# Patient Record
Sex: Female | Born: 1990
Health system: Southern US, Community
[De-identification: ages and names within clinical notes are randomized; demographics above are authoritative.]

## PROBLEM LIST (undated history)

## (undated) DIAGNOSIS — R519 Headache, unspecified: Secondary | ICD-10-CM

## (undated) DIAGNOSIS — R42 Dizziness and giddiness: Secondary | ICD-10-CM

## (undated) DIAGNOSIS — Z8619 Personal history of other infectious and parasitic diseases: Secondary | ICD-10-CM

## (undated) DIAGNOSIS — E282 Polycystic ovarian syndrome: Secondary | ICD-10-CM

## (undated) DIAGNOSIS — E039 Hypothyroidism, unspecified: Secondary | ICD-10-CM

## (undated) DIAGNOSIS — K589 Irritable bowel syndrome without diarrhea: Secondary | ICD-10-CM

## (undated) DIAGNOSIS — N390 Urinary tract infection, site not specified: Secondary | ICD-10-CM

## (undated) DIAGNOSIS — K52839 Microscopic colitis, unspecified: Secondary | ICD-10-CM

## (undated) DIAGNOSIS — K760 Fatty (change of) liver, not elsewhere classified: Secondary | ICD-10-CM

## (undated) HISTORY — DX: Polycystic ovarian syndrome: E28.2

## (undated) HISTORY — DX: Fatty (change of) liver, not elsewhere classified: K76.0

## (undated) HISTORY — DX: Personal history of other infectious and parasitic diseases: Z86.19

## (undated) HISTORY — DX: Microscopic colitis, unspecified: K52.839

## (undated) HISTORY — DX: Irritable bowel syndrome, unspecified: K58.9

## (undated) HISTORY — PX: TONSILLECTOMY: SUR1361

## (undated) HISTORY — PX: WISDOM TOOTH EXTRACTION: SHX21

---
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
## 2010-04-11 ENCOUNTER — Emergency Department (HOSPITAL_COMMUNITY): Admission: EM | Admit: 2010-04-11 | Discharge: 2010-04-11 | Payer: Self-pay | Admitting: Emergency Medicine

## 2011-03-21 LAB — DIFFERENTIAL
Basophils Absolute: 0 10*3/uL (ref 0.0–0.1)
Basophils Relative: 0 % (ref 0–1)
Eosinophils Absolute: 0.1 10*3/uL (ref 0.0–0.7)
Monocytes Relative: 5 % (ref 3–12)
Neutrophils Relative %: 89 % — ABNORMAL HIGH (ref 43–77)

## 2011-03-21 LAB — URINALYSIS, ROUTINE W REFLEX MICROSCOPIC
Bilirubin Urine: NEGATIVE
Glucose, UA: NEGATIVE mg/dL
Ketones, ur: NEGATIVE mg/dL
Leukocytes, UA: NEGATIVE
Nitrite: NEGATIVE
Specific Gravity, Urine: 1.027 (ref 1.005–1.030)
pH: 5 (ref 5.0–8.0)

## 2011-03-21 LAB — GC/CHLAMYDIA PROBE AMP, GENITAL: Chlamydia, DNA Probe: NEGATIVE

## 2011-03-21 LAB — POCT PREGNANCY, URINE: Preg Test, Ur: NEGATIVE

## 2011-03-21 LAB — COMPREHENSIVE METABOLIC PANEL
ALT: 20 U/L (ref 0–35)
Alkaline Phosphatase: 57 U/L (ref 39–117)
BUN: 10 mg/dL (ref 6–23)
CO2: 27 mEq/L (ref 19–32)
Chloride: 106 mEq/L (ref 96–112)
GFR calc non Af Amer: >60 mL/min (ref >60)
Glucose, Bld: 89 mg/dL (ref 70–99)
Potassium: 4 mEq/L (ref 3.5–5.1)
Sodium: 139 mEq/L (ref 135–145)
Total Bilirubin: 0.6 mg/dL (ref 0.3–1.2)
Total Protein: 7.2 g/dL (ref 6.0–8.3)

## 2011-03-21 LAB — CBC
HCT: 40.7 % (ref 36.0–46.0)
Hemoglobin: 14 g/dL (ref 12.0–15.0)
RBC: 4.47 MIL/uL (ref 3.87–5.11)
WBC: 10.1 10*3/uL (ref 4.0–10.5)

## 2011-03-21 LAB — URINE MICROSCOPIC-ADD ON

## 2011-11-20 LAB — OB RESULTS CONSOLE ABO/RH: RH Type: POSITIVE

## 2011-11-20 LAB — OB RESULTS CONSOLE HIV ANTIBODY (ROUTINE TESTING): HIV: NONREACTIVE

## 2011-11-20 LAB — OB RESULTS CONSOLE RPR: RPR: NONREACTIVE

## 2011-12-05 LAB — OB RESULTS CONSOLE GC/CHLAMYDIA
Chlamydia: NEGATIVE
Gonorrhea: NEGATIVE

## 2012-01-01 NOTE — L&D Delivery Note (Signed)
Delivery Note At 9:59 PM a viable female was delivered via Vaginal, Spontaneous Delivery (Presentation: Right Occiput Posterior).  APGAR: 8, 9; weight P.   Placenta status: Intact, Spontaneous.  Cord: 3 vessels with the following complications: None.    Anesthesia: General  Episiotomy: None Lacerations: Labial Suture Repair: 3.0 vicryl rapide Est. Blood Loss (mL):   Mom to postpartum.  Baby to nursery-stable.  [name],Joshus Rogan 05/09/2012, 10:51 PM   A+/Br/ POPs

## 2012-04-22 LAB — OB RESULTS CONSOLE GBS: GBS: POSITIVE

## 2012-05-08 ENCOUNTER — Encounter (HOSPITAL_COMMUNITY): Payer: Self-pay | Admitting: *Deleted

## 2012-05-08 ENCOUNTER — Inpatient Hospital Stay (HOSPITAL_COMMUNITY)
Admission: AD | Admit: 2012-05-08 | Discharge: 2012-05-08 | Disposition: A | Discharge status: Home / Self Care | Payer: 59 | Source: Ambulatory Visit | Attending: Obstetrics and Gynecology | Admitting: Obstetrics and Gynecology

## 2012-05-08 ENCOUNTER — Inpatient Hospital Stay (HOSPITAL_COMMUNITY)
Admission: AD | Admit: 2012-05-08 | Discharge: 2012-05-09 | Disposition: A | Discharge status: Home / Self Care | Payer: 59 | Source: Ambulatory Visit | Attending: Obstetrics and Gynecology | Admitting: Obstetrics and Gynecology

## 2012-05-08 DIAGNOSIS — O99892 Other specified diseases and conditions complicating childbirth: Secondary | ICD-10-CM | POA: Diagnosis present

## 2012-05-08 DIAGNOSIS — Z22330 Carrier of Group B streptococcus: Secondary | ICD-10-CM

## 2012-05-08 DIAGNOSIS — O479 False labor, unspecified: Secondary | ICD-10-CM | POA: Insufficient documentation

## 2012-05-08 HISTORY — DX: Dizziness and giddiness: R42

## 2012-05-08 HISTORY — DX: Urinary tract infection, site not specified: N39.0

## 2012-05-08 LAB — URINALYSIS, ROUTINE W REFLEX MICROSCOPIC
Glucose, UA: NEGATIVE mg/dL
Ketones, ur: 40 mg/dL — AB
Protein, ur: NEGATIVE mg/dL
Urobilinogen, UA: 4 mg/dL — ABNORMAL HIGH (ref 0.0–1.0)

## 2012-05-08 LAB — URINE MICROSCOPIC-ADD ON

## 2012-05-08 MED ORDER — NITROFURANTOIN MONOHYD MACRO 100 MG PO CAPS
100.0000 mg | ORAL_CAPSULE | Freq: Once | ORAL | Status: AC
  Administered 2012-05-08: 100 mg via ORAL
  Filled 2012-05-08: qty 1

## 2012-05-08 MED ORDER — SULFAMETHOXAZOLE-TMP DS 800-160 MG PO TABS
1.0000 | ORAL_TABLET | Freq: Once | ORAL | Status: AC
  Administered 2012-05-08: 1 via ORAL
  Filled 2012-05-08: qty 1

## 2012-05-08 MED ORDER — OXYCODONE-ACETAMINOPHEN 5-325 MG PO TABS
1.0000 | ORAL_TABLET | Freq: Once | ORAL | Status: AC
  Administered 2012-05-08: 1 via ORAL
  Filled 2012-05-08: qty 1

## 2012-05-08 NOTE — MAU Note (Signed)
Pt. Vomited after Bactrim was given.

## 2012-05-08 NOTE — MAU Note (Signed)
Lost mucous plug last wk.  Was one cm on Mon.  Contracting through the night. Past hour 2-3 min.

## 2012-05-08 NOTE — Discharge Instructions (Signed)
Fetal Movement Counts Patient Name: ____[name]______________________________________________ Patient Due Date: ____5/25/13________________ Melody Haver counts is highly recommended in high risk pregnancies, but it is a good idea for every pregnant woman to do. Start counting fetal movements at 28 weeks of the pregnancy. Fetal movements increase after eating a full meal or eating or drinking something sweet (the blood sugar is higher). It is also important to drink plenty of fluids (well hydrated) before doing the count. Lie on your left side because it helps with the circulation or you can sit in a comfortable chair with your arms over your belly (abdomen) with no distractions around you. DOING THE COUNT  Try to do the count the same time of day each time you do it.   Mark the day and time, then see how long it takes for you to feel 10 movements (kicks, flutters, swishes, rolls). You should have at least 10 movements within 2 hours. You will most likely feel 10 movements in much less than 2 hours. If you do not, wait an hour and count again. After a couple of days you will see a pattern.   What you are looking for is a change in the pattern or not enough counts in 2 hours. Is it taking longer in time to reach 10 movements?  SEEK MEDICAL CARE IF:  You feel less than 10 counts in 2 hours. Tried twice.   No movement in one hour.   The pattern is changing or taking longer each day to reach 10 counts in 2 hours.   You feel the baby is not moving as it usually does.  Date: ____________ Movements: ____________ Start time: ____________ [name] time: ____________  Date: ____________ Movements: ____________ Start time: ____________ [name] time: ____________ Date: ____________ Movements: ____________ Start time: ____________ [name] time: ____________ Date: ____________ Movements: ____________ Start time: ____________ [name] time: ____________ Date: ____________ Movements: ____________ Start time:  ____________ [name] time: ____________ Date: ____________ Movements: ____________ Start time: ____________ [name] time: ____________ Date: ____________ Movements: ____________ Start time: ____________ [name] time: ____________ Date: ____________ Movements: ____________ Start time: ____________ [name] time: ____________  Date: ____________ Movements: ____________ Start time: ____________ [name] time: ____________ Date: ____________ Movements: ____________ Start time: ____________ [name] time: ____________ Date: ____________ Movements: ____________ Start time: ____________ [name] time: ____________ Date: ____________ Movements: ____________ Start time: ____________ [name] time: ____________ Date: ____________ Movements: ____________ Start time: ____________ [name] time: ____________ Date: ____________ Movements: ____________ Start time: ____________ [name] time: ____________ Date: ____________ Movements: ____________ Start time: ____________ [name] time: ____________  Date: ____________ Movements: ____________ Start time: ____________ [name] time: ____________ Date: ____________ Movements: ____________ Start time: ____________ [name] time: ____________ Date: ____________ Movements: ____________ Start time: ____________ [name] time: ____________ Date: ____________ Movements: ____________ Start time: ____________ [name] time: ____________ Date: ____________ Movements: ____________ Start time: ____________ [name] time: ____________ Date: ____________ Movements: ____________ Start time: ____________ [name] time: ____________ Date: ____________ Movements: ____________ Start time: ____________ [name] time: ____________  Date: ____________ Movements: ____________ Start time: ____________ [name] time: ____________ Date: ____________ Movements: ____________ Start time: ____________ [name] time: ____________ Date: ____________ Movements: ____________ Start time: ____________ [name] time: ____________ Date:  ____________ Movements: ____________ Start time: ____________ [name] time: ____________ Date: ____________ Movements: ____________ Start time: ____________ [name] time: ____________ Date: ____________ Movements: ____________ Start time: ____________ [name] time: ____________ Date: ____________ Movements: ____________ Start time: ____________ [name] time: ____________  Date: ____________ Movements: ____________ Start time: ____________ [name] time: ____________ Date: ____________ Movements: ____________ Start time: ____________ [name] time: ____________ Date: ____________ Movements: ____________ Start  time: ____________ [name] time: ____________ Date: ____________ Movements: ____________ Start time: ____________ [name] time: ____________ Date: ____________ Movements: ____________ Start time: ____________ [name] time: ____________ Date: ____________ Movements: ____________ Start time: ____________ [name] time: ____________ Date: ____________ Movements: ____________ Start time: ____________ [name] time: ____________  Date: ____________ Movements: ____________ Start time: ____________ [name] time: ____________ Date: ____________ Movements: ____________ Start time: ____________ [name] time: ____________ Date: ____________ Movements: ____________ Start time: ____________ [name] time: ____________ Date: ____________ Movements: ____________ Start time: ____________ [name] time: ____________ Date: ____________ Movements: ____________ Start time: ____________ [name] time: ____________ Date: ____________ Movements: ____________ Start time: ____________ [name] time: ____________ Date: ____________ Movements: ____________ Start time: ____________ [name] time: ____________  Date: ____________ Movements: ____________ Start time: ____________ [name] time: ____________ Date: ____________ Movements: ____________ Start time: ____________ [name] time: ____________ Date: ____________ Movements: ____________ Start  time: ____________ [name] time: ____________ Date: ____________ Movements: ____________ Start time: ____________ [name] time: ____________ Date: ____________ Movements: ____________ Start time: ____________ [name] time: ____________ Date: ____________ Movements: ____________ Start time: ____________ [name] time: ____________ Date: ____________ Movements: ____________ Start time: ____________ [name] time: ____________  Date: ____________ Movements: ____________ Start time: ____________ [name] time: ____________ Date: ____________ Movements: ____________ Start time: ____________ [name] time: ____________ Date: ____________ Movements: ____________ Start time: ____________ [name] time: ____________ Date: ____________ Movements: ____________ Start time: ____________ [name] time: ____________ Date: ____________ Movements: ____________ Start time: ____________ [name] time: ____________ Date: ____________ Movements: ____________ Start time: ____________ [name] time: ____________ Document Released: 01/16/2007 Document Revised: 12/06/2011 Document Reviewed: 07/19/2009 [employer] Patient Information 2012 [employer].Normal Labor and Delivery Your caregiver must first be sure you are in labor. Signs of labor include:  You may pass what is called "the mucus plug" before labor begins. This is a small amount of blood stained mucus.   Regular uterine contractions.   The time between contractions get closer together.   The discomfort and pain gradually gets more intense.   Pains are mostly located in the back.   Pains get worse when walking.   The cervix (the opening of the uterus becomes thinner (begins to efface) and opens up (dilates).  Once you are in labor and admitted into the hospital or care center, your caregiver will do the following:  A complete physical examination.   Check your vital signs (blood pressure, pulse, temperature and the fetal heart rate).   Do a vaginal examination (using  a sterile glove and lubricant) to determine:   The position (presentation) of the baby (head [vertex] or buttock first).   The level (station) of the baby's head in the birth canal.   The effacement and dilatation of the cervix.   You may have your pubic hair shaved and be given an enema depending on your caregiver and the circumstance.   An electronic monitor is usually placed on your abdomen. The monitor follows the length and intensity of the contractions, as well as the baby's heart rate.   Usually, your caregiver will insert an IV in your arm with a bottle of sugar water. This is done as a precaution so that medications can be given to you quickly during labor or delivery.  NORMAL LABOR AND DELIVERY IS DIVIDED UP INTO 3 STAGES: First Stage This is when regular contractions begin and the cervix begins to efface and dilate. This stage can last from 3 to 15 hours. The end of the first stage is when the cervix is 100% effaced and 10 centimeters dilated. Pain medications may be given by   Injection (morphine,  demerol, etc.)   Regional anesthesia (spinal, caudal or epidural, anesthetics given in different locations of the spine). Paracervical pain medication may be given, which is an injection of and anesthetic on each side of the cervix.  A pregnant woman may request to have "Natural Childbirth" which is not to have any medications or anesthesia during her labor and delivery. Second Stage This is when the baby comes down through the birth canal (vagina) and is born. This can take 1 to 4 hours. As the baby's head comes down through the birth canal, you may feel like you are going to have a bowel movement. You will get the urge to bear down and push until the baby is delivered. As the baby's head is being delivered, the caregiver will decide if an episiotomy (a cut in the perineum and vagina area) is needed to prevent tearing of the tissue in this area. The episiotomy is sewn up after the  delivery of the baby and placenta. Sometimes a mask with nitrous oxide is given for the mother to breath during the delivery of the baby to help if there is too much pain. The end of Stage 2 is when the baby is fully delivered. Then when the umbilical cord stops pulsating it is clamped and cut. Third Stage The third stage begins after the baby is completely delivered and ends after the placenta (afterbirth) is delivered. This usually takes 5 to 30 minutes. After the placenta is delivered, a medication is given either by intravenous or injection to help contract the uterus and prevent bleeding. The third stage is not painful and pain medication is usually not necessary. If an episiotomy was done, it is repaired at this time. After the delivery, the mother is watched and monitored closely for 1 to 2 hours to make sure there is no postpartum bleeding (hemorrhage). If there is a lot of bleeding, medication is given to contract the uterus and stop the bleeding. Document Released: 09/25/2008 Document Revised: 12/06/2011 Document Reviewed: 09/25/2008 [facility] Patient Information 2012 [place], [place].

## 2012-05-09 ENCOUNTER — Encounter (HOSPITAL_COMMUNITY): Payer: Self-pay | Admitting: Anesthesiology

## 2012-05-09 ENCOUNTER — Encounter (HOSPITAL_COMMUNITY): Payer: Self-pay | Admitting: Obstetrics and Gynecology

## 2012-05-09 ENCOUNTER — Encounter (HOSPITAL_COMMUNITY): Payer: Self-pay | Admitting: *Deleted

## 2012-05-09 ENCOUNTER — Inpatient Hospital Stay (HOSPITAL_COMMUNITY)
Admission: AD | Admit: 2012-05-09 | Discharge: 2012-05-11 | DRG: 775 | Disposition: A | Discharge status: Home / Self Care | Payer: 59 | Source: Ambulatory Visit | Attending: Obstetrics and Gynecology | Admitting: Obstetrics and Gynecology

## 2012-05-09 ENCOUNTER — Inpatient Hospital Stay (HOSPITAL_COMMUNITY): Payer: 59 | Admitting: Anesthesiology

## 2012-05-09 LAB — CBC
HCT: 37 % (ref 36.0–46.0)
Hemoglobin: 12.5 g/dL (ref 12.0–15.0)
MCHC: 33.8 g/dL (ref 30.0–36.0)
RBC: 4.13 MIL/uL (ref 3.87–5.11)

## 2012-05-09 MED ORDER — ONDANSETRON HCL 4 MG/2ML IJ SOLN: 4.0000 mg | Freq: Four times a day (QID) | INTRAMUSCULAR | Status: DC | PRN

## 2012-05-09 MED ORDER — BUTORPHANOL TARTRATE 2 MG/ML IJ SOLN
1.0000 mg | INTRAMUSCULAR | Status: DC | PRN
  Administered 2012-05-09: 1 mg via INTRAVENOUS
  Filled 2012-05-09: qty 1

## 2012-05-09 MED ORDER — DIPHENHYDRAMINE HCL 50 MG/ML IJ SOLN: 12.5000 mg | INTRAMUSCULAR | Status: DC | PRN

## 2012-05-09 MED ORDER — BUTORPHANOL TARTRATE 2 MG/ML IJ SOLN
2.0000 mg | Freq: Once | INTRAMUSCULAR | Status: AC
  Administered 2012-05-09: 2 mg via INTRAMUSCULAR
  Filled 2012-05-09: qty 1

## 2012-05-09 MED ORDER — BUTORPHANOL TARTRATE 2 MG/ML IJ SOLN: 2.0000 mg | Freq: Once | INTRAMUSCULAR | Status: DC

## 2012-05-09 MED ORDER — OXYTOCIN BOLUS FROM INFUSION
500.0000 mL | Freq: Once | INTRAVENOUS | Status: DC
  Filled 2012-05-09: qty 500

## 2012-05-09 MED ORDER — FENTANYL 2.5 MCG/ML BUPIVACAINE 1/10 % EPIDURAL INFUSION (WH - ANES)
INTRAMUSCULAR | Status: DC | PRN
  Administered 2012-05-09: 14 mL/h via EPIDURAL

## 2012-05-09 MED ORDER — FENTANYL 2.5 MCG/ML BUPIVACAINE 1/10 % EPIDURAL INFUSION (WH - ANES)
14.0000 mL/h | INTRAMUSCULAR | Status: DC
  Administered 2012-05-09 (×2): 14 mL/h via EPIDURAL
  Filled 2012-05-09 (×2): qty 60

## 2012-05-09 MED ORDER — PHENYLEPHRINE 40 MCG/ML (10ML) SYRINGE FOR IV PUSH (FOR BLOOD PRESSURE SUPPORT)
80.0000 ug | PREFILLED_SYRINGE | INTRAVENOUS | Status: AC | PRN
  Administered 2012-05-09 (×3): 80 ug via INTRAVENOUS
  Filled 2012-05-09 (×2): qty 5

## 2012-05-09 MED ORDER — LACTATED RINGERS IV SOLN
500.0000 mL | INTRAVENOUS | Status: DC | PRN
  Administered 2012-05-09: 500 mL via INTRAVENOUS

## 2012-05-09 MED ORDER — LIDOCAINE HCL (PF) 1 % IJ SOLN
INTRAMUSCULAR | Status: DC | PRN
  Administered 2012-05-09 (×2): 4 mL

## 2012-05-09 MED ORDER — PHENYLEPHRINE 40 MCG/ML (10ML) SYRINGE FOR IV PUSH (FOR BLOOD PRESSURE SUPPORT)
80.0000 ug | PREFILLED_SYRINGE | INTRAVENOUS | Status: DC | PRN
  Administered 2012-05-09: 120 ug via INTRAVENOUS

## 2012-05-09 MED ORDER — ACETAMINOPHEN 325 MG PO TABS
650.0000 mg | ORAL_TABLET | ORAL | Status: DC | PRN
  Administered 2012-05-09: 650 mg via ORAL
  Filled 2012-05-09: qty 2

## 2012-05-09 MED ORDER — TERBUTALINE SULFATE 1 MG/ML IJ SOLN: 0.2500 mg | Freq: Once | INTRAMUSCULAR | Status: AC | PRN

## 2012-05-09 MED ORDER — CEFAZOLIN SODIUM 1-5 GM-% IV SOLN: 1.0000 g | Freq: Three times a day (TID) | INTRAVENOUS | Status: DC

## 2012-05-09 MED ORDER — OXYTOCIN 20 UNITS IN LACTATED RINGERS INFUSION - SIMPLE
125.0000 mL/h | Freq: Once | INTRAVENOUS | Status: AC
  Administered 2012-05-09: 999 mL/h via INTRAVENOUS

## 2012-05-09 MED ORDER — OXYCODONE-ACETAMINOPHEN 5-325 MG PO TABS: 1.0000 | ORAL_TABLET | ORAL | Status: DC | PRN

## 2012-05-09 MED ORDER — LACTATED RINGERS IV SOLN
INTRAVENOUS | Status: DC
  Administered 2012-05-09: 20:00:00 via INTRAVENOUS

## 2012-05-09 MED ORDER — CEFAZOLIN SODIUM-DEXTROSE 2-3 GM-% IV SOLR
2.0000 g | Freq: Three times a day (TID) | INTRAVENOUS | Status: DC
  Administered 2012-05-09 (×2): 2 g via INTRAVENOUS
  Filled 2012-05-09 (×4): qty 50

## 2012-05-09 MED ORDER — EPHEDRINE 5 MG/ML INJ: 10.0000 mg | INTRAVENOUS | Status: DC | PRN

## 2012-05-09 MED ORDER — FLEET ENEMA 7-19 GM/118ML RE ENEM: 1.0000 | ENEMA | RECTAL | Status: DC | PRN

## 2012-05-09 MED ORDER — LACTATED RINGERS IV SOLN
500.0000 mL | Freq: Once | INTRAVENOUS | Status: AC
  Administered 2012-05-09: 16:00:00 via INTRAVENOUS

## 2012-05-09 MED ORDER — PHENYLEPHRINE 40 MCG/ML (10ML) SYRINGE FOR IV PUSH (FOR BLOOD PRESSURE SUPPORT)
PREFILLED_SYRINGE | INTRAVENOUS | Status: AC
  Administered 2012-05-09: 80 ug via INTRAVENOUS
  Filled 2012-05-09: qty 5

## 2012-05-09 MED ORDER — EPHEDRINE 5 MG/ML INJ
10.0000 mg | INTRAVENOUS | Status: DC | PRN
  Filled 2012-05-09: qty 4

## 2012-05-09 MED ORDER — LIDOCAINE HCL (PF) 1 % IJ SOLN
30.0000 mL | INTRAMUSCULAR | Status: DC | PRN
  Filled 2012-05-09: qty 30

## 2012-05-09 MED ORDER — OXYTOCIN 20 UNITS IN LACTATED RINGERS INFUSION - SIMPLE
1.0000 m[IU]/min | INTRAVENOUS | Status: DC
  Administered 2012-05-09: 2 m[IU]/min via INTRAVENOUS
  Filled 2012-05-09: qty 1000

## 2012-05-09 MED ORDER — CEFAZOLIN SODIUM-DEXTROSE 2-3 GM-% IV SOLR: 2.0000 g | Freq: Once | INTRAVENOUS | Status: DC

## 2012-05-09 MED ORDER — IBUPROFEN 600 MG PO TABS: 600.0000 mg | ORAL_TABLET | Freq: Four times a day (QID) | ORAL | Status: DC | PRN

## 2012-05-09 MED ORDER — CITRIC ACID-SODIUM CITRATE 334-500 MG/5ML PO SOLN: 30.0000 mL | ORAL | Status: DC | PRN

## 2012-05-09 MED ORDER — BUTORPHANOL TARTRATE 2 MG/ML IJ SOLN: 2.0000 mg | INTRAMUSCULAR | Status: DC | PRN

## 2012-05-09 NOTE — Anesthesia Preprocedure Evaluation (Signed)
Anesthesia Evaluation  Patient identified by MRN, date of birth, ID band Patient awake    Reviewed: Allergy & Precautions, H&P , Patient's Chart, lab work & pertinent test results  Airway Mallampati: II TM Distance: >3 FB Neck ROM: full    Dental No notable dental hx. (+) Teeth Intact   Pulmonary neg pulmonary ROS,  breath sounds clear to auscultation  Pulmonary exam normal       Cardiovascular negative cardio ROS  Rhythm:regular Rate:Normal     Neuro/Psych negative neurological ROS  negative psych ROS   GI/Hepatic negative GI ROS, Neg liver ROS,   Endo/Other  negative endocrine ROS  Renal/GU negative Renal ROS  negative genitourinary   Musculoskeletal   Abdominal Normal abdominal exam  (+)   Peds  Hematology negative hematology ROS (+)   Anesthesia Other Findings   Reproductive/Obstetrics (+) Pregnancy                           Anesthesia Physical Anesthesia Plan  ASA: II  Anesthesia Plan: Epidural   Post-op Pain Management:    Induction:   Airway Management Planned:   Additional Equipment:   Intra-op Plan:   Post-operative Plan:   Informed Consent: I have reviewed the patients History and Physical, chart, labs and discussed the procedure including the risks, benefits and alternatives for the proposed anesthesia with the patient or authorized representative who has indicated his/her understanding and acceptance.     Plan Discussed with: Anesthesiologist  Anesthesia Plan Comments:         Anesthesia Quick Evaluation  

## 2012-05-09 NOTE — Anesthesia Procedure Notes (Signed)
Epidural Patient location during procedure: OB Start time: 05/09/2012 3:41 PM  Staffing Anesthesiologist: Glennie Rodda A. Performed by: anesthesiologist   Preanesthetic Checklist Completed: patient identified, site marked, surgical consent, pre-op evaluation, timeout performed, IV checked, risks and benefits discussed and monitors and equipment checked  Epidural Patient position: sitting Prep: site prepped and draped and DuraPrep Patient monitoring: continuous pulse ox and blood pressure Approach: midline Injection technique: LOR air  Needle:  Needle type: Tuohy  Needle gauge: 17 G Needle length: 9 cm Needle insertion depth: 5 cm cm Catheter type: closed end flexible Catheter size: 19 Gauge Catheter at skin depth: 10 cm Test dose: negative and Other  Assessment Events: blood not aspirated, injection not painful, no injection resistance, negative IV test and no paresthesia  Additional Notes Patient identified. Risks and benefits discussed including failed block, incomplete  Pain control, post dural puncture headache, nerve damage, paralysis, blood pressure Changes, nausea, vomiting, reactions to medications-both toxic and allergic and post Partum back pain. All questions were answered. Patient expressed understanding and wished to proceed. Sterile technique was used throughout procedure. Epidural site was Dressed with sterile barrier dressing. No paresthesias, signs of intravascular injection Or signs of intrathecal spread were encountered.  Patient was more comfortable after the epidural was dosed. Please see RN's note for documentation of vital signs and FHR which are stable.

## 2012-05-09 NOTE — Progress Notes (Signed)
MD notified of FHR decel, FHR now up to 120-130, more bleeding noted with SVE, SVE unchanged... MD wants to observe pt another hour.

## 2012-05-09 NOTE — Progress Notes (Signed)
[name] is a 21 y.o. G1P0000 at [redacted]w[redacted]d admitted for rupture of membranes  Subjective: Comfortable with epidural  Objective: BP 106/48  Pulse 127  Temp(Src) 99 F (37.2 C) (Oral)  Resp 18  Ht 5\' 6"  (1.676 m)  Wt 79.379 kg (175 lb)  BMI 28.25 kg/m2      FHT:  FHR: 135-140 bpm, variability: minimal ,  accelerations:  Abscent,  decelerations:  Absent UC:   irregular, every 2-3 minutes SVE:   Dilation: 5 Effacement (%): 100 Station: 0 Exam by:: lee  Labs: Lab Results  Component Value Date   WBC 28.2* 05/09/2012   HGB 12.5 05/09/2012   HCT 37.0 05/09/2012   MCV 89.6 05/09/2012   PLT 272 05/09/2012    Assessment / Plan: Augmentation of labor, progressing well  Labor: Progressing normally Preeclampsia:  no signs or symptoms of toxicity Fetal Wellbeing:  Category II Pain Control:  Epidural I/D:  n/a Anticipated MOD:  NSVD  [name] 05/09/2012, 5:16 PM

## 2012-05-09 NOTE — Progress Notes (Signed)
MD called back, wants to observe pt., order for pain med received.

## 2012-05-09 NOTE — MAU Note (Signed)
Tearful.  Ctx's still every 5, states already been back once, was dx with UTI.  Much more uncomfortable now.

## 2012-05-09 NOTE — MAU Note (Signed)
Pt seen yesterday in MAU for labor eval & last night for UTI, states uc's are worse, unable to sleep, vomitting.  Pt states she has had some bleeding, no LOF.

## 2012-05-09 NOTE — Progress Notes (Signed)
Message left for Dr. Ellyn Hack with nurse re pt status... SVE 3/90/-1.  Dr. Ellyn Hack to call back.

## 2012-05-09 NOTE — Progress Notes (Signed)
MD notified of SROM, clear fluid... Admit orders received.

## 2012-05-09 NOTE — Progress Notes (Signed)
FHT from 5-9 reviewed.  Reactive NST, no significant decels, irreg ctx.

## 2012-05-09 NOTE — H&P (Signed)
[name] is a 21 y.o. female G1P0 at 57+ with ROM, clear fluid.  Has had ctx for few days, uncomfortable.  +FM, no VB; uncomplicated PNC, except somewhat late to [facility] about 13 weeks and GBBS + - PCN allergic, not suspetible, will use Ancef Maternal Medical History:  Reason for admission: Reason for admission: rupture of membranes and contractions.  Contractions: Frequency: regular.   Perceived severity is moderate.    Fetal activity: Perceived fetal activity is normal.      OB History    Grav Para Term Preterm Abortions TAB SAB Ect Mult Living   1 0 0 0 0 0 0 0 0 0     G1 present, no abn pap, h/o trich Past Medical History  Diagnosis Date  . Urinary tract infection   . Vertigo    Past Surgical History  Procedure Date  . Tonsillectomy     adenoidectomy  . Wisdom tooth extraction    Family History: family history includes Diabetes in her maternal grandmother.  There is no history of Anesthesia problems. Social History:  reports that she has never smoked. She has never used smokeless tobacco. She reports that she does not drink alcohol or use illicit drugs. Meds PNV All: PCN, erythro  Review of Systems  Constitutional: Negative.   HENT: Negative.   Eyes: Negative.   Respiratory: Negative.   Cardiovascular: Negative.   Gastrointestinal: Negative.   Genitourinary: Negative.   Musculoskeletal: Negative.   Skin: Negative.   Neurological: Negative.   Psychiatric/Behavioral: Negative.     Dilation: 3 Effacement (%): 90 Station: -1 Exam by:: Dorrene German RN Blood pressure 122/65, pulse 66, temperature 98.5 F (36.9 C), temperature source Oral, resp. rate 16, height 5\' 6"  (1.676 m), weight 79.379 kg (175 lb). Maternal Exam:  Abdomen: Fundal height is appropriate for gestation.   Estimated fetal weight is 7#.   Fetal presentation: vertex     Physical Exam  Constitutional: She is oriented to person, place, and time. She appears well-developed and well-nourished.  HENT:   Head: Normocephalic and atraumatic.  Neck: Normal range of motion. Neck supple.  Cardiovascular: Normal rate and regular rhythm.   Respiratory: Effort normal and breath sounds normal. No respiratory distress.  GI: Soft. Bowel sounds are normal. There is no tenderness.  Musculoskeletal: Normal range of motion.  Neurological: She is alert and oriented to person, place, and time.  Skin: Skin is warm and dry.  Psychiatric: She has a normal mood and affect. Her behavior is normal.    Prenatal labs: ABO, Rh: A/Positive/-- (11/20 0000) Antibody: Negative (11/20 0000) Rubella: Immune (11/20 0000) RPR: Nonreactive (11/20 0000)  HBsAg: Negative (11/20 0000)  HIV: Non-reactive (11/20 0000)  GBS: Positive (04/23 0000)  hgb 12.2/Pap WNL/ GC neg/ Chl neg/ Plt 254 K/ First Tri screen and AFP WNL/ CF neg  [name] nl anat, ant plac, female, previa resolved Assessment/Plan: 21yo G1P0 at 37+ with SROM  Augmentation with pitocin GBBS + - Ancef for prophylaxis Expect SVD Epidural for pain   [name],Chasitie Passey 05/09/2012, 2:03 PM

## 2012-05-09 NOTE — Progress Notes (Signed)
MD states she is reviewing strip, variability minimal @ times even with O2, O2 is off @ present, FHR @ 160.  Has been reassuring @ times but not reactive.  MD requests pt continue to be observed.

## 2012-05-10 LAB — CBC
MCH: 29.7 pg (ref 26.0–34.0)
MCHC: 33.5 g/dL (ref 30.0–36.0)
Platelets: 207 10*3/uL (ref 150–400)
RDW: 13 % (ref 11.5–15.5)

## 2012-05-10 MED ORDER — WITCH HAZEL-GLYCERIN EX PADS: 1.0000 "application " | MEDICATED_PAD | CUTANEOUS | Status: DC | PRN

## 2012-05-10 MED ORDER — TETANUS-DIPHTH-ACELL PERTUSSIS 5-2.5-18.5 LF-MCG/0.5 IM SUSP
0.5000 mL | Freq: Once | INTRAMUSCULAR | Status: AC
  Administered 2012-05-10: 0.5 mL via INTRAMUSCULAR
  Filled 2012-05-10: qty 0.5

## 2012-05-10 MED ORDER — ONDANSETRON HCL 4 MG PO TABS
4.0000 mg | ORAL_TABLET | ORAL | Status: DC | PRN
  Administered 2012-05-10: 4 mg via ORAL
  Filled 2012-05-10: qty 1

## 2012-05-10 MED ORDER — DIBUCAINE 1 % RE OINT: 1.0000 "application " | TOPICAL_OINTMENT | RECTAL | Status: DC | PRN

## 2012-05-10 MED ORDER — DIPHENHYDRAMINE HCL 25 MG PO CAPS: 25.0000 mg | ORAL_CAPSULE | Freq: Four times a day (QID) | ORAL | Status: DC | PRN

## 2012-05-10 MED ORDER — ZOLPIDEM TARTRATE 5 MG PO TABS: 5.0000 mg | ORAL_TABLET | Freq: Every evening | ORAL | Status: DC | PRN

## 2012-05-10 MED ORDER — PRENATAL MULTIVITAMIN CH: 1.0000 | ORAL_TABLET | Freq: Every day | ORAL | Status: DC

## 2012-05-10 MED ORDER — OXYCODONE-ACETAMINOPHEN 5-325 MG PO TABS
1.0000 | ORAL_TABLET | ORAL | Status: DC | PRN
  Administered 2012-05-10: 1 via ORAL
  Filled 2012-05-10: qty 1

## 2012-05-10 MED ORDER — LACTATED RINGERS IV SOLN: INTRAVENOUS | Status: DC

## 2012-05-10 MED ORDER — SIMETHICONE 80 MG PO CHEW: 80.0000 mg | CHEWABLE_TABLET | ORAL | Status: DC | PRN

## 2012-05-10 MED ORDER — BENZOCAINE-MENTHOL 20-0.5 % EX AERO: 1.0000 "application " | INHALATION_SPRAY | CUTANEOUS | Status: DC | PRN

## 2012-05-10 MED ORDER — SENNOSIDES-DOCUSATE SODIUM 8.6-50 MG PO TABS
2.0000 | ORAL_TABLET | Freq: Every day | ORAL | Status: DC
  Administered 2012-05-10: 2 via ORAL

## 2012-05-10 MED ORDER — IBUPROFEN 600 MG PO TABS
600.0000 mg | ORAL_TABLET | Freq: Four times a day (QID) | ORAL | Status: DC
  Administered 2012-05-10 – 2012-05-11 (×5): 600 mg via ORAL
  Filled 2012-05-10 (×5): qty 1

## 2012-05-10 MED ORDER — ONDANSETRON HCL 4 MG/2ML IJ SOLN: 4.0000 mg | INTRAMUSCULAR | Status: DC | PRN

## 2012-05-10 MED ORDER — LANOLIN HYDROUS EX OINT: TOPICAL_OINTMENT | CUTANEOUS | Status: DC | PRN

## 2012-05-10 MED ORDER — PRENATAL MULTIVITAMIN CH
1.0000 | ORAL_TABLET | Freq: Every day | ORAL | Status: DC
  Administered 2012-05-10 – 2012-05-11 (×2): 1 via ORAL
  Filled 2012-05-10 (×3): qty 1

## 2012-05-10 NOTE — Anesthesia Postprocedure Evaluation (Signed)
Anesthesia Post Note  Patient: [name]  Procedure(s) Performed: * No procedures listed *  Anesthesia type: Epidural  Patient location: Mother/Baby  Post pain: Pain level controlled  Post assessment: Post-op Vital signs reviewed  Last Vitals:  Filed Vitals:   05/10/12 0830  BP: 95/61  Pulse: 99  Temp: 36.7 C  Resp: 18    Post vital signs: Reviewed  Level of consciousness: awake  Complications: No apparent anesthesia complications

## 2012-05-10 NOTE — Progress Notes (Signed)
Post Partum Day 1 Subjective: no complaints, up ad lib, tolerating PO and nl lochia, pain controlled  Objective: Blood pressure 95/61, pulse 99, temperature 98 F (36.7 C), temperature source Oral, resp. rate 18, height 5\' 6"  (1.676 m), weight 79.379 kg (175 lb), SpO2 96.00%, unknown if currently breastfeeding.  Physical Exam:  General: alert and no distress Lochia: appropriate Uterine Fundus: firm   Basename 05/10/12 0541 05/09/12 1247  HGB 9.1* 12.5  HCT 27.2* 37.0    Assessment/Plan: Plan for discharge tomorrow, Breastfeeding and Lactation consult.  routine care   LOS: 1 day   [name],Wildon Cuevas 05/10/2012, 12:36 PM

## 2012-05-11 MED ORDER — PRENATAL MULTIVITAMIN CH: 1.0000 | ORAL_TABLET | Freq: Every day | ORAL | Status: DC

## 2012-05-11 MED ORDER — IBUPROFEN 800 MG PO TABS: 800.0000 mg | ORAL_TABLET | Freq: Three times a day (TID) | ORAL | Status: AC | PRN

## 2012-05-11 MED ORDER — OXYCODONE-ACETAMINOPHEN 5-325 MG PO TABS: 1.0000 | ORAL_TABLET | Freq: Four times a day (QID) | ORAL | Status: AC | PRN

## 2012-05-11 NOTE — Progress Notes (Signed)
Post Partum Day 2 Subjective: no complaints, up ad lib, tolerating PO and pain controlled, nl lochia  Objective: Blood pressure 96/61, pulse 79, temperature 97.5 F (36.4 C), temperature source Oral, resp. rate 18, height 5\' 6"  (1.676 m), weight 79.379 kg (175 lb), SpO2 96.00%, unknown if currently breastfeeding.  Physical Exam:  General: alert and no distress Lochia: appropriate Uterine Fundus: firm   Basename 05/10/12 0541 05/09/12 1247  HGB 9.1* 12.5  HCT 27.2* 37.0    Assessment/Plan: Discharge home, Breastfeeding and Lactation consult  D/c home with motrin/percocet/pnv; f/u 6 weeks   LOS: 2 days   [name],Jenel Gierke 05/11/2012, 11:12 AM

## 2012-05-11 NOTE — Discharge Summary (Signed)
Obstetric Discharge Summary Reason for Admission: onset of labor and rupture of membranes Prenatal Procedures: none Intrapartum Procedures: spontaneous vaginal delivery Postpartum Procedures: none Complications-Operative and Postpartum: labial laceration Hemoglobin  Date Value Range Status  05/10/2012 9.1* 12.0-15.0 (g/dL) Final     DELTA CHECK NOTED     REPEATED TO VERIFY     HCT  Date Value Range Status  05/10/2012 27.2* 36.0-46.0 (%) Final    Physical Exam:  General: alert and no distress Lochia: appropriate Uterine Fundus: firm   Discharge Diagnoses: Term Pregnancy-delivered  Discharge Information: Date: 05/11/2012 Activity: pelvic rest Diet: routine Medications: PNV, Ibuprofen and Percocet Condition: stable Instructions: refer to practice specific booklet Discharge to: home Follow-up Information    Schedule an appointment as soon as possible for a visit with HannahAnushree Dorsi, MD.   Contact information:   [address] [facility] [address] [place] [address] [place] [address] [phone]          Newborn Data: Live born female  Birth Weight: 6 lb 3.7 oz (2825 g) APGAR: 8, 9  Home with mother.  HannahHannah Leach 05/11/2012, 11:17 AM

## 2012-05-12 NOTE — Progress Notes (Signed)
Post discharge chart review completed.  

## 2013-05-28 ENCOUNTER — Emergency Department (HOSPITAL_COMMUNITY)
Admission: EM | Admit: 2013-05-28 | Discharge: 2013-05-28 | Disposition: A | Discharge status: Home / Self Care | Payer: 59 | Source: Home / Self Care

## 2013-05-28 ENCOUNTER — Encounter (HOSPITAL_COMMUNITY): Payer: Self-pay | Admitting: Emergency Medicine

## 2013-05-28 DIAGNOSIS — J029 Acute pharyngitis, unspecified: Secondary | ICD-10-CM

## 2013-05-28 MED ORDER — ONDANSETRON 4 MG PO TBDP: 4.0000 mg | ORAL_TABLET | Freq: Three times a day (TID) | ORAL | Status: DC | PRN

## 2013-05-28 MED ORDER — CEPHALEXIN 500 MG PO CAPS: 500.0000 mg | ORAL_CAPSULE | Freq: Two times a day (BID) | ORAL | Status: DC

## 2013-05-28 MED ORDER — IBUPROFEN 600 MG PO TABS: 600.0000 mg | ORAL_TABLET | Freq: Three times a day (TID) | ORAL | Status: DC | PRN

## 2013-05-28 NOTE — ED Notes (Signed)
C/o sore throat , thinks she has strep.  Reports fever, severe throat pain and nausea

## 2013-05-28 NOTE — ED Provider Notes (Signed)
History     CSN: [id number]  Arrival date & time 05/28/13  1015   None     Chief Complaint  Patient presents with  . Sore Throat    (Consider location/radiation/quality/duration/timing/severity/associated sxs/prior treatment) HPI Comments: 22 year old female here complaining of sore throat associated with painful swallowing, headache, abdominal discomfort and 101 temperature for the last 2 days. Denies cough or congestion. No chest pain or difficulty breathing. No vomiting or diarrhea. No rashes. Patient and has a 45-year-old daughter and has been abstinent since the birth of her daughter. Not using other types of birth control.   Past Medical History  Diagnosis Date  . Urinary tract infection   . Vertigo   . SVD (spontaneous vaginal delivery) 05/09/2012    Past Surgical History  Procedure Laterality Date  . Tonsillectomy      adenoidectomy  . Wisdom tooth extraction      Family History  Problem Relation Age of Onset  . Anesthesia problems Neg Hx   . Diabetes Maternal Grandmother     History  Substance Use Topics  . Smoking status: Never Smoker   . Smokeless tobacco: Never Used  . Alcohol Use: No    OB History   Grav Para Term Preterm Abortions TAB SAB Ect Mult Living   1 1 1  0 0 0 0 0 0 1      Review of Systems  Constitutional: Positive for fever and appetite change. Negative for chills and diaphoresis.  HENT: Positive for sore throat and trouble swallowing. Negative for congestion and rhinorrhea.   Respiratory: Negative for cough, shortness of breath and wheezing.   Cardiovascular: Negative for chest pain.  Gastrointestinal: Positive for nausea. Negative for vomiting and diarrhea.  Skin: Negative for rash.  Neurological: Positive for headaches.    Allergies  Erythromycin and Penicillins  Home Medications   Current Outpatient Rx  Name  Route  Sig  Dispense  Refill  . acetaminophen (TYLENOL) 325 MG tablet   Oral   Take 650 mg by mouth every 6 (six)  hours as needed. headache         . cephALEXin (KEFLEX) 500 MG capsule   Oral   Take 1 capsule (500 mg total) by mouth 2 (two) times daily.   20 capsule   0   . ibuprofen (ADVIL,MOTRIN) 600 MG tablet   Oral   Take 1 tablet (600 mg total) by mouth every 8 (eight) hours as needed for pain.   21 tablet   0   . ondansetron (ZOFRAN-ODT) 4 MG disintegrating tablet   Oral   Take 1 tablet (4 mg total) by mouth every 8 (eight) hours as needed for nausea.   10 tablet   0   . Prenatal Vit-Fe Fumarate-FA (PRENATAL MULTIVITAMIN) TABS   Oral   Take 1 tablet by mouth daily.   30 tablet   12     BP 116/71  Pulse 103  Temp(Src) 99.7 F (37.6 C) (Oral)  Resp 16  SpO2 98%  Physical Exam  Nursing note and vitals reviewed. Constitutional: She is oriented to person, place, and time. She appears well-developed and well-nourished. No distress.  HENT:  Nose normal Significant pharyngeal erythema no exudates. No uvula deviation. No trismus. TM's normal  Eyes: Conjunctivae are normal. Right eye exhibits no discharge. Left eye exhibits no discharge. No scleral icterus.  Neck: Neck supple.  Cardiovascular: Normal rate, regular rhythm and normal heart sounds.   No murmur heard. Pulmonary/Chest: Effort normal and breath  sounds normal. No respiratory distress. She has no wheezes. She has no rales.  Abdominal: Soft. There is no tenderness.  No HSM  Lymphadenopathy:    She has cervical adenopathy.  Neurological: She is alert and oriented to person, place, and time.  Skin: No rash noted. She is not diaphoretic.    ED Course  Procedures (including critical care time)  Labs Reviewed - No data to display No results found.   1. Pharyngitis       MDM  Classic findings for strep pharyngitis. No rapid strep was done. Patient with reported allergies to penicillins. Prescribed keflex, ibuprofen and ondansetron. Supportive care and red flags that should prompt her return to medical  attention discussed with patient and provided in writing.    Sharin Grave, MD 05/30/13 1610

## 2013-07-08 ENCOUNTER — Emergency Department (HOSPITAL_COMMUNITY)
Admission: EM | Admit: 2013-07-08 | Discharge: 2013-07-08 | Disposition: A | Discharge status: Home / Self Care | Payer: 59 | Source: Home / Self Care | Attending: Family Medicine | Admitting: Family Medicine

## 2013-07-08 ENCOUNTER — Encounter (HOSPITAL_COMMUNITY): Payer: Self-pay | Admitting: *Deleted

## 2013-07-08 DIAGNOSIS — B084 Enteroviral vesicular stomatitis with exanthem: Secondary | ICD-10-CM

## 2013-07-08 MED ORDER — CHLORPHENIRAMINE-PSE-IBUPROFEN 2-30-200 MG PO TABS: ORAL_TABLET | ORAL | Status: DC

## 2013-07-08 MED ORDER — DPH-LIDO-ALHYDR-MGHYDR-SIMETH MT SUSP: 5.0000 mL | OROMUCOSAL | Status: DC

## 2013-07-08 NOTE — ED Notes (Signed)
C/o sore throat onset 2 days ago and rash on palms of hands and soles of feet this morning with itching.  Daughter has hand foot and mouth disease-diagnosed on Sunday.  Also has headache and pressure in her ears.  C/o chills but no fever noted.

## 2013-07-08 NOTE — ED Provider Notes (Signed)
Medical screening examination/treatment/procedure(s) were performed by resident physician or non-physician practitioner and as supervising physician I was immediately available for consultation/collaboration.   Mekesha Solomon DOUGLAS MD.   [place] Doig D Simon Llamas, MD 07/08/13 2056 

## 2013-07-08 NOTE — ED Provider Notes (Signed)
History    CSN: [id number] Arrival date & time 07/08/13  1555  First MD Initiated Contact with Patient 07/08/13 1715     Chief Complaint  Patient presents with  . Rash   (Consider location/radiation/quality/duration/timing/severity/associated sxs/prior Treatment) HPI Comments: Patient presents for evaluation of sore throat, subjective fever, headache, and itchy red rash on her palms of her hands and soles of her feet. The sore throat is made worse by swallowing and is somewhat relieved by taking Tylenol. Her daughter was recently diagnosed with hand foot mouth disease and she thinks she may have caught this from her daughter. She states she knows there is nothing really to do for this other than treat the symptoms but she wanted to check to see if she needs to stay out of work. She denies rash elsewhere, nausea, vomiting, fatigue, or any other symptoms.  Patient is a 22 y.o. female presenting with rash.  Rash Associated symptoms: fever and sore throat   Associated symptoms: no chest pain, no chills, no cough, no dysuria, no nausea, no shortness of breath and no vomiting    Past Medical History  Diagnosis Date  . Urinary tract infection   . Vertigo   . SVD (spontaneous vaginal delivery) 05/09/2012   Past Surgical History  Procedure Laterality Date  . Tonsillectomy      adenoidectomy  . Wisdom tooth extraction     Family History  Problem Relation Age of Onset  . Anesthesia problems Neg Hx   . Diabetes Maternal Grandmother   . Breast cancer Maternal Grandmother   . Thyroid disease Father    History  Substance Use Topics  . Smoking status: Never Smoker   . Smokeless tobacco: Never Used  . Alcohol Use: No   OB History   Grav Para Term Preterm Abortions TAB SAB Ect Mult Living   1 1 1  0 0 0 0 0 0 1     Review of Systems  Constitutional: Positive for fever. Negative for chills.  HENT: Positive for sore throat. Negative for mouth sores, trouble swallowing and voice change.    Eyes: Negative for visual disturbance.  Respiratory: Negative for cough and shortness of breath.   Cardiovascular: Negative for chest pain, palpitations and leg swelling.  Gastrointestinal: Negative for nausea, vomiting and abdominal pain.  Endocrine: Negative for polydipsia and polyuria.  Genitourinary: Negative for dysuria, urgency and frequency.  Musculoskeletal: Negative for myalgias and arthralgias.  Skin: Positive for rash.  Neurological: Positive for headaches. Negative for dizziness, weakness and light-headedness.    Allergies  Erythromycin and Penicillins  Home Medications   Current Outpatient Rx  Name  Route  Sig  Dispense  Refill  . acetaminophen (TYLENOL) 325 MG tablet   Oral   Take 650 mg by mouth every 6 (six) hours as needed. headache         . cephALEXin (KEFLEX) 500 MG capsule   Oral   Take 1 capsule (500 mg total) by mouth 2 (two) times daily.   20 capsule   0   . Chlorpheniramine-PSE-Ibuprofen 2-30-200 MG TABS      2 tabs every 6 hrs PRN   84 each   0   . DPH-Lido-AlHydr-MgHydr-Simeth SUSP   Mouth/Throat   Use as directed 5 mLs in the mouth or throat every 4 (four) hours.   200 mL   0   . ibuprofen (ADVIL,MOTRIN) 600 MG tablet   Oral   Take 1 tablet (600 mg total) by mouth every 8 (  eight) hours as needed for pain.   21 tablet   0   . ondansetron (ZOFRAN-ODT) 4 MG disintegrating tablet   Oral   Take 1 tablet (4 mg total) by mouth every 8 (eight) hours as needed for nausea.   10 tablet   0   . Prenatal Vit-Fe Fumarate-FA (PRENATAL MULTIVITAMIN) TABS   Oral   Take 1 tablet by mouth daily.   30 tablet   12    BP 135/86  Pulse 87  Temp(Src) 98.2 F (36.8 C) (Oral)  Resp 16  SpO2 100%  LMP 07/01/2013  Breastfeeding? No Physical Exam  Nursing note and vitals reviewed. Constitutional: She is oriented to person, place, and time. Vital signs are normal. She appears well-developed and well-nourished. No distress.  HENT:  Head:  Atraumatic.  Mouth/Throat: Posterior oropharyngeal erythema present. No oropharyngeal exudate, posterior oropharyngeal edema or tonsillar abscesses.  Eyes: EOM are normal. Pupils are equal, round, and reactive to light.  Cardiovascular: Normal rate, regular rhythm and normal heart sounds.  Exam reveals no gallop and no friction rub.   No murmur heard. Pulmonary/Chest: Effort normal and breath sounds normal. No respiratory distress. She has no wheezes. She has no rales.  Abdominal: Soft. There is no tenderness.  Neurological: She is alert and oriented to person, place, and time. She has normal strength.  Skin: Skin is warm and dry. Rash noted. Rash is papular (discrete erythematous papules on the palms of hands and soles of feet only). She is not diaphoretic.  Psychiatric: She has a normal mood and affect. Her behavior is normal. Judgment normal.    ED Course  Procedures (including critical care time) Labs Reviewed - No data to display No results found. 1. Hand, foot and mouth disease     MDM  Treat symptomatically. Will give Magic mouthwash for sore throat. Followup as needed   Meds ordered this encounter  Medications  . DPH-Lido-AlHydr-MgHydr-Simeth SUSP    Sig: Use as directed 5 mLs in the mouth or throat every 4 (four) hours.    Dispense:  200 mL    Refill:  0  . Chlorpheniramine-PSE-Ibuprofen 2-30-200 MG TABS    Sig: 2 tabs every 6 hrs PRN    Dispense:  84 each    Refill:  0     Graylon Good, PA-C 07/08/13 1743

## 2014-01-25 ENCOUNTER — Ambulatory Visit (INDEPENDENT_AMBULATORY_CARE_PROVIDER_SITE_OTHER): Payer: 59 | Admitting: Internal Medicine

## 2014-01-25 VITALS — BP 124/72 | HR 86 | Temp 98.1°F | Resp 18 | Ht 66.0 in | Wt 188.0 lb

## 2014-01-25 DIAGNOSIS — R102 Pelvic and perineal pain: Secondary | ICD-10-CM

## 2014-01-25 DIAGNOSIS — R82998 Other abnormal findings in urine: Secondary | ICD-10-CM

## 2014-01-25 DIAGNOSIS — R109 Unspecified abdominal pain: Secondary | ICD-10-CM

## 2014-01-25 DIAGNOSIS — R8281 Pyuria: Secondary | ICD-10-CM

## 2014-01-25 DIAGNOSIS — M549 Dorsalgia, unspecified: Secondary | ICD-10-CM

## 2014-01-25 LAB — POCT CBC
Granulocyte percent: 69 %G (ref 37–80)
HCT, POC: 42 % (ref 37.7–47.9)
HEMOGLOBIN: 13.1 g/dL (ref 12.2–16.2)
LYMPH, POC: 2.8 (ref 0.6–3.4)
MCH: 28.8 pg (ref 27–31.2)
MCHC: 31.2 g/dL — AB (ref 31.8–35.4)
MCV: 92.2 fL (ref 80–97)
MID (CBC): 0.5 (ref 0–0.9)
MPV: 7.6 fL (ref 0–99.8)
PLATELET COUNT, POC: 301 10*3/uL (ref 142–424)
POC Granulocyte: 7.5 — AB (ref 2–6.9)
POC LYMPH PERCENT: 26.3 %L (ref 10–50)
POC MID %: 4.7 % (ref 0–12)
RBC: 4.55 M/uL (ref 4.04–5.48)
RDW, POC: 12.8 %
WBC: 10.8 10*3/uL — AB (ref 4.6–10.2)

## 2014-01-25 LAB — POCT UA - MICROSCOPIC ONLY
Casts, Ur, LPF, POC: NEGATIVE
Crystals, Ur, HPF, POC: NEGATIVE
Mucus, UA: NEGATIVE
RBC, URINE, MICROSCOPIC: NEGATIVE
Yeast, UA: NEGATIVE

## 2014-01-25 LAB — POCT URINALYSIS DIPSTICK
BILIRUBIN UA: NEGATIVE
GLUCOSE UA: NEGATIVE
KETONES UA: NEGATIVE
Nitrite, UA: NEGATIVE
SPEC GRAV UA: 1.02
Urobilinogen, UA: 0.2
pH, UA: 5.5

## 2014-01-25 LAB — POCT URINE PREGNANCY: Preg Test, Ur: NEGATIVE

## 2014-01-25 MED ORDER — CIPROFLOXACIN HCL 500 MG PO TABS: 500.0000 mg | ORAL_TABLET | Freq: Two times a day (BID) | ORAL | Status: DC

## 2014-01-25 MED ORDER — MELOXICAM 15 MG PO TABS: 15.0000 mg | ORAL_TABLET | Freq: Every day | ORAL | Status: DC

## 2014-01-25 NOTE — Progress Notes (Signed)
Subjective:    Patient ID: [name], female    DOB: 1991/04/20, 23 y.o.   MRN: [id number] This chart was scribed for Ellamae Siaobert Doolittle, MD by Nicholos Johnsenise Iheanachor, Medical Scribe. This patient's care was started at 8:05 PM.  Back Pain Pertinent negatives include no chest pain, dysuria or fever.  Headache  Associated symptoms include back pain. Pertinent negatives include no fever.   HPI Comments: [name] is a 23 y.o. female who presents to the [facility] complaining of gradually worsening back pain radiating to front; lower abdomen. Notes there were some chills in the beginning that have since resolved. Describes frontal pain as a cramp. Pt states in the last week she would get HAs out the blue and pressure behind the eyes. LMNP was January 6th. Pt has taken 2 OTC pregnancy test; both negative. Pt has been using birth control but states never consistently. States last time she used for a full month, it broke out her face so she discontinued. Last day she took a pill was beginning of January. Pt admits to having mild pain with intercourse within the last week; says pain is no different from before she had her child. She reports pain when trying to have BM but is still able to. Pt states her bladder has dropped since having her daughter. Says sometimes she feels like she doesn't empty her bladder completely when using the bathroom. Pt has had vertigo since she was a child. Driving in a car regardless if she is the passenger or driver can cause a flare up. Denies fever, diarrhea, dysuria, chills, cough.  Married --25 yo bladdre dropped after delivery  Review of Systems  Constitutional: Negative for fever, fatigue and unexpected weight change.  Respiratory: Negative for shortness of breath.   Cardiovascular: Negative for chest pain, palpitations and leg swelling.  Genitourinary: Positive for urgency and frequency. Negative for dysuria.       No dysparunia at last IC    Musculoskeletal: Positive for back pain.   Objective:  Physical Exam  Nursing note and vitals reviewed. Constitutional: She is oriented to person, place, and time. She appears well-developed and well-nourished. No distress.  HENT:  Head: Normocephalic and atraumatic.  Eyes: EOM are normal.  Neck: Neck supple. No tracheal deviation present.  Cardiovascular: Normal rate.   Pulmonary/Chest: Effort normal. No respiratory distress.  Abdominal: Soft. Bowel sounds are normal. She exhibits no distension and no mass. There is tenderness. There is no rebound and no guarding.  Mildly tender w/out reb over lower quads Neg flank tend to perc but tend to deep palp over flanks  Musculoskeletal: Normal range of motion.  SLR neg to 90 bilat Hips fullrom Knee ex R wnl Can't reproduce arm or leg pain  Neurological: She is alert and oriented to person, place, and time.  Skin: Skin is warm and dry.  Psychiatric: She has a normal mood and affect. Her behavior is normal.   Results for orders placed in visit on 01/25/14  POCT UA - MICROSCOPIC ONLY      Result Value Range   WBC, Ur, HPF, POC 5-7     RBC, urine, microscopic neg     Bacteria, U Microscopic 1+     Mucus, UA neg     Epithelial cells, urine per micros 1-3     Crystals, Ur, HPF, POC neg     Casts, Ur, LPF, POC neg     Yeast, UA neg  POCT URINALYSIS DIPSTICK      Result Value Range   Color, UA yellow     Clarity, UA cloudy     Glucose, UA neg     Bilirubin, UA neg     Ketones, UA neg     Spec Grav, UA 1.020     Blood, UA mod     pH, UA 5.5     Protein, UA trace     Urobilinogen, UA 0.2     Nitrite, UA neg     Leukocytes, UA large (3+)    hcg neg Wbc 10000 Assessment & Plan:  I have completed the patient encounter in its entirety as documented by the scribe, with editing by me where necessary. Robert P. Merla Riches, M.D. Stomach pain - Plan: POCT UA - Microscopic Only, POCT urinalysis dipstick  Back pain - Plan: POCT UA -  Microscopic Only, POCT urinalysis dipstick  Flank pain - Plan: POCT CBC, POCT urine pregnancy, Urine culture  Suprapubic pain - Plan: POCT CBC, POCT urine pregnancy, Urine culture  Pyuria - Plan: POCT CBC, POCT urine pregnancy, Urine culture  Meds ordered this encounter  Medications  . meloxicam (MOBIC) 15 MG tablet    Sig: Take 1 tablet (15 mg total) by mouth daily.    Dispense:  30 tablet    Refill:  0  . ciprofloxacin (CIPRO) 500 MG tablet    Sig: Take 1 tablet (500 mg total) by mouth 2 (two) times daily.    Dispense:  20 tablet    Refill:  0   Call with UC/Plan

## 2014-01-26 ENCOUNTER — Telehealth: Payer: Self-pay | Admitting: *Deleted

## 2014-01-26 NOTE — Telephone Encounter (Signed)
Call her Wednesday morning and explain the problem If she is better she should complete her treatment with Cipro If she is not responding I can work her in to the clinic at 104 on Wednesday

## 2014-01-26 NOTE — Telephone Encounter (Signed)
We sent off for a urine cx but the lab tech did not label the speciman and their new protocol is that they cannot run it and we cannot send a name descrepency. Also the pt has been treated with cipro so we cannot recollect.  Would you like for us to do anything different?

## 2014-01-28 NOTE — Telephone Encounter (Signed)
Pt is feeling better as far as urinary sxs. Still having back and arm pain. Will RTC for recheck if not better in a few days

## 2014-01-29 ENCOUNTER — Telehealth: Payer: Self-pay

## 2014-01-29 ENCOUNTER — Ambulatory Visit (INDEPENDENT_AMBULATORY_CARE_PROVIDER_SITE_OTHER): Payer: 59 | Admitting: Family Medicine

## 2014-01-29 VITALS — BP 124/76 | HR 82 | Temp 98.7°F | Resp 17 | Ht 66.0 in | Wt 187.0 lb

## 2014-01-29 DIAGNOSIS — S39012A Strain of muscle, fascia and tendon of lower back, initial encounter: Secondary | ICD-10-CM

## 2014-01-29 DIAGNOSIS — R109 Unspecified abdominal pain: Secondary | ICD-10-CM

## 2014-01-29 DIAGNOSIS — N949 Unspecified condition associated with female genital organs and menstrual cycle: Secondary | ICD-10-CM

## 2014-01-29 DIAGNOSIS — R102 Pelvic and perineal pain: Secondary | ICD-10-CM

## 2014-01-29 LAB — POCT UA - MICROSCOPIC ONLY
Bacteria, U Microscopic: NEGATIVE
Casts, Ur, LPF, POC: NEGATIVE
Crystals, Ur, HPF, POC: NEGATIVE
Epithelial cells, urine per micros: NEGATIVE
Mucus, UA: NEGATIVE
RBC, urine, microscopic: NEGATIVE
WBC, Ur, HPF, POC: NEGATIVE
Yeast, UA: NEGATIVE

## 2014-01-29 LAB — POCT URINALYSIS DIPSTICK
BILIRUBIN UA: NEGATIVE
GLUCOSE UA: NEGATIVE
KETONES UA: NEGATIVE
Nitrite, UA: NEGATIVE
PH UA: 7
Protein, UA: NEGATIVE
Spec Grav, UA: 1.015
Urobilinogen, UA: 0.2

## 2014-01-29 LAB — POCT CBC
Granulocyte percent: 72.5 % (ref 37–80)
HCT, POC: 42.3 % (ref 37.7–47.9)
Hemoglobin: 13 g/dL (ref 12.2–16.2)
Lymph, poc: 2.2 (ref 0.6–3.4)
MCH, POC: 28.4 pg (ref 27–31.2)
MCHC: 30.7 g/dL — AB (ref 31.8–35.4)
MCV: 92.5 fL (ref 80–97)
MID (cbc): 0.5 (ref 0–0.9)
MPV: 8 fL (ref 0–99.8)
POC Granulocyte: 7 — AB (ref 2–6.9)
POC LYMPH PERCENT: 22.4 % (ref 10–50)
POC MID %: 5.2 % (ref 0–12)
Platelet Count, POC: 298 10*3/uL (ref 142–424)
RBC: 4.57 M/uL (ref 4.04–5.48)
RDW, POC: 12.7 %
WBC: 9.7 10*3/uL (ref 4.6–10.2)

## 2014-01-29 LAB — POCT WET PREP WITH KOH
Clue Cells Wet Prep HPF POC: NEGATIVE
KOH Prep POC: NEGATIVE
Trichomonas, UA: NEGATIVE
Yeast Wet Prep HPF POC: NEGATIVE

## 2014-01-29 MED ORDER — METHOCARBAMOL 500 MG PO TABS: ORAL_TABLET | ORAL | Status: DC

## 2014-01-29 NOTE — Patient Instructions (Signed)
Continue taking the meloxicam one daily for pain and inflammation  Stopped the Cipro after fifth day  Take Robaxin 1 in the morning, one in the afternoon, and 2 at bedtime for muscle relaxants  If pain gets intensely worse to the emergency room return here  If pain persists may need, ultrasound or gynecology recheck

## 2014-01-29 NOTE — Telephone Encounter (Signed)
Pt states that this morning after intercourse she experienced cramping in her lower abd that almost sent her to the hospital. Advised pt to RTC. She is coming in today.

## 2014-01-29 NOTE — Progress Notes (Signed)
Subjective: Patient was here less than a week ago with low abdominal and right flank pain. She was worked up by Dr. Merla Richesoolittle and had mild pyuria which was treated with Cipro. She was doing little bit better, then this morning after having intercourse she developed worse pelvic and right flank pain. She has a history of some chronic nausea from a vertigo, so she says the nausea that she had was not unusual. Her bowels acting normally this morning. She's not been having any major dysuria. She's not had any nocturia. She hurts as noted above.  Objective: Alert and oriented. Abdomen has normal bowel sounds. Soft without organomegaly or masses. S. mild tenderness across the suprapubic area, right lower quadrant, and right flank. The right flank pain is from the CVA area down to the pelvic crest. Pelvic normal external genitalia. Vaginal mucosa unremarkable. Has a little thick mucoid discharge, but as noted above  had intercourse this morning. wet prep was taken. Gen-Probe also collected. Patient wasn't sure when she had her last STD testing. Her husband comes in her in exam today.  Assessment: Low abdominal pain Flank pain  Plan: CBC UA  Results for orders placed in visit on 01/29/14  POCT CBC      Result Value Range   WBC 9.7  4.6 - 10.2 K/uL   Lymph, poc 2.2  0.6 - 3.4   POC LYMPH PERCENT 22.4  10 - 50 %L   MID (cbc) 0.5  0 - 0.9   POC MID % 5.2  0 - 12 %M   POC Granulocyte 7.0 (*) 2 - 6.9   Granulocyte percent 72.5  37 - 80 %G   RBC 4.57  4.04 - 5.48 M/uL   Hemoglobin 13.0  12.2 - 16.2 g/dL   HCT, POC 16.142.3  09.637.7 - 47.9 %   MCV 92.5  80 - 97 fL   MCH, POC 28.4  27 - 31.2 pg   MCHC 30.7 (*) 31.8 - 35.4 g/dL   RDW, POC 04.512.7     Platelet Count, POC 298  142 - 424 K/uL   MPV 8.0  0 - 99.8 fL  POCT UA - MICROSCOPIC ONLY      Result Value Range   WBC, Ur, HPF, POC neg     RBC, urine, microscopic neg     Bacteria, U Microscopic neg     Mucus, UA neg     Epithelial cells, urine per  micros neg     Crystals, Ur, HPF, POC neg     Casts, Ur, LPF, POC neg     Yeast, UA neg    POCT URINALYSIS DIPSTICK      Result Value Range   Color, UA yellow     Clarity, UA clear     Glucose, UA neg     Bilirubin, UA neg     Ketones, UA neg     Spec Grav, UA 1.015     Blood, UA trace-lysed     pH, UA 7.0     Protein, UA neg     Urobilinogen, UA 0.2     Nitrite, UA neg     Leukocytes, UA small (1+)    POCT WET PREP WITH KOH      Result Value Range   Trichomonas, UA Negative     Clue Cells Wet Prep HPF POC neg     Epithelial Wet Prep HPF POC 0-1     Yeast Wet Prep HPF POC neg  Bacteria Wet Prep HPF POC 1+     RBC Wet Prep HPF POC 0-1     WBC Wet Prep HPF POC 1-3     KOH Prep POC Negative    Impression: Nonspecific pelvic pain Muscular back pain

## 2014-01-29 NOTE — Telephone Encounter (Signed)
Patient is still experiencing lower abdominal pain.  [phone]

## 2014-01-30 LAB — GC/CHLAMYDIA PROBE AMP
CT Probe RNA: NEGATIVE
GC PROBE AMP APTIMA: NEGATIVE

## 2014-04-24 ENCOUNTER — Emergency Department: Payer: Self-pay | Admitting: Emergency Medicine

## 2014-04-24 LAB — COMPREHENSIVE METABOLIC PANEL
ALBUMIN: 3.9 g/dL (ref 3.4–5.0)
ALK PHOS: 71 U/L
ALT: 32 U/L (ref 12–78)
ANION GAP: 7 (ref 7–16)
AST: 33 U/L (ref 15–37)
BUN: 18 mg/dL (ref 7–18)
Bilirubin,Total: 0.4 mg/dL (ref 0.2–1.0)
Calcium, Total: 8.9 mg/dL (ref 8.5–10.1)
Chloride: 103 mmol/L (ref 98–107)
Co2: 26 mmol/L (ref 21–32)
Creatinine: 0.78 mg/dL (ref 0.60–1.30)
EGFR (African American): >60
Glucose: 88 mg/dL (ref 65–99)
OSMOLALITY: 273 (ref 275–301)
POTASSIUM: 3.9 mmol/L (ref 3.5–5.1)
SODIUM: 136 mmol/L (ref 136–145)
TOTAL PROTEIN: 8.1 g/dL (ref 6.4–8.2)

## 2014-04-24 LAB — TROPONIN I: Troponin-I: <0.02 ng/mL

## 2014-04-24 LAB — CBC WITH DIFFERENTIAL/PLATELET
Basophil #: 0 10*3/uL (ref 0.0–0.1)
Basophil %: 0.2 %
Eosinophil #: 0.2 10*3/uL (ref 0.0–0.7)
Eosinophil %: 2.2 %
HCT: 40.3 % (ref 35.0–47.0)
HGB: 13.6 g/dL (ref 12.0–16.0)
Lymphocyte #: 2 10*3/uL (ref 1.0–3.6)
Lymphocyte %: 17.7 %
MCH: 29.6 pg (ref 26.0–34.0)
MCHC: 33.8 g/dL (ref 32.0–36.0)
MCV: 88 fL (ref 80–100)
MONO ABS: 0.6 x10 3/mm (ref 0.2–0.9)
MONOS PCT: 5 %
NEUTROS ABS: 8.6 10*3/uL — AB (ref 1.4–6.5)
Neutrophil %: 74.9 %
Platelet: 295 10*3/uL (ref 150–440)
RBC: 4.6 10*6/uL (ref 3.80–5.20)
RDW: 13.1 % (ref 11.5–14.5)
WBC: 11.5 10*3/uL — AB (ref 3.6–11.0)

## 2014-04-24 LAB — URINALYSIS, COMPLETE
BACTERIA: NONE SEEN
Bilirubin,UR: NEGATIVE
GLUCOSE, UR: NEGATIVE mg/dL (ref 0–75)
KETONE: NEGATIVE
NITRITE: NEGATIVE
Ph: 6 (ref 4.5–8.0)
Protein: NEGATIVE
SPECIFIC GRAVITY: 1.018 (ref 1.003–1.030)
Squamous Epithelial: 4

## 2014-04-24 LAB — LIPASE, BLOOD: Lipase: 123 U/L (ref 73–393)

## 2014-05-26 ENCOUNTER — Ambulatory Visit: Payer: Self-pay | Admitting: Gastroenterology

## 2015-05-30 ENCOUNTER — Emergency Department
Admission: EM | Admit: 2015-05-30 | Discharge: 2015-05-30 | Disposition: A | Discharge status: Home / Self Care | Payer: 59 | Attending: Emergency Medicine | Admitting: Emergency Medicine

## 2015-05-30 ENCOUNTER — Encounter: Payer: Self-pay | Admitting: Emergency Medicine

## 2015-05-30 DIAGNOSIS — Z79899 Other long term (current) drug therapy: Secondary | ICD-10-CM | POA: Insufficient documentation

## 2015-05-30 DIAGNOSIS — Z880 Allergy status to penicillin: Secondary | ICD-10-CM | POA: Diagnosis not present

## 2015-05-30 DIAGNOSIS — M545 Low back pain: Secondary | ICD-10-CM | POA: Diagnosis present

## 2015-05-30 DIAGNOSIS — Z791 Long term (current) use of non-steroidal anti-inflammatories (NSAID): Secondary | ICD-10-CM | POA: Insufficient documentation

## 2015-05-30 LAB — URINALYSIS COMPLETE WITH MICROSCOPIC (ARMC ONLY)
BACTERIA UA: NONE SEEN
Bilirubin Urine: NEGATIVE
Glucose, UA: NEGATIVE mg/dL
KETONES UR: NEGATIVE mg/dL
LEUKOCYTES UA: NEGATIVE
NITRITE: NEGATIVE
PROTEIN: NEGATIVE mg/dL
RBC / HPF: NONE SEEN RBC/hpf (ref 0–5)
Specific Gravity, Urine: 1.01 (ref 1.005–1.030)
pH: 5 (ref 5.0–8.0)

## 2015-05-30 LAB — CBC WITH DIFFERENTIAL/PLATELET
BASOS PCT: 0 % (ref 0–1)
Band Neutrophils: 2 % (ref 0–10)
Basophils Absolute: 0 10*3/uL (ref 0.0–0.1)
Blasts: 0 %
EOS ABS: 0 10*3/uL (ref 0.0–0.7)
Eosinophils Relative: 0 % (ref 0–5)
HEMATOCRIT: 37.1 % (ref 35.0–47.0)
Hemoglobin: 12.9 g/dL (ref 12.0–16.0)
Lymphocytes Relative: 62 % — ABNORMAL HIGH (ref 12–46)
Lymphs Abs: 6.3 10*3/uL — ABNORMAL HIGH (ref 0.7–4.0)
MCH: 30.4 pg (ref 26.0–34.0)
MCHC: 34.7 g/dL (ref 32.0–36.0)
MCV: 87.7 fL (ref 80.0–100.0)
Metamyelocytes Relative: 0 %
Monocytes Absolute: 0.9 10*3/uL (ref 0.1–1.0)
Monocytes Relative: 9 % (ref 3–12)
Myelocytes: 0 %
NEUTROS PCT: 27 % — AB (ref 43–77)
Neutro Abs: 2.9 10*3/uL (ref 1.7–7.7)
OTHER: 0 %
PROMYELOCYTES ABS: 0 %
Platelets: 148 10*3/uL — ABNORMAL LOW (ref 150–440)
RBC: 4.23 MIL/uL (ref 3.80–5.20)
RDW: 12.6 % (ref 11.5–14.5)
WBC: 10.1 10*3/uL (ref 3.6–11.0)
nRBC: 0 /100 WBC

## 2015-05-30 LAB — BASIC METABOLIC PANEL
ANION GAP: 3 — AB (ref 5–15)
BUN: 11 mg/dL (ref 6–20)
CALCIUM: 8 mg/dL — AB (ref 8.9–10.3)
CHLORIDE: 104 mmol/L (ref 101–111)
CO2: 30 mmol/L (ref 22–32)
CREATININE: 0.75 mg/dL (ref 0.44–1.00)
Glucose, Bld: 78 mg/dL (ref 65–99)
POTASSIUM: 3.6 mmol/L (ref 3.5–5.1)
SODIUM: 137 mmol/L (ref 135–145)

## 2015-05-30 MED ORDER — KETOROLAC TROMETHAMINE 10 MG PO TABS: 10.0000 mg | ORAL_TABLET | Freq: Four times a day (QID) | ORAL | Status: DC | PRN

## 2015-05-30 MED ORDER — KETOROLAC TROMETHAMINE 30 MG/ML IJ SOLN: 60.0000 mg | Freq: Once | INTRAMUSCULAR | Status: AC

## 2015-05-30 MED ORDER — BACLOFEN 10 MG PO TABS: 10.0000 mg | ORAL_TABLET | Freq: Every day | ORAL | Status: DC

## 2015-05-30 MED ORDER — KETOROLAC TROMETHAMINE 60 MG/2ML IM SOLN
INTRAMUSCULAR | Status: AC
  Administered 2015-05-30: 60 mg via INTRAMUSCULAR
  Filled 2015-05-30: qty 2

## 2015-05-30 NOTE — ED Provider Notes (Signed)
[facility] Emergency Department Provider Note  ____________________________________________  Time seen: Approximately 2:07 PM  I have reviewed the triage vital signs and the nursing notes.   HISTORY  Chief Complaint Back Pain   HPI [name] is a 24 y.o. female resents for evaluation of continuous low back pain 5 weeks. States that she was seen in urgent care and-Perl diagnosed with scoliosis. Prescription for Robaxin and prednisone with no relief. Still complains of back pain. Addition she complains of fever and chills and breaking out in sweats once a day. However over the past 3 days she has had a fever and chills for 3 days.Pain is 6/10  Past Medical History  Diagnosis Date  . Urinary tract infection   . Vertigo   . SVD (spontaneous vaginal delivery) 05/09/2012    Patient Active Problem List   Diagnosis Date Noted  . SVD (spontaneous vaginal delivery) 05/09/2012    Past Surgical History  Procedure Laterality Date  . Tonsillectomy      adenoidectomy  . Wisdom tooth extraction      Current Outpatient Rx  Name  Route  Sig  Dispense  Refill  . fexofenadine-pseudoephedrine (ALLEGRA-D) 60-120 MG per tablet   Oral   Take 1 tablet by mouth 2 (two) times daily.         . methocarbamol (ROBAXIN) 500 MG tablet      Take one in the morning, one in the afternoon, and 2 at bedtime for muscle or   30 tablet   0   . baclofen (LIORESAL) 10 MG tablet   Oral   Take 1 tablet (10 mg total) by mouth daily.   30 tablet   0   . cephALEXin (KEFLEX) 500 MG capsule   Oral   Take 1 capsule (500 mg total) by mouth 2 (two) times daily.   20 capsule   0   . ketorolac (TORADOL) 10 MG tablet   Oral   Take 1 tablet (10 mg total) by mouth every 6 (six) hours as needed.   20 tablet   0   . meloxicam (MOBIC) 15 MG tablet   Oral   Take 1 tablet (15 mg total) by mouth daily.   30 tablet   0     Allergies Erythromycin and Penicillins  Family  History  Problem Relation Age of Onset  . Anesthesia problems Neg Hx   . Diabetes Maternal Grandmother   . Breast cancer Maternal Grandmother   . Thyroid disease Father   . Kidney disease Paternal Grandmother     Social History History  Substance Use Topics  . Smoking status: Never Smoker   . Smokeless tobacco: Never Used  . Alcohol Use: No    Review of Systems Constitutional: Positive fever/chills Eyes: No visual changes. ENT: No sore throat. Metallic taste probably secondary to Robaxin. Cardiovascular: Denies chest pain. Respiratory: Denies shortness of breath. Gastrointestinal: No abdominal pain.  No nausea, no vomiting.  No diarrhea.  No constipation. Genitourinary: Negative for dysuria. Musculoskeletal: Positive for back pain. Just tender around the lumbar area but does not travel into the buttocks Skin: Negative for rash. Neurological: Negative for headaches, focal weakness or numbness.  10-point ROS otherwise negative.  ____________________________________________   PHYSICAL EXAM:  VITAL SIGNS: ED Triage Vitals  Enc Vitals Group     BP 05/30/15 1322 119/73 mmHg     Pulse Rate 05/30/15 1322 106     Resp 05/30/15 1322 18     Temp  05/30/15 1322 98.1 F (36.7 C)     Temp Source 05/30/15 1322 Oral     SpO2 05/30/15 1322 96 %     Weight 05/30/15 1322 180 lb (81.647 kg)     Height 05/30/15 1322 5\' 9"  (1.753 m)     Head Cir --      Peak Flow --      Pain Score 05/30/15 1322 10     Pain Loc --      Pain Edu? --      Excl. in GC? --     Constitutional: Alert and oriented. Well appearing and in no acute distress. Eyes: Conjunctivae are normal. PERRL. EOMI. Head: Atraumatic. Nose: No congestion/rhinnorhea. Mouth/Throat: Mucous membranes are moist.  Oropharynx non-erythematous. Neck: No stridor.   Cardiovascular: Normal rate, regular rhythm. Grossly normal heart sounds.  Good peripheral circulation. Respiratory: Normal respiratory effort.  No retractions.  Lungs CTAB. Gastrointestinal: Soft and nontender. No distention. No abdominal bruits. No CVA tenderness. Musculoskeletal: No lower extremity tenderness nor edema.  No joint effusions. Neurologic:  Normal speech and language. No gross focal neurologic deficits are appreciated. Speech is normal. No gait instability. Skin:  Skin is warm, dry and intact. No rash noted. Psychiatric: Mood and affect are normal. Speech and behavior are normal.  ____________________________________________   LABS (all labs ordered are listed, but only abnormal results are displayed)  Labs Reviewed  URINALYSIS COMPLETEWITH MICROSCOPIC ([facility] ONLY) - Abnormal; Notable for the following:    Color, Urine YELLOW (*)    APPearance CLEAR (*)    Hgb urine dipstick 1+ (*)    Squamous Epithelial / LPF 0-5 (*)    All other components within normal limits  CBC WITH DIFFERENTIAL/PLATELET - Abnormal; Notable for the following:    Platelets 148 (*)    All other components within normal limits  BASIC METABOLIC PANEL - Abnormal; Notable for the following:    Calcium 8.0 (*)    Anion gap 3 (*)    All other components within normal limits   ____________________________________________  EKG  None ____________________________________________  RADIOLOGY  None ____________________________________________   PROCEDURES  Procedure(s) performed: None  Critical Care performed: No  ____________________________________________   INITIAL IMPRESSION / ASSESSMENT AND PLAN / ED COURSE  Pertinent labs & imaging results that were available during my care of the patient were reviewed by me and considered in my medical decision making (see chart for details). Patient reports her pain went from a 10 to a 0.5. We'll treat for low back pain more likely inflammatory muscular in nature. Prescription given for Toradol 10 mg #20 to use as needed for pain. In baclofen 10 mg as needed for muscle pain. Patient voices no other emergency  medical complaints and will follow-up with worsening symptomology. ____________________________________________   FINAL CLINICAL IMPRESSION(S) / ED DIAGNOSES  Final diagnoses:  Low back pain without sciatica, unspecified back pain laterality      Evangeline DakinCharles M Beers, PA-C 05/30/15 1528  Phineas SemenGraydon Goodman, MD 05/30/15 1530

## 2015-05-30 NOTE — ED Notes (Signed)
Patient medicated with toradol IM and blood drawn using butterfly. Tolerated well and bandage applied. Family at bedside.

## 2015-05-30 NOTE — Discharge Instructions (Signed)
Back Pain, Adult °Back pain is very common. The pain often gets better over time. The cause of back pain is usually not dangerous. Most people can learn to manage their back pain on their own.  °HOME CARE  °· Stay active. Start with short walks on flat ground if you can. Try to walk farther each day. °· Do not sit, drive, or stand in one place for more than 30 minutes. Do not stay in bed. °· Do not avoid exercise or work. Activity can help your back heal faster. °· Be careful when you bend or lift an object. Bend at your knees, keep the object close to you, and do not twist. °· Sleep on a firm mattress. Lie on your side, and bend your knees. If you lie on your back, put a pillow under your knees. °· Only take medicines as told by your doctor. °· Put ice on the injured area. °¨ Put ice in a plastic bag. °¨ Place a towel between your skin and the bag. °¨ Leave the ice on for 15-20 minutes, 03-04 times a day for the first 2 to 3 days. After that, you can switch between ice and heat packs. °· Ask your doctor about back exercises or massage. °· Avoid feeling anxious or stressed. Find good ways to deal with stress, such as exercise. °GET HELP RIGHT AWAY IF:  °· Your pain does not go away with rest or medicine. °· Your pain does not go away in 1 week. °· You have new problems. °· You do not feel well. °· The pain spreads into your legs. °· You cannot control when you poop (bowel movement) or pee (urinate). °· Your arms or legs feel weak or lose feeling (numbness). °· You feel sick to your stomach (nauseous) or throw up (vomit). °· You have belly (abdominal) pain. °· You feel like you may pass out (faint). °MAKE SURE YOU:  °· Understand these instructions. °· Will watch your condition. °· Will get help right away if you are not doing well or get worse. °Document Released: 06/04/2008 Document Revised: 03/10/2012 Document Reviewed: 04/20/2014 °[employer]® Patient Information ©2015 [employer]. This information is not intended  to replace advice given to you by your health care provider. Make sure you discuss any questions you have with your health care provider. ° °

## 2015-05-30 NOTE — ED Notes (Signed)
Pt states she has had lower back pain x 5 weeks, seen at urgent care and given flexeril for pain but states she ran out of flexeril yesterday, denies any urinary symptoms

## 2016-03-23 ENCOUNTER — Ambulatory Visit (INDEPENDENT_AMBULATORY_CARE_PROVIDER_SITE_OTHER): Payer: 59 | Admitting: Family Medicine

## 2016-03-23 ENCOUNTER — Encounter: Payer: Self-pay | Admitting: Family Medicine

## 2016-03-23 VITALS — BP 120/80 | HR 79 | Temp 98.2°F | Resp 18 | Ht 65.0 in | Wt 184.0 lb

## 2016-03-23 DIAGNOSIS — R200 Anesthesia of skin: Secondary | ICD-10-CM | POA: Diagnosis not present

## 2016-03-23 NOTE — Patient Instructions (Signed)
Nice to meet you. We will refer you to neurology for further evaluation of your intermittent numbness and vertigo. If you develop persistent numbness, weakness, vertigo, vision changes, or any new or changing symptoms please seek medical attention.

## 2016-03-23 NOTE — Progress Notes (Signed)
Patient ID: [name], female   DOB: 24-Sep-1991, 25 y.o.   MRN: [id number]  Marikay Alar, MD Phone: [phone]  [name] is a 25 y.o. female who presents today for new patient visit.  Patient notes for many years she has had issues with the left side of her body. She notes she's been advised that it is a pinched nerve. Notes she gets a shooting pain from her left shoulder down her left arm and down her left side of her body and into her left leg and foot. It is becoming more consistent. She has a history of losing feeling in the left side of her body when she has her body in certain positions. Last time this happened was yesterday and it resolved with change in position. She additionally notes some room spinning on standing up with spotty vision when this occurs. Goes away after sitting down for 5 minutes. She does have a history of frontal headaches that occur 4 days a week that have been chronic. Does not go away with medications. They go away with relaxation. She has been evaluated by the eye doctor recently. She has no chest pain, shortness breath, or palpitations with any of this. No weakness with this. She notes these symptoms a been occurring since she was 89 or 25 years old, though have recently become more frequent.  Active Ambulatory Problems    Diagnosis Date Noted  . SVD (spontaneous vaginal delivery) 05/09/2012  . Numbness 03/25/2016   Resolved Ambulatory Problems    Diagnosis Date Noted  . No Resolved Ambulatory Problems   Past Medical History  Diagnosis Date  . Urinary tract infection   . Vertigo   . Hx of migraine headaches   . History of chicken pox     Family History  Problem Relation Age of Onset  . Anesthesia problems Neg Hx   . Diabetes Maternal Grandmother   . Breast cancer Maternal Grandmother   . Thyroid disease Father   . Kidney disease Paternal Grandmother     Social History   Social History  . Marital Status: Single    Spouse Name: N/A   . Number of Children: N/A  . Years of Education: N/A   Occupational History  . Not on file.   Social History Main Topics  . Smoking status: Never Smoker   . Smokeless tobacco: Never Used  . Alcohol Use: No  . Drug Use: No  . Sexual Activity: Yes    Birth Control/ Protection: None   Other Topics Concern  . Not on file   Social History Narrative    ROS see history of present illness  Objective  Physical Exam Filed Vitals:   03/23/16 1037  BP: 120/80  Pulse: 79  Temp: 98.2 F (36.8 C)  Resp: 18   Laying blood pressure 112/70 pulse 75 Sitting blood pressure 124/82 pulse 68 Standing blood pressure 126/72 pulse 67  BP Readings from Last 3 Encounters:  03/23/16 120/80  05/30/15 140/72  01/29/14 124/76   Wt Readings from Last 3 Encounters:  03/23/16 184 lb (83.462 kg)  05/30/15 173 lb (78.472 kg)  01/29/14 187 lb (84.823 kg)    Physical Exam  Constitutional: She is well-developed, well-nourished, and in no distress.  HENT:  Head: Normocephalic and atraumatic.  Right Ear: External ear normal.  Left Ear: External ear normal.  Mouth/Throat: Oropharynx is clear and moist. No oropharyngeal exudate.  Eyes: Conjunctivae are normal. Pupils are equal, round, and reactive to light.  Neck: Neck supple.  Cardiovascular: Normal rate, regular rhythm and normal heart sounds.  Exam reveals no gallop and no friction rub.   No murmur heard. Pulmonary/Chest: Effort normal and breath sounds normal. No respiratory distress. She has no wheezes. She has no rales.  Abdominal: Soft. Bowel sounds are normal. She exhibits no distension. There is no tenderness. There is no rebound and no guarding.  Musculoskeletal: She exhibits no edema.  Lymphadenopathy:    She has no cervical adenopathy.  Neurological: She is alert.  CN 2-12 intact, 5/5 strength in bilateral biceps, triceps, grip, quads, hamstrings, plantar and dorsiflexion, sensation to light touch intact in bilateral UE and LE,  normal gait, 2+ patellar reflexes  Skin: Skin is warm and dry. She is not diaphoretic.  Psychiatric: Mood and affect normal.     Assessment/Plan:   Numbness Patient with a long chronic history of left sided body issues including sharp shooting pains and numbness. She is neurologically intact today. I discussed possible causes including peripheral nerve impingement and central processes. It seems odd that this would be a peripheral issue given that it occurs in the entire left part of her body. Given chronicity doubt CVA. Could be MS given chronicity and relapsing nature. Given chronicity and that she is neurologically intact today we will refer her to neurology to determine further workup. She will continue to monitor. She's given return precautions.    Orders Placed This Encounter  Procedures  . Ambulatory referral to Neurology    Referral Priority:  Routine    Referral Type:  Consultation    Referral Reason:  Specialty Services Required    Requested Specialty:  Neurology    Number of Visits Requested:  1    Marikay AlarEric Anahita Cua, MD [facility] [place]

## 2016-03-23 NOTE — Progress Notes (Signed)
Pre-visit discussion using our clinic review tool. No additional management support is needed unless otherwise documented below in the visit note.  

## 2016-03-25 DIAGNOSIS — R200 Anesthesia of skin: Secondary | ICD-10-CM | POA: Insufficient documentation

## 2016-03-25 NOTE — Assessment & Plan Note (Signed)
Patient with a long chronic history of left sided body issues including sharp shooting pains and numbness. She is neurologically intact today. I discussed possible causes including peripheral nerve impingement and central processes. It seems odd that this would be a peripheral issue given that it occurs in the entire left part of her body. Given chronicity doubt CVA. Could be MS given chronicity and relapsing nature. Given chronicity and that she is neurologically intact today we will refer her to neurology to determine further workup. She will continue to monitor. She's given return precautions.

## 2016-03-26 ENCOUNTER — Encounter: Payer: Self-pay | Admitting: Family Medicine

## 2016-04-02 ENCOUNTER — Encounter: Payer: Self-pay | Admitting: Family Medicine

## 2016-04-02 ENCOUNTER — Ambulatory Visit (INDEPENDENT_AMBULATORY_CARE_PROVIDER_SITE_OTHER): Payer: BLUE CROSS/BLUE SHIELD | Admitting: Family Medicine

## 2016-04-02 VITALS — BP 114/76 | HR 76 | Temp 98.1°F | Ht 65.0 in | Wt 188.6 lb

## 2016-04-02 DIAGNOSIS — R109 Unspecified abdominal pain: Secondary | ICD-10-CM | POA: Diagnosis not present

## 2016-04-02 DIAGNOSIS — J029 Acute pharyngitis, unspecified: Secondary | ICD-10-CM

## 2016-04-02 DIAGNOSIS — B349 Viral infection, unspecified: Secondary | ICD-10-CM | POA: Diagnosis not present

## 2016-04-02 LAB — POCT URINE PREGNANCY: Preg Test, Ur: NEGATIVE

## 2016-04-02 LAB — POCT RAPID STREP A (OFFICE): Rapid Strep A Screen: NEGATIVE

## 2016-04-02 LAB — POCT INFLUENZA A/B
Influenza A, POC: NEGATIVE
Influenza B, POC: NEGATIVE

## 2016-04-02 NOTE — Progress Notes (Signed)
Patient ID: [name], female   DOB: 1991/03/16, 25 y.o.   MRN: [id number]  Marikay AlarEric Korey Arroyo, MD Phone: [phone]  [name] is a 25 y.o. female who presents today for same-day visit.  Patient reports starting on Thursday she developed nausea and diarrhea. Vomited 1-2 times. Notes the symptoms stopped on Friday. Still some queasiness. Has actually been constipated since then. Had some stomach cramps yesterday though these have not recurred. No vaginal bleeding. No vaginal discharge. No urinary symptoms. She also notes sore throat and hoarseness starting Friday. No fevers. Some body aches. No postnasal drip. Some chest soreness though no chest pressure or pain. No shortness of breath. No wheezing. Some mild cough.  PMH: nonsmoker.   ROS see history of present illness  Objective  Physical Exam Filed Vitals:   04/02/16 1621  BP: 114/76  Pulse: 76  Temp: 98.1 F (36.7 C)    BP Readings from Last 3 Encounters:  04/02/16 114/76  03/23/16 120/80  05/30/15 140/72   Wt Readings from Last 3 Encounters:  04/02/16 188 lb 9.6 oz (85.548 kg)  03/23/16 184 lb (83.462 kg)  05/30/15 173 lb (78.472 kg)    Physical Exam  Constitutional: She is well-developed, well-nourished, and in no distress.  HENT:  Head: Normocephalic and atraumatic.  Right Ear: External ear normal.  Left Ear: External ear normal.  Mouth/Throat: Oropharynx is clear and moist. No oropharyngeal exudate.  Eyes: Conjunctivae are normal. Pupils are equal, round, and reactive to light.  Neck: Neck supple.  Cardiovascular: Normal rate, regular rhythm and normal heart sounds.   Pulmonary/Chest: Effort normal and breath sounds normal. She exhibits tenderness (left costochondral joints mildly tender).  Abdominal: Soft. Bowel sounds are normal. She exhibits no distension. There is no tenderness. There is no rebound and no guarding.  Lymphadenopathy:    She has no cervical adenopathy.  Neurological: She is alert.  Gait normal.  Skin: Skin is warm and dry. She is not diaphoretic.     Assessment/Plan: Please see individual problem list.  Viral illness Symptoms most consistent with viral illness. Likely viral gastroenteritis given vomiting and diarrhea. Benign abdominal exam. Suspect sore throat and mild cough related to viral illness. Strep throat, flu test, and pregnancy test all negative. Discussed supportive care. Saltwater gargles and honey in warm tea for sore throat. She'll continue to monitor. Given return precautions.    Orders Placed This Encounter  Procedures  . POCT Influenza A/B  . POCT rapid strep A  . POCT urine pregnancy    Marikay AlarEric [place] Perra, MD [facility] [place]

## 2016-04-02 NOTE — Progress Notes (Signed)
Pre visit review using our clinic review tool, if applicable. No additional management support is needed unless otherwise documented below in the visit note. 

## 2016-04-02 NOTE — Assessment & Plan Note (Addendum)
Symptoms most consistent with viral illness. Likely viral gastroenteritis given vomiting and diarrhea. Benign abdominal exam. Suspect sore throat and mild cough related to viral illness. Strep throat, flu test, and pregnancy test all negative. Discussed supportive care. Saltwater gargles and honey in warm tea for sore throat. She'll continue to monitor. Given return precautions.

## 2016-04-02 NOTE — Patient Instructions (Signed)
Nice to see you. Your symptoms are likely related to a viral illness. You should continue to stay well hydrated. You can use saltwater gargles and honey in warm tea for your sore throat. If you develop fevers, shortness of breath, chest pain, abdominal pain, blood in your stool, or any new or changing symptoms please seek medical attention.

## 2016-04-18 ENCOUNTER — Other Ambulatory Visit (INDEPENDENT_AMBULATORY_CARE_PROVIDER_SITE_OTHER): Payer: BLUE CROSS/BLUE SHIELD

## 2016-04-18 ENCOUNTER — Encounter: Payer: Self-pay | Admitting: Neurology

## 2016-04-18 ENCOUNTER — Ambulatory Visit (INDEPENDENT_AMBULATORY_CARE_PROVIDER_SITE_OTHER): Payer: BLUE CROSS/BLUE SHIELD | Admitting: Neurology

## 2016-04-18 VITALS — BP 118/80 | HR 100 | Ht 65.0 in | Wt 184.2 lb

## 2016-04-18 DIAGNOSIS — R292 Abnormal reflex: Secondary | ICD-10-CM

## 2016-04-18 DIAGNOSIS — R252 Cramp and spasm: Secondary | ICD-10-CM

## 2016-04-18 DIAGNOSIS — R29818 Other symptoms and signs involving the nervous system: Secondary | ICD-10-CM

## 2016-04-18 DIAGNOSIS — M792 Neuralgia and neuritis, unspecified: Secondary | ICD-10-CM | POA: Diagnosis not present

## 2016-04-18 LAB — VITAMIN D 25 HYDROXY (VIT D DEFICIENCY, FRACTURES): VITD: 21.23 ng/mL — AB (ref 30.00–100.00)

## 2016-04-18 LAB — VITAMIN B12: Vitamin B-12: 669 pg/mL (ref 211–911)

## 2016-04-18 LAB — SEDIMENTATION RATE: SED RATE: 26 mm/h — AB (ref 0–22)

## 2016-04-18 LAB — TSH: TSH: 1.05 u[IU]/mL (ref 0.35–4.50)

## 2016-04-18 NOTE — Patient Instructions (Addendum)
1.  MRI cervical spine wwo contrast 2.  Check blood work 3.  We will call you with the results of the testing and decide whether we need to get imaging of the brain next  Return to clinic in 2-3 months

## 2016-04-18 NOTE — Progress Notes (Signed)
[facility] Clinic Note - Initial Visit   Date: 04/18/2016  [name] MRN: [id number] DOB: 05-08-1991   Dear Dr. Caryl Bis:  Thank you for your kind referral of [name] for consultation of left sided paresthesias. Although her history is well known to you, please allow [name] to reiterate it for the purpose of our medical record. The patient was accompanied to the clinic by self.    History of Present Illness: [name] is a 25 y.o. right-handed Caucasian female with no prior medical history presenting for evaluation of left sided paresthesias.    Since her teen years, she began noticing sensation of her body falling asleep, worse on the left side and always when reaching up to reach something from a cabinet.  She recalls dropping a glass on two occasions when trying to reach for it several years ago.  In 2012, she started working as a Estate agent at [employer] and began having sharp pain down her left leg and steadily progressed to involve her left arm.  She feels as if the left side is stiff and always wants to stretch it.   Symptoms can occur at rest, with activity, or sitting and last a few minutes until she is able to repositions herself, afterwhich symptoms quickly resolve.  If she stays in the same position, the paresthesias remain constant.  She also complains of frequent left foot cramps.  Since the birth of her daughter in 2013, the sensation of her legs falling asleep has become worse because she is always staying active with her.  The only time that her right side is involved is if she reaches for something above her head and this can trigger whole body numbness.    She has has history of vertigo since childhood.  She denies any vision changes, limb weakness, facial paresthesia, or bowel/bladder incontinence.   Temperature does not affect her symptoms.   Out-side paper records, electronic medical record, and images have been reviewed where  available and summarized as:  Lab Results  Component Value Date   NA 137 05/30/2015   K 3.6 05/30/2015   CL 104 05/30/2015   CO2 30 05/30/2015   Lab Results  Component Value Date   CREATININE 0.75 05/30/2015     Past Medical History  Diagnosis Date  . Urinary tract infection   . Vertigo   . SVD (spontaneous vaginal delivery) 05/09/2012  . Hx of migraine headaches   . History of chicken pox     Past Surgical History  Procedure Laterality Date  . Tonsillectomy      adenoidectomy  . Wisdom tooth extraction       Medications:  No outpatient encounter prescriptions on file as of 04/18/2016.   No facility-administered encounter medications on file as of 04/18/2016.     Allergies:  Allergies  Allergen Reactions  . Erythromycin   . Penicillins Hives    Family History: Family History  Problem Relation Age of Onset  . Anesthesia problems Neg Hx   . Diabetes Maternal Grandmother   . Breast cancer Maternal Grandmother   . Thyroid disease Father   . Kidney disease Paternal Grandmother   . Parkinson's disease Maternal Grandmother     Social History: Social History  Substance Use Topics  . Smoking status: Never Smoker   . Smokeless tobacco: Never Used  . Alcohol Use: No   Social History   Social History Narrative   Lives with husband and daughter in a  one story home.  Works for [employer].  Education: some college.    Review of Systems:  CONSTITUTIONAL: No fevers, chills, night sweats, or weight loss.   EYES: No visual changes or eye pain ENT: No hearing changes.  No history of nose bleeds.   RESPIRATORY: No cough, wheezing and shortness of breath.   CARDIOVASCULAR: Negative for chest pain, and palpitations.   GI: Negative for abdominal discomfort, blood in stools or black stools.  No recent change in bowel habits.   GU:  No history of incontinence.   MUSCLOSKELETAL: No history of joint pain or swelling.  No myalgias.   SKIN: Negative for  lesions, rash, and itching.   HEMATOLOGY/ONCOLOGY: Negative for prolonged bleeding, bruising easily, and swollen nodes.  No history of cancer.   ENDOCRINE: Negative for cold or heat intolerance, polydipsia or goiter.   PSYCH:  No depression or anxiety symptoms.   NEURO: As Above.   Vital Signs:  BP 118/80 mmHg  Pulse 100  Ht 5' 5"  (1.651 m)  Wt 184 lb 4 oz (83.575 kg)  BMI 30.66 kg/m2  SpO2 98%  LMP 03/09/2016 (Exact Date) Pain Scale: 0 on a scale of 0-10   General Medical Exam:   General:  Well appearing, comfortable.   Eyes/ENT: see cranial nerve examination.   Neck: No masses appreciated.  Full range of motion without tenderness.  No carotid bruits. Respiratory:  Clear to auscultation, good air entry bilaterally.   Cardiac:  Regular rate and rhythm, no murmur.   Extremities:  No deformities, edema, or skin discoloration.  Skin:  No rashes or lesions.  Neurological Exam: MENTAL STATUS including orientation to time, place, person, recent and remote memory, attention span and concentration, language, and fund of knowledge is normal.  Speech is not dysarthric.  CRANIAL NERVES: II:  No visual field defects.  Unremarkable fundi.   III-IV-VI: Pupils equal round and reactive to light.  Normal conjugate, extra-ocular eye movements in all directions of gaze.  No nystagmus.  No ptosis.   V:  Normal facial sensation.  Jaw jerk is absent.   VII:  Normal facial symmetry and movements.  No pathologic facial reflexes.  VIII:  Normal hearing and vestibular function.   IX-X:  Normal palatal movement.   XI:  Normal shoulder shrug and head rotation.   XII:  Normal tongue strength and range of motion, no deviation or fasciculation.  MOTOR:  No atrophy, fasciculations or abnormal movements.  No pronator drift.  Tone is normal.    Right Upper Extremity:    Left Upper Extremity:    Deltoid  5/5   Deltoid  5/5   Biceps  5/5   Biceps  5/5   Triceps  5/5   Triceps  5/5   Wrist extensors  5/5    Wrist extensors  5/5   Wrist flexors  5/5   Wrist flexors  5/5   Finger extensors  5/5   Finger extensors  5/5   Finger flexors  5/5   Finger flexors  5/5   Dorsal interossei  5/5   Dorsal interossei  5/5   Abductor pollicis  5/5   Abductor pollicis  5/5   Tone (Ashworth scale)  0  Tone (Ashworth scale)  0   Right Lower Extremity:    Left Lower Extremity:    Hip flexors  5/5   Hip flexors  5/5   Hip extensors  5/5   Hip extensors  5/5   Knee flexors  5/5   Knee flexors  5/5   Knee extensors  5/5   Knee extensors  5/5   Dorsiflexors  5/5   Dorsiflexors  5/5   Plantarflexors  5/5   Plantarflexors  5/5   Toe extensors  5/5   Toe extensors  5/5   Toe flexors  5/5   Toe flexors  5/5   Tone (Ashworth scale)  0  Tone (Ashworth scale)  0   MSRs:  Right                                                                 Left brachioradialis 2+  brachioradialis 2+  biceps 2+  biceps 1+  triceps 2+  triceps 2+  patellar 2+  patellar 2+  ankle jerk 2+  ankle jerk 2+  Hoffman no  Hoffman no  plantar response down  plantar response down   SENSORY:  Normal and symmetric perception of light touch, pinprick, vibration, and proprioception.  Romberg's sign absent.   COORDINATION/GAIT: Normal finger-to- nose-finger and heel-to-shin.  Intact rapid alternating movements bilaterally.  Able to rise from a chair without using arms.  Gait narrow based and stable. Tandem and stressed gait intact.    IMPRESSION: [name] is a delightful 25 year-old female referred for evaluation of transient left hemisensory paresthesias.  Her exam shows depressed left biceps reflex with normal reflexes elsewhere.  There is no weakness involving C5-6 myotomes and sensation is preserved throughout.  I was unable to appreciate any upper motor neuron signs, but with her left hemisensory symptoms, structural lesion of the spinal cord needs to be evaluated for.   Demyelinating disease is always in the differential for a patient  presenting with neurological symptoms in this age range, therefore, if MRI cervical spine is normal, the next step will be imaging of the brian.    PLAN/RECOMMENDATIONS:  1.  MRI cervical spine wwo contrast 2.  Check ESR, vitamin B12, copper, vitamin D, TSH  Return to clinic in 2-3 months.   The duration of this appointment visit was 40 minutes of face-to-face time with the patient.  Greater than 50% of this time was spent in counseling, explanation of diagnosis, planning of further management, and coordination of care.   Thank you for allowing me to participate in patient's care.  If I can answer any additional questions, I would be pleased to do so.    Sincerely,    Donika K. Posey Pronto, DO

## 2016-04-21 LAB — COPPER, SERUM: Copper: 195 ug/dL — ABNORMAL HIGH (ref 72–166)

## 2016-04-25 ENCOUNTER — Ambulatory Visit
Admission: RE | Admit: 2016-04-25 | Discharge: 2016-04-25 | Disposition: A | Discharge status: Home / Self Care | Payer: BLUE CROSS/BLUE SHIELD | Source: Ambulatory Visit | Attending: Neurology | Admitting: Neurology

## 2016-04-25 DIAGNOSIS — R200 Anesthesia of skin: Secondary | ICD-10-CM | POA: Diagnosis not present

## 2016-04-25 DIAGNOSIS — M792 Neuralgia and neuritis, unspecified: Secondary | ICD-10-CM

## 2016-04-25 DIAGNOSIS — R29818 Other symptoms and signs involving the nervous system: Secondary | ICD-10-CM

## 2016-04-25 DIAGNOSIS — R252 Cramp and spasm: Secondary | ICD-10-CM

## 2016-04-25 DIAGNOSIS — R292 Abnormal reflex: Secondary | ICD-10-CM

## 2016-04-25 MED ORDER — GADOBENATE DIMEGLUMINE 529 MG/ML IV SOLN
17.0000 mL | Freq: Once | INTRAVENOUS | Status: AC | PRN
  Administered 2016-04-25: 17 mL via INTRAVENOUS

## 2016-04-26 ENCOUNTER — Other Ambulatory Visit: Payer: Self-pay | Admitting: *Deleted

## 2016-04-26 DIAGNOSIS — M792 Neuralgia and neuritis, unspecified: Secondary | ICD-10-CM

## 2016-04-26 DIAGNOSIS — R29818 Other symptoms and signs involving the nervous system: Secondary | ICD-10-CM

## 2016-04-26 DIAGNOSIS — R252 Cramp and spasm: Secondary | ICD-10-CM

## 2016-04-27 ENCOUNTER — Other Ambulatory Visit: Payer: Self-pay | Admitting: *Deleted

## 2016-04-27 DIAGNOSIS — R252 Cramp and spasm: Secondary | ICD-10-CM

## 2016-04-27 DIAGNOSIS — R29818 Other symptoms and signs involving the nervous system: Secondary | ICD-10-CM

## 2016-04-27 DIAGNOSIS — M792 Neuralgia and neuritis, unspecified: Secondary | ICD-10-CM

## 2016-05-02 ENCOUNTER — Other Ambulatory Visit (INDEPENDENT_AMBULATORY_CARE_PROVIDER_SITE_OTHER): Payer: BLUE CROSS/BLUE SHIELD

## 2016-05-02 DIAGNOSIS — R29818 Other symptoms and signs involving the nervous system: Secondary | ICD-10-CM | POA: Diagnosis not present

## 2016-05-02 DIAGNOSIS — M792 Neuralgia and neuritis, unspecified: Secondary | ICD-10-CM | POA: Diagnosis not present

## 2016-05-02 DIAGNOSIS — R252 Cramp and spasm: Secondary | ICD-10-CM

## 2016-05-02 LAB — TSH: TSH: 1.63 u[IU]/mL (ref 0.35–4.50)

## 2016-05-02 LAB — VITAMIN B12: VITAMIN B 12: 670 pg/mL (ref 211–911)

## 2016-05-04 LAB — CERULOPLASMIN: CERULOPLASMIN: 32 mg/dL (ref 18–53)

## 2016-05-08 ENCOUNTER — Encounter: Payer: Self-pay | Admitting: *Deleted

## 2016-06-27 ENCOUNTER — Encounter: Payer: Self-pay | Admitting: Family Medicine

## 2016-06-27 ENCOUNTER — Ambulatory Visit (INDEPENDENT_AMBULATORY_CARE_PROVIDER_SITE_OTHER): Payer: BLUE CROSS/BLUE SHIELD | Admitting: Family Medicine

## 2016-06-27 VITALS — BP 108/66 | HR 77 | Temp 98.3°F | Wt 184.4 lb

## 2016-06-27 DIAGNOSIS — J309 Allergic rhinitis, unspecified: Secondary | ICD-10-CM

## 2016-06-27 DIAGNOSIS — R200 Anesthesia of skin: Secondary | ICD-10-CM | POA: Diagnosis not present

## 2016-06-27 NOTE — Progress Notes (Signed)
Patient ID: [name], female   DOB: May 29, 1991, 25 y.o.   MRN: [id number]  Marikay AlarEric Jadin Creque, MD Phone: [phone]  [name] is a 25 y.o. female who presents today for follow-up.  Numbness: Patient notes she has seen neurology and had an MRI of her cervical spine that was unremarkable. She had some lab work as well that was relatively unremarkable per her report. The next step was obtaining an MRI of her brain which she is unsure of. She notes the numbness has become much less frequent. Only occurs if she lays on her stomach at times. Is in her left arm and left leg. No other numbness at other times. No weakness. Headaches are gone. She does note they moved about a month ago and she found some mold in the place that they were living and since the move the headaches have gone away. She did see an alternative healthcare provider that her mom made her go see who supposedly used touch and water frequency to determine what kind of issues she had. She reports she was advised she had a bacteria in her brain from reusing water bottles. She was advised to take specific frequency Waters to help with this. Did not go back for follow-up.  Allergic rhinitis: Patient notes over the last 3 weeks she's had a tickle in her throat with some postnasal drip and mild congested nose. No fevers. No sinus congestion. No ear discomfort. She's been taking Benadryl for this and cough drops.  PMH: nonsmoker.   ROS see history of present illness  Objective  Physical Exam Filed Vitals:   06/27/16 0758  BP: 108/66  Pulse: 77  Temp: 98.3 F (36.8 C)    BP Readings from Last 3 Encounters:  06/27/16 108/66  04/18/16 118/80  04/02/16 114/76   Wt Readings from Last 3 Encounters:  06/27/16 184 lb 6.4 oz (83.643 kg)  04/18/16 184 lb 4 oz (83.575 kg)  04/02/16 188 lb 9.6 oz (85.548 kg)    Physical Exam  Constitutional: She is well-developed, well-nourished, and in no distress.  HENT:  Head:  Normocephalic and atraumatic.  Right Ear: External ear normal.  Left Ear: External ear normal.  Mouth/Throat: Oropharynx is clear and moist. No oropharyngeal exudate.  Normal TMs bilaterally  Eyes: Conjunctivae are normal. Pupils are equal, round, and reactive to light.  Cardiovascular: Normal rate, regular rhythm and normal heart sounds.   Pulmonary/Chest: Effort normal and breath sounds normal.  Musculoskeletal: She exhibits no edema.  Neurological: She is alert.  CN 2-12 intact, 5/5 strength in bilateral biceps, triceps, grip, quads, hamstrings, plantar and dorsiflexion, sensation to light touch intact in bilateral UE and LE, normal gait, absent patellar reflexes  Skin: Skin is warm and dry. She is not diaphoretic.     Assessment/Plan: Please see individual problem list.  Numbness Quite a bit improved. Negative workup thus far. Agree that MRI brain would be the next step in workup though patient is unsure if she wants to do this. She has follow-up next month with neurology and she will keep this. Neurologically intact today. Given return precautions.  Allergic rhinitis Symptoms most consistent with allergic rhinitis. Advised on over-the-counter Flonase and Claritin or Zyrtec. She'll continue to monitor.    Marikay AlarEric Kameko Hukill, MD [facility]

## 2016-06-27 NOTE — Progress Notes (Signed)
Pre visit review using our clinic review tool, if applicable. No additional management support is needed unless otherwise documented below in the visit note. 

## 2016-06-27 NOTE — Patient Instructions (Signed)
Nice to see you. You can try Flonase over-the-counter one spray each nostril daily and Claritin or Zyrtec for your allergies. If this does not prove of benefit you can try 2 sprays each nostril daily of Flonase. Please monitor the numbness. If this recurs or becomes more persistent please let us know. If you develop persistent numbness or any weakness, headaches, or any new or changing symptoms please seek medical attention.

## 2016-06-27 NOTE — Assessment & Plan Note (Signed)
Quite a bit improved. Negative workup thus far. Agree that MRI brain would be the next step in workup though patient is unsure if she wants to do this. She has follow-up next month with neurology and she will keep this. Neurologically intact today. Given return precautions.

## 2016-06-27 NOTE — Assessment & Plan Note (Signed)
Symptoms most consistent with allergic rhinitis. Advised on over-the-counter Flonase and Claritin or Zyrtec. She'll continue to monitor.

## 2016-07-10 ENCOUNTER — Ambulatory Visit (INDEPENDENT_AMBULATORY_CARE_PROVIDER_SITE_OTHER): Payer: BLUE CROSS/BLUE SHIELD

## 2016-07-10 ENCOUNTER — Ambulatory Visit (INDEPENDENT_AMBULATORY_CARE_PROVIDER_SITE_OTHER): Payer: BLUE CROSS/BLUE SHIELD | Admitting: Family Medicine

## 2016-07-10 ENCOUNTER — Encounter: Payer: Self-pay | Admitting: Family Medicine

## 2016-07-10 VITALS — BP 114/76 | HR 86 | Temp 98.1°F | Ht 65.0 in | Wt 187.8 lb

## 2016-07-10 DIAGNOSIS — M25571 Pain in right ankle and joints of right foot: Secondary | ICD-10-CM | POA: Diagnosis not present

## 2016-07-10 DIAGNOSIS — S99911A Unspecified injury of right ankle, initial encounter: Secondary | ICD-10-CM

## 2016-07-10 DIAGNOSIS — Z3200 Encounter for pregnancy test, result unknown: Secondary | ICD-10-CM | POA: Diagnosis not present

## 2016-07-10 DIAGNOSIS — S99921A Unspecified injury of right foot, initial encounter: Secondary | ICD-10-CM | POA: Diagnosis not present

## 2016-07-10 DIAGNOSIS — M79671 Pain in right foot: Secondary | ICD-10-CM | POA: Diagnosis not present

## 2016-07-10 LAB — POCT URINE PREGNANCY: Preg Test, Ur: NEGATIVE

## 2016-07-10 NOTE — Patient Instructions (Signed)
Nice to see you. We will obtain x-rays to evaluate for fracture. If no fracture please continue to wear the Ace bandage and elevate and ice her foot and ankle. You can use ibuprofen as needed up to 800 mg every 8 hours. If you develop numbness, weakness, worsening pain, or any new or changing symptoms please seek medical attention.

## 2016-07-10 NOTE — Assessment & Plan Note (Signed)
Suspect likely right ankle sprain though with tenderness over fifth metatarsal we'll obtain x-rays of her ankle and foot. Discussed continuing Ace bandage, elevation, ice, and ibuprofen as needed pending x-ray results. She'll continue to monitor. Given return precautions.

## 2016-07-10 NOTE — Progress Notes (Signed)
Patient ID: [name], female   DOB: 1991/09/05, 25 y.o.   MRN: [id number]  Hannah AlarEric Nahmir Zeidman, MD Phone: [phone]  [name] is a 25 y.o. female who presents today for same-day visit.  Patient notes on Friday she inverted her right ankle. 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 She notes being unable to walk on it immediately after the event though at the end of the meeting she was able to get up and walk with her shoes on. Could not put weight on it over the weekend. Today is able to put more weight on it. She has been using compression and elevation. Ice on Friday and Saturday. Ibuprofen. Notes it hurts in the lateral portion soft tissues of her ankle.   ROS see history of present illness  Objective  Physical Exam Filed Vitals:   07/10/16 0833  BP: 114/76  Pulse: 86  Temp: 98.1 F (36.7 C)    BP Readings from Last 3 Encounters:  07/10/16 114/76  06/27/16 108/66  04/18/16 118/80   Wt Readings from Last 3 Encounters:  07/10/16 187 lb 12.8 oz (85.186 kg)  06/27/16 184 lb 6.4 oz (83.643 kg)  04/18/16 184 lb 4 oz (83.575 kg)    Physical Exam  Constitutional: She is well-developed, well-nourished, and in no distress.  Cardiovascular: Normal rate, regular rhythm and normal heart sounds.   Pulmonary/Chest: Effort normal and breath sounds normal.  Musculoskeletal:  Left ankle with no tenderness to palpation or swelling or bruising, right ankle with bruising inferior to the lateral malleolus, no lateral malleoli are or medial malleoli or tenderness, there is tenderness of the fifth head of the metatarsal, no tenderness over the navicular, 2+ radial pulses bilaterally, bilateral feet with capillary refill less than 2 seconds  Neurological: She is alert.  5 out of 5 strength bilateral plantar flexion, dorsiflexion, inversion, and eversion, sensation to light touch intact bilateral lower extremities, slight limp  favoring right foot  Skin: Skin is warm and dry.     Assessment/Plan: Please see individual problem list.  Right ankle injury Suspect likely right ankle sprain though with tenderness over fifth metatarsal we'll obtain x-rays of her ankle and foot. Discussed continuing Ace bandage, elevation, ice, and ibuprofen as needed pending x-ray results. She'll continue to monitor. Given return precautions.    Orders Placed This Encounter  Procedures  . DG Ankle Complete Right    Standing Status: Future     Number of Occurrences: 1     Standing Expiration Date: 09/10/2017    Order Specific Question:  Reason for Exam (SYMPTOM  OR DIAGNOSIS REQUIRED)    Answer:  right ankle injury, inversion, tenderness over the fifth head of metatarsal    Order Specific Question:  Is the patient pregnant?    Answer:  No    Order Specific Question:  Preferred [facility]?    [facility]  . DG Foot Complete Right    Standing Status: Future     Number of Occurrences: 1     Standing Expiration Date: 09/10/2017    Order Specific Question:  Reason for Exam (SYMPTOM  OR DIAGNOSIS REQUIRED)    Answer:  right ankle injury, inversion, tenderness over the fifth head of metatarsal    Order Specific Question:  Is the patient pregnant?    Answer:  No    Order Specific Question:  Preferred imaging location?    Answer:  [facility]  . POCT urine pregnancy    Hannah Alar, MD [facility]

## 2016-07-10 NOTE — Progress Notes (Signed)
Pre visit review using our clinic review tool, if applicable. No additional management support is needed unless otherwise documented below in the visit note. 

## 2016-07-12 ENCOUNTER — Ambulatory Visit: Payer: BLUE CROSS/BLUE SHIELD | Admitting: Neurology

## 2016-07-12 DIAGNOSIS — Z029 Encounter for administrative examinations, unspecified: Secondary | ICD-10-CM

## 2016-09-04 DIAGNOSIS — Z01419 Encounter for gynecological examination (general) (routine) without abnormal findings: Secondary | ICD-10-CM | POA: Diagnosis not present

## 2016-09-04 DIAGNOSIS — N941 Unspecified dyspareunia: Secondary | ICD-10-CM | POA: Diagnosis not present

## 2016-09-04 DIAGNOSIS — A609 Anogenital herpesviral infection, unspecified: Secondary | ICD-10-CM | POA: Diagnosis not present

## 2016-09-04 DIAGNOSIS — Z130 Encounter for screening for diseases of the blood and blood-forming organs and certain disorders involving the immune mechanism: Secondary | ICD-10-CM | POA: Diagnosis not present

## 2016-09-04 DIAGNOSIS — Z1389 Encounter for screening for other disorder: Secondary | ICD-10-CM | POA: Diagnosis not present

## 2016-09-04 DIAGNOSIS — R5383 Other fatigue: Secondary | ICD-10-CM | POA: Diagnosis not present

## 2016-10-03 DIAGNOSIS — N8110 Cystocele, unspecified: Secondary | ICD-10-CM | POA: Diagnosis not present

## 2016-11-01 ENCOUNTER — Telehealth: Payer: Self-pay | Admitting: *Deleted

## 2016-11-01 ENCOUNTER — Ambulatory Visit (INDEPENDENT_AMBULATORY_CARE_PROVIDER_SITE_OTHER): Payer: BLUE CROSS/BLUE SHIELD | Admitting: Family Medicine

## 2016-11-01 ENCOUNTER — Telehealth: Payer: Self-pay | Admitting: Family Medicine

## 2016-11-01 ENCOUNTER — Encounter: Payer: Self-pay | Admitting: Family Medicine

## 2016-11-01 DIAGNOSIS — G43909 Migraine, unspecified, not intractable, without status migrainosus: Secondary | ICD-10-CM | POA: Insufficient documentation

## 2016-11-01 DIAGNOSIS — J0100 Acute maxillary sinusitis, unspecified: Secondary | ICD-10-CM | POA: Diagnosis not present

## 2016-11-01 DIAGNOSIS — N926 Irregular menstruation, unspecified: Secondary | ICD-10-CM | POA: Diagnosis not present

## 2016-11-01 LAB — HCG, QUANTITATIVE, PREGNANCY: Quantitative HCG: 7.77 m[IU]/mL

## 2016-11-01 LAB — POCT URINE PREGNANCY: Preg Test, Ur: NEGATIVE

## 2016-11-01 NOTE — Telephone Encounter (Signed)
Please advise 

## 2016-11-01 NOTE — Telephone Encounter (Signed)
Please see other phone note

## 2016-11-01 NOTE — Telephone Encounter (Signed)
Called and spoke with patient regarding beta hCG test. It is elevated indicating pregnancy. I discussed this with her. I advised that given her allergies and pregnancy status there are no good antibiotic regimens for her sinus issues. I advised use of Tylenol and Flonase to start with. She can add Claritin if not having benefit from Tylenol and Flonase. I discussed that in general bacterial sinus infections will eventually get better on their own. We discussed having her contact her gynecologist to set up an appointment tomorrow. I advised that if she develops any vaginal bleeding or abdominal pain she needs to seek medical attention. Also if she develops fever or worsening symptoms.

## 2016-11-01 NOTE — Assessment & Plan Note (Addendum)
Patient's upper respiratory symptoms most consistent with sinusitis. Given her history of allergies to penicillin and azithromycin I would prefer to treat with doxycycline. She had a negative pregnancy test in the office though given the potential for treating with doxycycline we are going to send a beta-hCG to confirm that she is not pregnant. If this is negative we will send in doxycycline.Doreatha Lew. She'll continue supportive care. Given return precautions.

## 2016-11-01 NOTE — Progress Notes (Signed)
Pre visit review using our clinic review tool, if applicable. No additional management support is needed unless otherwise documented below in the visit note. 

## 2016-11-01 NOTE — Progress Notes (Signed)
Hannah AlarEric Shamari Lofquist, MD Phone: [phone]  Hannah MoronSarah E Leach is a 25 y.o. female who presents today for same-day visit.  Patient reports 2 weeks ago developed some headache and nausea. Went to sleep and this improved. Has a history of migraines and has had intermittent migraine since then. She also notes she has developed some significant sinus congestion and pressure sensation. Notes some rhinorrhea and postnasal drip. No fevers. No numbness or weakness. No she was constipated though had a large BM yesterday. No abdominal pain, diarrhea, or vomiting. Has been trying ibuprofen for her migraines though this has been of no benefit. She additionally notes that her last period was the beginning of September. She reports 2-3 negative pregnancy tests at home. She typically has a menstrual cycle once a month but they only last for 1-2 days. This has been normal for her over the last year. She is sexually active and not on any birth control.  PMH: nonsmoker.   ROS see history of present illness  Objective  Physical Exam Vitals:   11/01/16 0937  BP: 118/74  Pulse: 75  Temp: 97.6 F (36.4 C)    BP Readings from Last 3 Encounters:  11/01/16 118/74  07/10/16 114/76  06/27/16 108/66   Wt Readings from Last 3 Encounters:  11/01/16 197 lb (89.4 kg)  07/10/16 187 lb 12.8 oz (85.2 kg)  06/27/16 184 lb 6.4 oz (83.6 kg)    Physical Exam  Constitutional: She is well-developed, well-nourished, and in no distress.  HENT:  Head: Normocephalic and atraumatic.  Mouth/Throat: Oropharynx is clear and moist. No oropharyngeal exudate.  Normal TMs bilaterally  Eyes: Conjunctivae are normal. Pupils are equal, round, and reactive to light.  Neck: Neck supple.  Cardiovascular: Normal rate, regular rhythm and normal heart sounds.   Pulmonary/Chest: Effort normal and breath sounds normal.  Abdominal: Soft. Bowel sounds are normal. She exhibits no distension. There is no tenderness. There is no rebound and no  guarding.  Musculoskeletal: She exhibits no edema.  Lymphadenopathy:    She has no cervical adenopathy.  Neurological: She is alert.  CN 2-12 intact, 5/5 strength in bilateral biceps, triceps, grip, quads, hamstrings, plantar and dorsiflexion, sensation to light touch intact in bilateral UE and LE, normal gait  Skin: Skin is warm and dry.     Assessment/Plan: Please see individual problem list.  Sinusitis, acute maxillary Patient's upper respiratory symptoms most consistent with sinusitis. Given her history of allergies to penicillin and azithromycin I would prefer to treat with doxycycline. She had a negative pregnancy test in the office though given the potential for treating with doxycycline we are going to send a beta-hCG to confirm that she is not pregnant. If this is negative we will send in doxycycline.Doreatha Lew. She'll continue supportive care. Given return precautions.  Migraine With intermittent migraines over the last week. Likely worsened by her sinusitis. I would like to treat with Phenergan. We're going to await beta hCG to check on pregnancy. If this is negative we will send Phenergan. She'll continue to monitor. Given return precautions.  Missed period Patient with a single missed period. Urine pregnancy test in the office was negative. Given potential for treatment with doxycycline and we are going to send in a beta hCG to confirm that this is negative. Suspect missed. Could be related to anovulation given her weight. If she has a negative pregnancy test could consider evaluation with gynecology.   Orders Placed This Encounter  Procedures  . B-HCG Quant  . POCT urine  pregnancy    No orders of the defined types were placed in this encounter.   Hannah AlarEric Hannah Grennan, MD [facility]

## 2016-11-01 NOTE — Telephone Encounter (Signed)
Pt requested a update on her urine test, she requested a Rx for a antibiotic for her sinus infection  Pt contact [phone]

## 2016-11-01 NOTE — Assessment & Plan Note (Addendum)
Patient with a single missed period. Urine pregnancy test in the office was negative. Given potential for treatment with doxycycline and we are going to send in a beta hCG to confirm that this is negative. Suspect missed. Could be related to anovulation given her weight. If she has a negative pregnancy test could consider evaluation with gynecology.

## 2016-11-01 NOTE — Patient Instructions (Signed)
Nice to see you. We are going to obtain a blood test to see if you're pregnant. Once this returns we will determine what antibiotics play she will on and what nausea medicine to give you. If you develop worsening headaches, numbness, weakness, fevers, or any new or changing symptoms please seek medical attention.

## 2016-11-01 NOTE — Assessment & Plan Note (Addendum)
With intermittent migraines over the last week. Likely worsened by her sinusitis. I would like to treat with Phenergan. We're going to await beta hCG to check on pregnancy. If this is negative we will send Phenergan. She'll continue to monitor. Given return precautions.

## 2016-11-09 DIAGNOSIS — Z3202 Encounter for pregnancy test, result negative: Secondary | ICD-10-CM | POA: Diagnosis not present

## 2016-11-09 DIAGNOSIS — N912 Amenorrhea, unspecified: Secondary | ICD-10-CM | POA: Diagnosis not present

## 2016-11-20 DIAGNOSIS — O200 Threatened abortion: Secondary | ICD-10-CM | POA: Diagnosis not present

## 2016-11-21 ENCOUNTER — Ambulatory Visit (INDEPENDENT_AMBULATORY_CARE_PROVIDER_SITE_OTHER): Payer: BLUE CROSS/BLUE SHIELD | Admitting: Endocrinology

## 2016-11-21 ENCOUNTER — Encounter: Payer: Self-pay | Admitting: Endocrinology

## 2016-11-21 VITALS — BP 98/68 | HR 98 | Ht 65.0 in | Wt 201.4 lb

## 2016-11-21 DIAGNOSIS — E063 Autoimmune thyroiditis: Secondary | ICD-10-CM

## 2016-11-21 DIAGNOSIS — E038 Other specified hypothyroidism: Secondary | ICD-10-CM | POA: Diagnosis not present

## 2016-11-21 DIAGNOSIS — E049 Nontoxic goiter, unspecified: Secondary | ICD-10-CM | POA: Diagnosis not present

## 2016-11-21 NOTE — Progress Notes (Signed)
Patient ID: [name], female   DOB: 1991/06/17, 25 y.o.   MRN: [id number]             Reason for Appointment:  Hypothyroidism, new visit  Referring physician: Dr. Birdie SonsSonnenberg   History of Present Illness:   Hypothyroidism was first diagnosed in 9/17  Patient has had symptoms of fatigue for about 4 years.  This had been fairly consistent and not significantly more recently She also has had an overall weight gain of about 40 pounds although this has been variable and at one point about 2 years ago had lost about 20 pounds with diet and exercise She has had some tendency to hair loss over the last year along with difficulty concentrating and memory Does have mild cold sensitivity but more in her feet Also having some hot flashes in the last 3 months  She has had periodic thyroid functions and these have been usually normal In 9/17 her gynecologist checked her TSH and it was about 6. She is now coming here for further evaluation She is concerned that she has had a family history of hypothyroidism         Patient's weight history is as follows:  Wt Readings from Last 3 Encounters:  11/21/16 201 lb 6.4 oz (91.4 kg)  11/01/16 197 lb (89.4 kg)  07/10/16 187 lb 12.8 oz (85.2 kg)    Thyroid function results have been as follows:  TSH done in 9/17 from outside lab was 6  Lab Results  Component Value Date   TSH 1.63 05/02/2016   TSH 1.05 04/18/2016     Past Medical History:  Diagnosis Date  . History of chicken pox   . Hx of migraine headaches   . SVD (spontaneous vaginal delivery) 05/09/2012  . Urinary tract infection   . Vertigo     Past Surgical History:  Procedure Laterality Date  . TONSILLECTOMY     adenoidectomy  . WISDOM TOOTH EXTRACTION      Family History  Problem Relation Age of Onset  . Anesthesia problems Neg Hx   . Diabetes Maternal Grandmother   . Breast cancer Maternal Grandmother   . Thyroid disease Father   . Kidney disease Paternal Grandmother    . Parkinson's disease Maternal Grandmother   . Healthy Brother   . Healthy Daughter     Social History:  reports that she has never smoked. She has never used smokeless tobacco. She reports that she does not drink alcohol or use drugs.  Allergies:  Allergies  Allergen Reactions  . Erythromycin   . Penicillins Hives      Medication List       Accurate as of 11/21/16  4:21 PM. Always use your most recent med list.          valACYclovir 1000 MG tablet Commonly known as:  VALTREX Take 1 tablet by mouth 3 (three) times daily as needed.       Review of Systems:  Review of Systems  Constitutional: Positive for weight loss and malaise.  HENT: Positive for headaches.        Has history of migraines  Respiratory: Negative for shortness of breath.   Cardiovascular: Negative for leg swelling.  Gastrointestinal: Negative for constipation.  Endocrine: Positive for menstrual changes.       Since her last pregnancy 4 years ago at her menstrual cycles have been shorter and very light. Also has decreased libido   Genitourinary: Negative for frequency.  Skin: Negative for  rash.  Neurological: Negative for numbness.  Psychiatric/Behavioral: Positive for depressed mood.       She has had depressed mood for a couple of years, may tend to cry easily                Examination:    BP 98/68   Pulse 98   Ht 5\' 5"  (1.651 m)   Wt 201 lb 6.4 oz (91.4 kg)   SpO2 100%   BMI 33.51 kg/m   GENERAL:   Has generalized obesity without cushingoid features  No pallor, clubbing, lymphadenopathy or edema.    Skin:  no rash or pigmentation.  No significant alopecia seen   EYES:  No prominence of the eyes or swelling of the eyelids  ENT: Oral mucosa and tongue normal.  THYROID:  this is about 1-1/2 times normal, soft and more on the right side  HEART:  Normal  S1 and S2; no murmur or click.  CHEST:    Lungs: Vescicular breath sounds heard equally.  No crepitations/  wheeze.  ABDOMEN:  No distention.  Liver and spleen not palpable.  No other mass or tenderness.  NEUROLOGICAL: Reflexes are bilaterally  normal at biceps and ankles   JOINTS:  Normal.   Assessment:  HYPOTHYROIDISM with recent TSH of 6 However she has had nonspecific fatigue and weight gain for about 4 years along with mild symptoms of depression  Her TSH level previously had been quite normal and unlikely that all her symptoms are related to hypothyroidism  She likely has Hashimoto's thyroiditis with presence of a  small goiter and family history is positive for hypothyroidism  DEPRESSION: Although relatively mild this may be causing some of her fatigue, decreased libido and weight gain  OLIGOMENORRHEA: Has not been evaluated, consider checking prolactin in the future, recently is pregnant and evaluation will be deferred  PLAN:    Recheck TSH today and decide on treatment. Will also get baseline free T4 and free T3 levels Discussed that all her symptoms may not be from hypothyroidism Also discussed that even if she has pregnancy would be safe to use thyroid supplements like levothyroxine  Also needs to discuss her menstrual irregularities with gynecologist  Follow-up to be determined after labs are available   Consultation note sent to the PCP   [facility] CorporationKUMAR,Rasheda Ledger 11/21/2016, 4:21 PM     ADDENDUM: TSH is again about 6 with low normal free T4 and normal free T3, will start her on levothyroxine 25 g daily  Follow-up in 6 weeks  Lab Results  Component Value Date   TSH 6.330 (H) 11/21/2016

## 2016-11-22 LAB — T3, FREE: T3, Free: 3.4 pg/mL (ref 2.0–4.4)

## 2016-11-22 LAB — TSH: TSH: 6.33 u[IU]/mL — AB (ref 0.450–4.500)

## 2016-11-22 LAB — T4, FREE: FREE T4: 0.87 ng/dL (ref 0.82–1.77)

## 2016-11-23 MED ORDER — LEVOTHYROXINE SODIUM 25 MCG PO TABS: 25.0000 ug | ORAL_TABLET | Freq: Every day | ORAL | 3 refills | Status: DC

## 2016-11-25 DIAGNOSIS — E063 Autoimmune thyroiditis: Secondary | ICD-10-CM | POA: Insufficient documentation

## 2016-12-04 DIAGNOSIS — O200 Threatened abortion: Secondary | ICD-10-CM | POA: Diagnosis not present

## 2016-12-06 DIAGNOSIS — Z3201 Encounter for pregnancy test, result positive: Secondary | ICD-10-CM | POA: Diagnosis not present

## 2016-12-10 DIAGNOSIS — N939 Abnormal uterine and vaginal bleeding, unspecified: Secondary | ICD-10-CM | POA: Diagnosis not present

## 2016-12-12 DIAGNOSIS — N926 Irregular menstruation, unspecified: Secondary | ICD-10-CM | POA: Diagnosis not present

## 2016-12-18 DIAGNOSIS — N926 Irregular menstruation, unspecified: Secondary | ICD-10-CM | POA: Diagnosis not present

## 2016-12-26 ENCOUNTER — Other Ambulatory Visit (HOSPITAL_COMMUNITY): Payer: Self-pay | Admitting: Obstetrics and Gynecology

## 2016-12-26 DIAGNOSIS — E282 Polycystic ovarian syndrome: Secondary | ICD-10-CM

## 2016-12-27 ENCOUNTER — Other Ambulatory Visit (HOSPITAL_COMMUNITY): Payer: Self-pay | Admitting: Obstetrics and Gynecology

## 2016-12-27 ENCOUNTER — Telehealth: Payer: Self-pay | Admitting: Family Medicine

## 2016-12-27 DIAGNOSIS — E342 Ectopic hormone secretion, not elsewhere classified: Secondary | ICD-10-CM

## 2016-12-27 DIAGNOSIS — N83201 Unspecified ovarian cyst, right side: Secondary | ICD-10-CM

## 2016-12-27 DIAGNOSIS — E282 Polycystic ovarian syndrome: Secondary | ICD-10-CM

## 2016-12-27 DIAGNOSIS — N83202 Unspecified ovarian cyst, left side: Principal | ICD-10-CM

## 2016-12-27 DIAGNOSIS — R109 Unspecified abdominal pain: Secondary | ICD-10-CM

## 2016-12-27 NOTE — Telephone Encounter (Signed)
Pt called and stated that she would like a second opinion in regards to her ovarian cysts. She states that her current doctor is moving very slowly and is finally willing to do other testing, but she feels that it is moving to slowly. Please advise, thank you!  Call pt @ [phone]

## 2016-12-27 NOTE — Telephone Encounter (Signed)
[facility]

## 2016-12-27 NOTE — Telephone Encounter (Signed)
Please find out who her current gynecologist is and I can refer to a new gynecologist.

## 2016-12-27 NOTE — Telephone Encounter (Signed)
Please advise 

## 2016-12-28 ENCOUNTER — Emergency Department: Payer: BLUE CROSS/BLUE SHIELD

## 2016-12-28 ENCOUNTER — Encounter: Payer: Self-pay | Admitting: Emergency Medicine

## 2016-12-28 ENCOUNTER — Emergency Department
Admission: EM | Admit: 2016-12-28 | Discharge: 2016-12-28 | Disposition: A | Discharge status: Home / Self Care | Payer: BLUE CROSS/BLUE SHIELD | Attending: Emergency Medicine | Admitting: Emergency Medicine

## 2016-12-28 DIAGNOSIS — E039 Hypothyroidism, unspecified: Secondary | ICD-10-CM | POA: Diagnosis not present

## 2016-12-28 DIAGNOSIS — N760 Acute vaginitis: Secondary | ICD-10-CM | POA: Diagnosis not present

## 2016-12-28 DIAGNOSIS — B9689 Other specified bacterial agents as the cause of diseases classified elsewhere: Secondary | ICD-10-CM | POA: Diagnosis not present

## 2016-12-28 DIAGNOSIS — E282 Polycystic ovarian syndrome: Secondary | ICD-10-CM | POA: Diagnosis not present

## 2016-12-28 DIAGNOSIS — R102 Pelvic and perineal pain: Secondary | ICD-10-CM | POA: Diagnosis not present

## 2016-12-28 LAB — CBC
HCT: 38.6 % (ref 35.0–47.0)
Hemoglobin: 13.6 g/dL (ref 12.0–16.0)
MCH: 30.3 pg (ref 26.0–34.0)
MCHC: 35.1 g/dL (ref 32.0–36.0)
MCV: 86.3 fL (ref 80.0–100.0)
PLATELETS: 253 10*3/uL (ref 150–440)
RBC: 4.48 MIL/uL (ref 3.80–5.20)
RDW: 12.4 % (ref 11.5–14.5)
WBC: 6.1 10*3/uL (ref 3.6–11.0)

## 2016-12-28 LAB — URINALYSIS, COMPLETE (UACMP) WITH MICROSCOPIC
BILIRUBIN URINE: NEGATIVE
Bacteria, UA: NONE SEEN
GLUCOSE, UA: NEGATIVE mg/dL
KETONES UR: NEGATIVE mg/dL
LEUKOCYTES UA: NEGATIVE
Nitrite: NEGATIVE
PH: 5 (ref 5.0–8.0)
Protein, ur: NEGATIVE mg/dL
Specific Gravity, Urine: 1.023 (ref 1.005–1.030)

## 2016-12-28 LAB — COMPREHENSIVE METABOLIC PANEL
ALT: 34 U/L (ref 14–54)
AST: 31 U/L (ref 15–41)
Albumin: 4.2 g/dL (ref 3.5–5.0)
Alkaline Phosphatase: 61 U/L (ref 38–126)
Anion gap: 8 (ref 5–15)
BUN: 14 mg/dL (ref 6–20)
CALCIUM: 9.3 mg/dL (ref 8.9–10.3)
CHLORIDE: 103 mmol/L (ref 101–111)
CO2: 27 mmol/L (ref 22–32)
CREATININE: 0.85 mg/dL (ref 0.44–1.00)
Glucose, Bld: 98 mg/dL (ref 65–99)
Potassium: 3.6 mmol/L (ref 3.5–5.1)
Sodium: 138 mmol/L (ref 135–145)
Total Bilirubin: 0.6 mg/dL (ref 0.3–1.2)
Total Protein: 8 g/dL (ref 6.5–8.1)

## 2016-12-28 LAB — WET PREP, GENITAL
SPERM: NONE SEEN
Trich, Wet Prep: NONE SEEN
Yeast Wet Prep HPF POC: NONE SEEN

## 2016-12-28 LAB — POCT PREGNANCY, URINE: Preg Test, Ur: NEGATIVE

## 2016-12-28 LAB — CHLAMYDIA/NGC RT PCR (ARMC ONLY)
Chlamydia Tr: NOT DETECTED
N gonorrhoeae: NOT DETECTED

## 2016-12-28 LAB — LIPASE, BLOOD: LIPASE: 15 U/L (ref 11–51)

## 2016-12-28 MED ORDER — METRONIDAZOLE 500 MG PO TABS: 500.0000 mg | ORAL_TABLET | Freq: Two times a day (BID) | ORAL | 0 refills | Status: DC

## 2016-12-28 MED ORDER — HYDROMORPHONE HCL 2 MG PO TABS
2.0000 mg | ORAL_TABLET | Freq: Once | ORAL | Status: AC
  Administered 2016-12-28: 2 mg via ORAL
  Filled 2016-12-28: qty 1

## 2016-12-28 MED ORDER — KETOROLAC TROMETHAMINE 10 MG PO TABS: 10.0000 mg | ORAL_TABLET | Freq: Four times a day (QID) | ORAL | 0 refills | Status: DC | PRN

## 2016-12-28 MED ORDER — NORETHINDRONE 0.35 MG PO TABS: 1.0000 | ORAL_TABLET | Freq: Every day | ORAL | 11 refills | Status: DC

## 2016-12-28 NOTE — ED Provider Notes (Signed)
[facility] Emergency Department Provider Note        Time seen: ----------------------------------------- 2:30 PM on 12/28/2016 -----------------------------------------    I have reviewed the triage vital signs and the nursing notes.   HISTORY  Chief Complaint Abdominal Pain    HPI [name] is a 25 y.o. female who presents to the ER stating she was recently diagnosed with cysts on her ovaries. She's had persistent pelvic pain that is worse on the left side. She was given pain medicine including Vicodin and Phenergan but the medication is not controlling the pain. She has been point with her OB/GYN doctor next week but she is unable to wait until then. The past 2 days she's had severe pain in the left lower quadrant, has been nauseous but not vomited. She denies any diarrhea.   Past Medical History:  Diagnosis Date  . History of chicken pox   . Hx of migraine headaches   . SVD (spontaneous vaginal delivery) 05/09/2012  . Thyroid disease   . Urinary tract infection   . Vertigo     Patient Active Problem List   Diagnosis Date Noted  . Hypothyroidism, acquired, autoimmune 11/25/2016  . Sinusitis, acute maxillary 11/01/2016  . Migraine 11/01/2016  . Missed period 11/01/2016  . Right ankle injury 07/10/2016  . Allergic rhinitis 06/27/2016  . Numbness 03/25/2016  . SVD (spontaneous vaginal delivery) 05/09/2012    Past Surgical History:  Procedure Laterality Date  . TONSILLECTOMY     adenoidectomy  . WISDOM TOOTH EXTRACTION      Allergies Erythromycin and Penicillins  Social History Social History  Substance Use Topics  . Smoking status: Never Smoker  . Smokeless tobacco: Never Used  . Alcohol use No    Review of Systems Constitutional: Negative for fever. Cardiovascular: Negative for chest pain. Respiratory: Negative for shortness of breath. Gastrointestinal: Positive for pelvic pain Genitourinary: Negative for  dysuria. Musculoskeletal: Negative for back pain. Skin: Negative for rash. Neurological: Negative for headaches, focal weakness or numbness.  10-point ROS otherwise negative.  ____________________________________________   PHYSICAL EXAM:  VITAL SIGNS: ED Triage Vitals  Enc Vitals Group     BP 12/28/16 1149 135/81     Pulse Rate 12/28/16 1149 83     Resp 12/28/16 1149 16     Temp 12/28/16 1149 97.9 F (36.6 C)     Temp Source 12/28/16 1149 Oral     SpO2 12/28/16 1149 99 %     Weight 12/28/16 1150 200 lb (90.7 kg)     Height 12/28/16 1150 5\' 6"  (1.676 m)     Head Circumference --      Peak Flow --      Pain Score 12/28/16 1151 6     Pain Loc --      Pain Edu? --      Excl. in GC? --     Constitutional: Alert and oriented. Well appearing and in no distress. Eyes: Conjunctivae are normal. PERRL. Normal extraocular movements. ENT   Head: Normocephalic and atraumatic.   Nose: No congestion/rhinnorhea.   Mouth/Throat: Mucous membranes are moist.   Neck: No stridor. Cardiovascular: Normal rate, regular rhythm. No murmurs, rubs, or gallops. Respiratory: Normal respiratory effort without tachypnea nor retractions. Breath sounds are clear and equal bilaterally. No wheezes/rales/rhonchi. Gastrointestinal: Pelvic tenderness, worse on the left, no rebound or guarding. Normal bowel sounds. Genitourinary: Thick white and yellow purulence coming from the cervix, mild cervical inflammation, cervical motion tenderness Musculoskeletal: Nontender with normal  range of motion in all extremities. No lower extremity tenderness nor edema. Neurologic:  Normal speech and language. No gross focal neurologic deficits are appreciated.  Skin:  Skin is warm, dry and intact. No rash noted. Psychiatric: Mood and affect are normal. Speech and behavior are normal.  ___________________________________________  ED COURSE:  Pertinent labs & imaging results that were available during my care of  the patient were reviewed by me and considered in my medical decision making (see chart for details). Clinical Course   Patient is in no distress, we will assess with labs and possibly ultrasound imaging.Clinically the patient may have cervicitis.  Procedures ____________________________________________   LABS (pertinent positives/negatives)  Labs Reviewed  WET PREP, GENITAL - Abnormal; Notable for the following:       Result Value   Clue Cells Wet Prep HPF POC PRESENT (*)    WBC, Wet Prep HPF POC MODERATE (*)    All other components within normal limits  URINALYSIS, COMPLETE (UACMP) WITH MICROSCOPIC - Abnormal; Notable for the following:    Color, Urine YELLOW (*)    APPearance CLEAR (*)    Hgb urine dipstick MODERATE (*)    Squamous Epithelial / LPF 0-5 (*)    All other components within normal limits  CHLAMYDIA/NGC RT PCR ([facility] ONLY)  LIPASE, BLOOD  COMPREHENSIVE METABOLIC PANEL  CBC  POC URINE PREG, ED  POCT PREGNANCY, URINE    RADIOLOGY Images were viewed by me  Pelvic ultrasound IMPRESSION: Both ovaries are enlarged with multiple cysts measuring 2-3 cm, separated by thin septations. Differential diagnosis includes polycystic ovarian syndrome, theca lutein cysts, ovarian hyperstimulation, or less likely ovarian mucinous neoplasms. Consider correlation with serum hormone levels and tumor markers, and followup by ultrasound in 8-12 weeks. ____________________________________________  FINAL ASSESSMENT AND PLAN  Polycystic ovaries, bacterial vaginosis  Plan: Patient with labs and imaging as dictated above. Patient is in no acute distress, and apparently has polycystic ovaries based on ultrasound. We will place on low dose birth control, offered additional pain medication and encourage her to continue outpatient follow-up with GYN.   Emily FilbertWilliams, Indy Prestwood E, MD   Note: This dictation was prepared with Dragon dictation. Any transcriptional errors that  result from this process are unintentional    Emily FilbertJonathan E Kale Dols, MD 12/28/16 [id number]

## 2016-12-28 NOTE — ED Notes (Signed)
Patient in [place].

## 2016-12-28 NOTE — Telephone Encounter (Signed)
Referral to new gynecologist placed.

## 2016-12-28 NOTE — ED Triage Notes (Signed)
Patient states that she was recently diagnosed with multiple cyst on her ovaries, patient was given pain medication but the medication is not controlling the pain. Patient has appt with her OBGYN next week but states that she is unable to wait until then. For the past 2 days patient has been having severe pain in the LLQ. Patient states that she had been nauseated but has not vomited.

## 2017-01-03 ENCOUNTER — Ambulatory Visit (HOSPITAL_COMMUNITY)
Admission: RE | Admit: 2017-01-03 | Discharge: 2017-01-03 | Disposition: A | Discharge status: Home / Self Care | Payer: BLUE CROSS/BLUE SHIELD | Source: Ambulatory Visit | Attending: Obstetrics and Gynecology | Admitting: Obstetrics and Gynecology

## 2017-01-03 DIAGNOSIS — E282 Polycystic ovarian syndrome: Secondary | ICD-10-CM

## 2017-01-03 DIAGNOSIS — R0602 Shortness of breath: Secondary | ICD-10-CM | POA: Diagnosis not present

## 2017-01-03 DIAGNOSIS — E342 Ectopic hormone secretion, not elsewhere classified: Secondary | ICD-10-CM

## 2017-01-03 DIAGNOSIS — R109 Unspecified abdominal pain: Secondary | ICD-10-CM

## 2017-01-03 DIAGNOSIS — R51 Headache: Secondary | ICD-10-CM | POA: Diagnosis not present

## 2017-01-03 DIAGNOSIS — R102 Pelvic and perineal pain: Secondary | ICD-10-CM | POA: Diagnosis not present

## 2017-01-03 MED ORDER — GADOBENATE DIMEGLUMINE 529 MG/ML IV SOLN
20.0000 mL | Freq: Once | INTRAVENOUS | Status: AC | PRN
  Administered 2017-01-03: 10 mL via INTRAVENOUS

## 2017-01-11 DIAGNOSIS — N981 Hyperstimulation of ovaries: Secondary | ICD-10-CM | POA: Diagnosis not present

## 2017-01-23 ENCOUNTER — Ambulatory Visit: Payer: BLUE CROSS/BLUE SHIELD | Admitting: Endocrinology

## 2017-02-05 ENCOUNTER — Encounter: Payer: Self-pay | Admitting: Obstetrics and Gynecology

## 2017-02-12 ENCOUNTER — Encounter: Payer: Self-pay | Admitting: Endocrinology

## 2017-02-12 ENCOUNTER — Ambulatory Visit (INDEPENDENT_AMBULATORY_CARE_PROVIDER_SITE_OTHER): Payer: BLUE CROSS/BLUE SHIELD | Admitting: Endocrinology

## 2017-02-12 VITALS — BP 118/76 | HR 85 | Ht 66.0 in | Wt 205.0 lb

## 2017-02-12 DIAGNOSIS — E038 Other specified hypothyroidism: Secondary | ICD-10-CM | POA: Diagnosis not present

## 2017-02-12 DIAGNOSIS — E063 Autoimmune thyroiditis: Secondary | ICD-10-CM

## 2017-02-12 LAB — TSH: TSH: 4.05 u[IU]/mL (ref 0.35–4.50)

## 2017-02-12 LAB — T4, FREE: FREE T4: 0.7 ng/dL (ref 0.60–1.60)

## 2017-02-12 MED ORDER — LEVOTHYROXINE SODIUM 25 MCG PO TABS: ORAL_TABLET | ORAL | 3 refills | Status: DC

## 2017-02-12 NOTE — Progress Notes (Signed)
Patient ID: [name], female   DOB: December 15, 1991, 26 y.o.   MRN: [id number]             Reason for Appointment:  Hypothyroidism, follow-up visit  Referring physician: Dr. Birdie SonsSonnenberg   History of Present Illness:   Hypothyroidism was first diagnosed in 9/17  Patient has had symptoms of fatigue for about 4 years.  This had been fairly consistent and not significantly more recently She also has had an overall weight gain of about 40 pounds although this has been variable and at one point about 2 years ago had lost about 20 pounds with diet and exercise She has had some tendency to hair loss over the last year along with difficulty concentrating and memory Does have mild cold sensitivity but more in her feet  In 9/17 her gynecologist checked her TSH and it was about 6. She has been on a trial of levothyroxine 25 g daily since 11/17 She says she feels a little better with energy level and also has a little more motivation.  However she has had other problems including headaches and gynecological problems which have made her feel worse at times  Has been able to focus a little better No significant change in Court sensitivity No further hair loss          Patient's weight history is as follows:  Wt Readings from Last 3 Encounters:  02/12/17 205 lb (93 kg)  12/28/16 200 lb (90.7 kg)  11/21/16 201 lb 6.4 oz (91.4 kg)    Thyroid function results have been as follows:  TSH done in 9/17 from outside lab was 6  Lab Results  Component Value Date   FREET4 0.70 02/12/2017   FREET4 0.87 11/21/2016   TSH 4.05 02/12/2017   TSH 6.330 (H) 11/21/2016   TSH 1.63 05/02/2016     Past Medical History:  Diagnosis Date  . History of chicken pox   . Hx of migraine headaches   . SVD (spontaneous vaginal delivery) 05/09/2012  . Thyroid disease   . Urinary tract infection   . Vertigo     Past Surgical History:  Procedure Laterality Date  . TONSILLECTOMY     adenoidectomy  . WISDOM  TOOTH EXTRACTION      Family History  Problem Relation Age of Onset  . Diabetes Maternal Grandmother   . Breast cancer Maternal Grandmother   . Parkinson's disease Maternal Grandmother   . Thyroid disease Father   . Kidney disease Paternal Grandmother   . Thyroid disease Paternal Grandmother   . Healthy Brother   . Healthy Daughter   . Anesthesia problems Neg Hx     Social History:  reports that she has never smoked. She has never used smokeless tobacco. She reports that she does not drink alcohol or use drugs.  Allergies:  Allergies  Allergen Reactions  . Erythromycin   . Penicillins Hives    Allergies as of 02/12/2017      Reactions   Erythromycin    Penicillins Hives      Medication List       Accurate as of 02/12/17  9:36 PM. Always use your most recent med list.          ergocalciferol 50000 units capsule Commonly known as:  VITAMIN D2 Take 50,000 Units by mouth once a week.   ketorolac 10 MG tablet Commonly known as:  TORADOL Take 1 tablet (10 mg total) by mouth every 6 (six) hours as needed.  levothyroxine 25 MCG tablet Commonly known as:  SYNTHROID Take 1 tablet (25 mcg total) by mouth daily before breakfast.   metroNIDAZOLE 500 MG tablet Commonly known as:  FLAGYL Take 1 tablet (500 mg total) by mouth 2 (two) times daily.   SRONYX 0.1-20 MG-MCG tablet Generic drug:  levonorgestrel-ethinyl estradiol Take 1 tablet by mouth daily.   valACYclovir 1000 MG tablet Commonly known as:  VALTREX Take 1 tablet by mouth 3 (three) times daily as needed.       Review of Systems:  Review of Systems  HENT:       Migraines   She is starting to get menstrual cycles with birth-control pills.  She thinks she has had lab work done to evaluate her menstrual cycles but no reports available             Examination:    BP 118/76   Pulse 85   Ht 5\' 6"  (1.676 m)   Wt 205 lb (93 kg)   SpO2 98%   BMI 33.09 kg/m    THYROID:  Right lobe about 1-1/2 times  normal, soft, left lobe just palpable  Reflexes are bilaterally  normal at biceps     Assessment:  HYPOTHYROIDISM with baseline TSH of 6  Although she has had fatigue for various reasons and this has been long-standing she thinks she feels a little better with starting levothyroxine supplements Also she is having more motivation and increased focusing of her thoughts Her TSH is improved at about 4 today  OLIGOMENORRHEA: Has been evaluated and treated by gynecologist, no reports available    PLAN:   She will continue 25 g of levothyroxine but try taking 1-1/2 tablet now Will review reports of her gynecological evaluation    [name] 02/12/2017, 9:36 PM

## 2017-02-14 ENCOUNTER — Encounter: Payer: Self-pay | Admitting: Family Medicine

## 2017-02-14 ENCOUNTER — Encounter: Payer: Self-pay | Admitting: Surgical

## 2017-02-14 ENCOUNTER — Ambulatory Visit (INDEPENDENT_AMBULATORY_CARE_PROVIDER_SITE_OTHER): Payer: BLUE CROSS/BLUE SHIELD | Admitting: Family Medicine

## 2017-02-14 VITALS — BP 124/78 | HR 88 | Temp 98.4°F | Ht 66.0 in | Wt 204.6 lb

## 2017-02-14 DIAGNOSIS — G43009 Migraine without aura, not intractable, without status migrainosus: Secondary | ICD-10-CM | POA: Diagnosis not present

## 2017-02-14 MED ORDER — TOPIRAMATE 25 MG PO TABS: 25.0000 mg | ORAL_TABLET | Freq: Two times a day (BID) | ORAL | 1 refills | Status: DC

## 2017-02-14 MED ORDER — HYDROCODONE-ACETAMINOPHEN 5-325 MG PO TABS: 1.0000 | ORAL_TABLET | Freq: Four times a day (QID) | ORAL | 0 refills | Status: DC | PRN

## 2017-02-14 MED ORDER — PROMETHAZINE HCL 25 MG PO TABS: 25.0000 mg | ORAL_TABLET | Freq: Three times a day (TID) | ORAL | 0 refills | Status: DC | PRN

## 2017-02-14 NOTE — Patient Instructions (Addendum)
START TOPAMAX AT NIGHT FOR ONE WEEK BEFORE INCREASING TO TWICE DAILY.

## 2017-02-14 NOTE — Progress Notes (Signed)
Pre visit review using our clinic review tool, if applicable. No additional management support is needed unless otherwise documented below in the visit note. 

## 2017-02-16 ENCOUNTER — Encounter: Payer: Self-pay | Admitting: Family Medicine

## 2017-02-16 NOTE — Progress Notes (Signed)
History of Present Illness:   [name] is a 26 y.o. female who presents for evaluation of headache. Symptoms began about a few days ago. Rapidity of onset was gradual. The patient gets headaches frequently. The headache is described as moderate, dull and throbbing and is frontal in location.  The patient rates the pain a 4 on a scale from 1 to 10. Precipitating factors include none which have been determined. The headache was not preceded by an aura. Associated symptoms which are present include increased pelvic discomfort associated with menses. The patient denies dizziness, loss of balance, muscle weakness, numbness of extremities, speech difficulties and vision problems. Home treatment has included acetaminophen and ibuprofen, darkening the room and resting with inadequate improvement. Other history includes: migraine headaches diagnosed in the past, allergic rhinitis, occasional episodes of sinusitis and possible PCOS.    PMHx, SurgHx, SocialHx, Medications, and Allergies were reviewed in the Visit Navigator and updated as appropriate.  Past Medical History:  Diagnosis Date  . History of chicken pox   . Hx of migraine headaches   . SVD (spontaneous vaginal delivery) 05/09/2012  . Thyroid disease   . Urinary tract infection   . Vertigo     Allergies  Allergen Reactions  . Erythromycin   . Penicillins Hives    Current Medications:   Prior to Admission medications   Medication Sig Start Date End Date Taking? Authorizing Provider  levothyroxine (SYNTHROID) 25 MCG tablet 1-1/2 tablets daily 02/12/17  Yes Reather Littler, MD  SRONYX 0.1-20 MG-MCG tablet Take 1 tablet by mouth daily. 01/11/17  Yes Historical Provider, MD  valACYclovir (VALTREX) 1000 MG tablet Take 1 tablet by mouth 3 (three) times daily as needed. 10/04/16  Yes Historical Provider, MD    Review of Systems:    No no dizziness. No prolonged cough. No dyspnea or chest pain on exertion. No change in bowel habits, black or  bloody stools.  No urinary tract symptoms. No new or unusual musculoskeletal symptoms. No edema.   Vitals:   Vitals:   02/14/17 1410  BP: 124/78  Pulse: 88  Temp: 98.4 F (36.9 C)  TempSrc: Oral  SpO2: 97%  Weight: 204 lb 9.6 oz (92.8 kg)  Height: 5\' 6"  (1.676 m)     Body mass index is 33.02 kg/m.   Physical Exam:    General appearance: Alert, cooperative, appears stated age and tearful. Head: Normocephalic, without obvious abnormality, atraumatic. Eyes: Conjunctivae/corneas clear. PERRL, EOM's intact. Fundi benign. Ears: Normal TM's and external ear canals both ears. Nose: Nares normal. Septum midline. Mucosa normal. No drainage or sinus tenderness. Throat: Lips, mucosa, and tongue normal; teeth and gums normal. Lungs: Clear to auscultation bilaterally. Heart: Regular rate and rhythm, S1, S2 normal, no murmur, click, rub or gallop. Abdomen: Soft, non-tender; bowel sounds normal; no masses,  no organomegaly. Extremities: Extremities normal, atraumatic, no cyanosis or edema. Pulses: 2+ and symmetric. Skin: Skin color, texture, turgor normal. No rashes or lesions. Neurologic: Alert and oriented X 3, normal strength and tone. Normal symmetric. reflexes. Normal coordination and gait.   Assessment and Plan:   [name] was seen today for acute visit.  Diagnoses and all orders for this visit:  Migraine without aura and without status migrainosus, not intractable Comments: The patient's presentation today is not c/w sinusitis, but a migraine. She has a Hx of the same and is suffering from them > 2-3 times per week. She already took large doses of NSAIDs today for her pain without relief.  Will treat acutely by offering Norco, Phenergan combination for patient to take when she gets home. I encouraged her to hydrate with caffeine containing product to enhance the effect of above treatment today. Sleep will hopefully help her to break this cycle. We then reviewed abortive migraine  treatment versus suppressive treatment.  She would like to try Topamax. Reviewed expectations, side-effects. She should follow up with her PCP in 1-2 weeks. Work note provided.  Orders: -     HYDROcodone-acetaminophen (NORCO/VICODIN) 5-325 MG tablet; Take 1 tablet by mouth every 6 (six) hours as needed for moderate pain. -     promethazine (PHENERGAN) 25 MG tablet; Take 1 tablet (25 mg total) by mouth every 8 (eight) hours as needed for nausea or vomiting. -     topiramate (TOPAMAX) 25 MG tablet; Take 1 tablet (25 mg total) by mouth 2 (two) times daily.   . Reviewed expectations re: course of current medical issues. . Discussed self-management of symptoms. . Outlined signs and symptoms indicating need for more acute intervention. . Patient verbalized understanding and all questions were answered. . See orders for this visit as documented in the electronic medical record. . Patient received an After Visit Summary.   Helane RimaErica Hayes Czaja, D.O. [facility], [facility]

## 2017-03-06 DIAGNOSIS — N83202 Unspecified ovarian cyst, left side: Secondary | ICD-10-CM | POA: Diagnosis not present

## 2017-03-06 DIAGNOSIS — O034 Incomplete spontaneous abortion without complication: Secondary | ICD-10-CM | POA: Diagnosis not present

## 2017-03-06 DIAGNOSIS — E282 Polycystic ovarian syndrome: Secondary | ICD-10-CM | POA: Diagnosis not present

## 2017-03-06 DIAGNOSIS — N981 Hyperstimulation of ovaries: Secondary | ICD-10-CM | POA: Diagnosis not present

## 2017-03-21 ENCOUNTER — Other Ambulatory Visit: Payer: Self-pay | Admitting: Endocrinology

## 2017-04-23 ENCOUNTER — Other Ambulatory Visit: Payer: Self-pay | Admitting: Family Medicine

## 2017-04-23 ENCOUNTER — Other Ambulatory Visit (INDEPENDENT_AMBULATORY_CARE_PROVIDER_SITE_OTHER): Payer: BLUE CROSS/BLUE SHIELD

## 2017-04-23 DIAGNOSIS — G43009 Migraine without aura, not intractable, without status migrainosus: Secondary | ICD-10-CM

## 2017-04-23 DIAGNOSIS — E063 Autoimmune thyroiditis: Secondary | ICD-10-CM

## 2017-04-23 LAB — TSH: TSH: 2.64 u[IU]/mL (ref 0.35–4.50)

## 2017-04-23 LAB — T4, FREE: FREE T4: 0.61 ng/dL (ref 0.60–1.60)

## 2017-04-25 ENCOUNTER — Ambulatory Visit (INDEPENDENT_AMBULATORY_CARE_PROVIDER_SITE_OTHER): Payer: BLUE CROSS/BLUE SHIELD | Admitting: Endocrinology

## 2017-04-25 VITALS — BP 132/70 | HR 75 | Temp 98.1°F | Ht 66.0 in | Wt 195.0 lb

## 2017-04-25 DIAGNOSIS — E063 Autoimmune thyroiditis: Secondary | ICD-10-CM

## 2017-04-25 NOTE — Progress Notes (Signed)
Patient ID: [name], female   DOB: [birth year], 26 y.o.   MRN: [id number]             Reason for Appointment:  Hypothyroidism, follow-up visit  Referring physician: Dr. Birdie Sons   History of Present Illness:   Hypothyroidism was first diagnosed in 9/17  Patient has had symptoms of fatigue for about 4 years.  This had been fairly consistent and not significantly more recently She also has had an overall weight gain of about 40 pounds although this has been variable and at one point about 2 years ago had lost about 20 pounds with diet and exercise She has had some tendency to hair loss over the last year along with difficulty concentrating and memory Does have mild cold sensitivity but more in her feet  In 9/17 her gynecologist checked her TSH and it was about 6. She has been on a trial of levothyroxine daily since 11/17, initially 25 g Initially with starting the supplement she felt a little better with energy level and also has  more motivation.   Has been able to focus a little better No further hair loss, also does not think she is feeling cold as before  Since her TSH was still high normal she was told to increase her levothyroxine up to 37.5 g in February but she does not feel any different with this  However she has had other problems including headaches and gynecological problems which have made her feel worse recently  Patient's weight history is as follows:  Wt Readings from Last 3 Encounters:  04/25/17 195 lb (88.5 kg)  02/14/17 204 lb 9.6 oz (92.8 kg)  02/12/17 205 lb (93 kg)    Thyroid function results have been as follows:  TSH done in 9/17 from outside lab was 6  Lab Results  Component Value Date   FREET4 0.61 04/23/2017   FREET4 0.70 02/12/2017   FREET4 0.87 11/21/2016   TSH 2.64 04/23/2017   TSH 4.05 02/12/2017   TSH 6.330 (H) 11/21/2016     Past Medical History:  Diagnosis Date  . History of chicken pox   . Hx of migraine headaches   .  SVD (spontaneous vaginal delivery) 05/09/2012  . Thyroid disease   . Urinary tract infection   . Vertigo     Past Surgical History:  Procedure Laterality Date  . TONSILLECTOMY     adenoidectomy  . WISDOM TOOTH EXTRACTION      Family History  Problem Relation Age of Onset  . Diabetes Maternal Grandmother   . Breast cancer Maternal Grandmother   . Parkinson's disease Maternal Grandmother   . Thyroid disease Father   . Kidney disease Paternal Grandmother   . Thyroid disease Paternal Grandmother   . Healthy Brother   . Healthy Daughter   . Anesthesia problems Neg Hx     Social History:  reports that she has never smoked. She has never used smokeless tobacco. She reports that she does not drink alcohol or use drugs.  Allergies:  Allergies  Allergen Reactions  . Erythromycin   . Penicillins Hives    Allergies as of 04/25/2017      Reactions   Erythromycin    Penicillins Hives      Medication List       Accurate as of 04/25/17  9:35 AM. Always use your most recent med list.          HYDROcodone-acetaminophen 5-325 MG tablet Commonly known as:  NORCO/VICODIN Take  1 tablet by mouth every 6 (six) hours as needed for moderate pain.   levothyroxine 25 MCG tablet Commonly known as:  SYNTHROID 1-1/2 tablets daily   levothyroxine 25 MCG tablet Commonly known as:  SYNTHROID, LEVOTHROID TAKE 1 TABLET (25 MCG TOTAL) BY MOUTH DAILY BEFORE BREAKFAST.   promethazine 25 MG tablet Commonly known as:  PHENERGAN Take 1 tablet (25 mg total) by mouth every 8 (eight) hours as needed for nausea or vomiting.   SRONYX 0.1-20 MG-MCG tablet Generic drug:  levonorgestrel-ethinyl estradiol Take 1 tablet by mouth daily.   topiramate 25 MG tablet Commonly known as:  TOPAMAX TAKE 1 TABLET (25 MG TOTAL) BY MOUTH 2 (TWO) TIMES DAILY.   valACYclovir 1000 MG tablet Commonly known as:  VALTREX Take 1 tablet by mouth 3 (three) times daily as needed.       Review of Systems:  Review  of Systems  HENT:       Migraines   She has polycystic ovaries as of 12/17 but she was told by her gynecologist that her hormone levels are high, no information available             Examination:    BP 132/70 (BP Location: Left Arm, Patient Position: Sitting, Cuff Size: Normal)   Pulse 75   Temp 98.1 F (36.7 C) (Oral)   Ht  (1.676 m)   Wt 195 lb (88.5 kg)   SpO2 97%   BMI 31.47 kg/m    THYROID: No significant thyroid alignment palpable  Reflexes are bilaterally  normal at biceps    Assessment:  HYPOTHYROIDISM with baseline TSH of 6  Overall since her levothyroxine supplementation has been started she has felt less fatigue, has better focus and also No cold intolerance Had a small baseline goiter which is not palpable now  With taking 37.5 mg levothyroxine her TSH is very normal  Although she has had fatigue for various reasons and this has been long-standing she thinks she feels a little better with starting levothyroxine supplements Also she is having more motivation and increased focusing of her thoughts Her TSH is improved at about 4 today  OLIGOMENORRHEA: ?  PCOS Has been evaluated and treated by gynecologist, no reports available, she says that subjectively she feels worse with stopping the birth control pill   PLAN:   She will continue 37.5 g of levothyroxine  Follow-up in 6 months Will review reports of her gynecological evaluation when available   [facility] 04/25/2017, 9:35 AM

## 2017-04-25 NOTE — Progress Notes (Signed)
Pre visit review using our clinic review tool, if applicable. No additional management support is needed unless otherwise documented below in the visit note. 

## 2017-05-02 ENCOUNTER — Encounter: Payer: Self-pay | Admitting: Family Medicine

## 2017-05-06 ENCOUNTER — Telehealth: Payer: Self-pay | Admitting: Family Medicine

## 2017-05-06 NOTE — Telephone Encounter (Signed)
Patient called to transfer care due to location. Out of courtesy for both providers I will await approval before scheduling transfer appointment for patient. Patient has seen Dr. Earlene PlaterWallace for acute visits in the past.

## 2017-05-06 NOTE — Telephone Encounter (Signed)
Can switch to us.

## 2017-05-06 NOTE — Telephone Encounter (Signed)
Okay 

## 2017-05-07 ENCOUNTER — Ambulatory Visit (INDEPENDENT_AMBULATORY_CARE_PROVIDER_SITE_OTHER): Payer: BLUE CROSS/BLUE SHIELD | Admitting: Family Medicine

## 2017-05-07 ENCOUNTER — Encounter: Payer: Self-pay | Admitting: Family Medicine

## 2017-05-07 VITALS — BP 122/76 | HR 74 | Temp 98.1°F | Ht 66.0 in | Wt 190.8 lb

## 2017-05-07 DIAGNOSIS — J309 Allergic rhinitis, unspecified: Secondary | ICD-10-CM | POA: Diagnosis not present

## 2017-05-07 DIAGNOSIS — N83202 Unspecified ovarian cyst, left side: Secondary | ICD-10-CM | POA: Diagnosis not present

## 2017-05-07 DIAGNOSIS — R891 Abnormal level of hormones in specimens from other organs, systems and tissues: Secondary | ICD-10-CM | POA: Diagnosis not present

## 2017-05-07 MED ORDER — FLUTICASONE PROPIONATE 50 MCG/ACT NA SUSP: 2.0000 | Freq: Every day | NASAL | 6 refills | Status: DC

## 2017-05-07 MED ORDER — MONTELUKAST SODIUM 10 MG PO TABS: 10.0000 mg | ORAL_TABLET | Freq: Every day | ORAL | 3 refills | Status: DC

## 2017-05-07 NOTE — Progress Notes (Signed)
[name] is a 26 y.o. female here for an acute visit.  History of Present Illness:   Insurance claims handlerAmber Agner, CMA, acting as scribe for Dr. Earlene PlaterWallace.  Sore Throat   This is a new problem. The current episode started more than 1 month ago. The problem has been gradually worsening. Neither side of throat is experiencing more pain than the other. There has been no fever. The pain is at a severity of 1/10. The pain is mild. Associated symptoms include congestion and coughing. Pertinent negatives include no abdominal pain, ear pain, neck pain, trouble swallowing or vomiting. She has had no exposure to strep or mono. She has tried nothing for the symptoms. The treatment provided no relief.   PMHx, SurgHx, SocialHx, Medications, and Allergies were reviewed in the Visit Navigator and updated as appropriate.  Current Medications:   .  levothyroxine (SYNTHROID, LEVOTHROID) 25 MCG tablet, TAKE 1 TABLET (25 MCG TOTAL) BY MOUTH DAILY BEFORE BREAKFAST., Disp: 30 tablet, Rfl: 3 .  topiramate (TOPAMAX) 25 MG tablet, TAKE 1 TABLET (25 MG TOTAL) BY MOUTH 2 (TWO) TIMES DAILY., Disp: 60 tablet, Rfl: 1 .  valACYclovir (VALTREX) 1000 MG tablet, Take 1 tablet by mouth 3 (three) times daily as needed., Disp: , Rfl: 11 .  SRONYX 0.1-20 MG-MCG tablet, Take 1 tablet by mouth daily., Disp: , Rfl: 0   Review of Systems:   Review of Systems  Constitutional: Negative for chills and fever.  HENT: Positive for congestion and sore throat. Negative for ear pain and trouble swallowing.   Eyes: Negative for blurred vision.  Respiratory: Positive for cough and sputum production.   Cardiovascular: Negative for chest pain and palpitations.  Gastrointestinal: Negative for abdominal pain, nausea and vomiting.  Genitourinary: Negative for frequency.  Musculoskeletal: Negative for back pain and neck pain.  Skin: Negative for rash.  Neurological: Negative for dizziness and loss of consciousness.  Psychiatric/Behavioral: Negative for  depression. The patient is not nervous/anxious.     Vitals:   Vitals:   05/07/17 1134  BP: 122/76  Pulse: 74  Temp: 98.1 F (36.7 C)  TempSrc: Oral  SpO2: 98%  Weight: 190 lb 12.8 oz (86.5 kg)  Height: 5\' 6"  (1.676 m)     Body mass index is 30.8 kg/m.  Physical Exam:   Physical Exam  Constitutional: She appears well-nourished.  HENT:  Head: Normocephalic and atraumatic.  Nose: Rhinorrhea present.  Mouth/Throat: Oropharynx is clear and moist.  Eyes: EOM are normal. Pupils are equal, round, and reactive to light.  Neck: Normal range of motion. Neck supple.  Cardiovascular: Normal rate, regular rhythm, normal heart sounds and intact distal pulses.   Pulmonary/Chest: Effort normal.  Abdominal: Soft.  Skin: Skin is warm.  Psychiatric: She has a normal mood and affect. Her behavior is normal.  Nursing note and vitals reviewed.  Assessment and Plan:    [name] was seen today for acute visit and sore throat.  Diagnoses and all orders for this visit:  Allergic rhinitis, unspecified seasonality, unspecified trigger -     fluticasone (FLONASE) 50 MCG/ACT nasal spray; Place 2 sprays into both nostrils daily. -     montelukast (SINGULAIR) 10 MG tablet; Take 1 tablet (10 mg total) by mouth at bedtime.   . Reviewed expectations re: course of current medical issues. . Discussed self-management of symptoms. . Outlined signs and symptoms indicating need for more acute intervention. . Patient verbalized understanding and all questions were answered. . See orders for this visit as  documented in the electronic medical record. . Patient received an After-Visit Summary.  CMA served as Neurosurgeon during this visit. History, Physical, and Plan performed by medical provider. Documentation and orders reviewed and attested to. Helane Rima, D.O.  Helane Rima, D.O. Family [facility], [facility] 05/07/2017   Future Appointments Date Time Provider Department Center  10/24/2017  8:00 AM Reather Littler, MD [id number] None

## 2017-05-31 ENCOUNTER — Other Ambulatory Visit: Payer: Self-pay

## 2017-05-31 DIAGNOSIS — G43009 Migraine without aura, not intractable, without status migrainosus: Secondary | ICD-10-CM

## 2017-05-31 MED ORDER — TOPIRAMATE 25 MG PO TABS: 25.0000 mg | ORAL_TABLET | Freq: Two times a day (BID) | ORAL | 0 refills | Status: DC

## 2017-06-03 DIAGNOSIS — E039 Hypothyroidism, unspecified: Secondary | ICD-10-CM | POA: Diagnosis not present

## 2017-06-03 DIAGNOSIS — R891 Abnormal level of hormones in specimens from other organs, systems and tissues: Secondary | ICD-10-CM | POA: Diagnosis not present

## 2017-06-03 DIAGNOSIS — E559 Vitamin D deficiency, unspecified: Secondary | ICD-10-CM | POA: Diagnosis not present

## 2017-06-07 ENCOUNTER — Other Ambulatory Visit: Payer: BLUE CROSS/BLUE SHIELD

## 2017-06-12 ENCOUNTER — Ambulatory Visit: Payer: BLUE CROSS/BLUE SHIELD | Admitting: Endocrinology

## 2017-07-04 ENCOUNTER — Other Ambulatory Visit: Payer: Self-pay | Admitting: Endocrinology

## 2017-08-08 IMAGING — US US PELVIS COMPLETE
1 series · 13 of 25 positions shown · non-contrast
Comparison: None

CLINICAL DATA: Progressive left-sided pelvic pain for 3 weeks.

EXAM:
TRANSABDOMINAL AND TRANSVAGINAL ULTRASOUND OF PELVIS
TECHNIQUE: Both transabdominal and transvaginal ultrasound examinations of the
pelvis were performed. Transabdominal technique was performed for
global imaging of the pelvis including uterus, ovaries, adnexal
regions, and pelvic cul-de-sac. It was necessary to proceed with
endovaginal exam following the transabdominal exam to visualize the
endometrium and ovaries.

[Series 1: us pelvis complete · 0.20mm/px · 114 acquisitions, 13 frames shown]
[im 1/114]
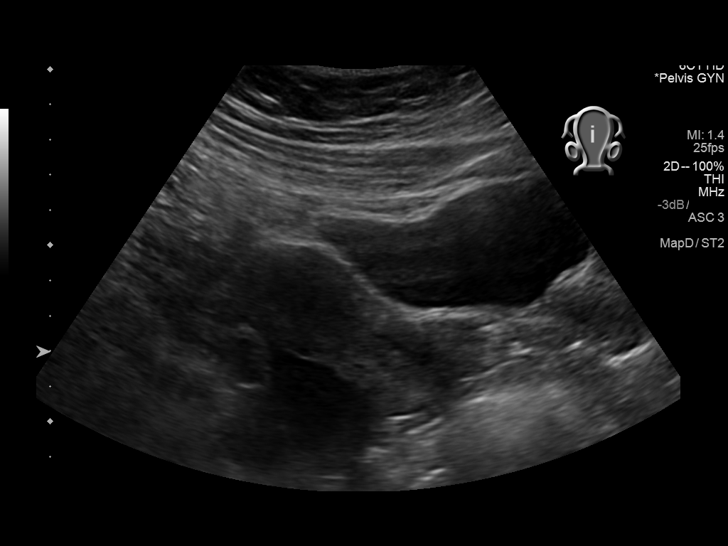
[im 10/114]
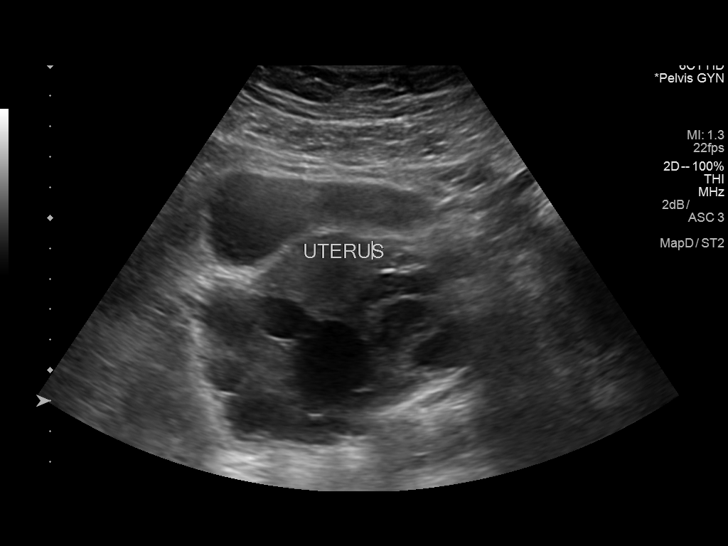
[im 19/114]
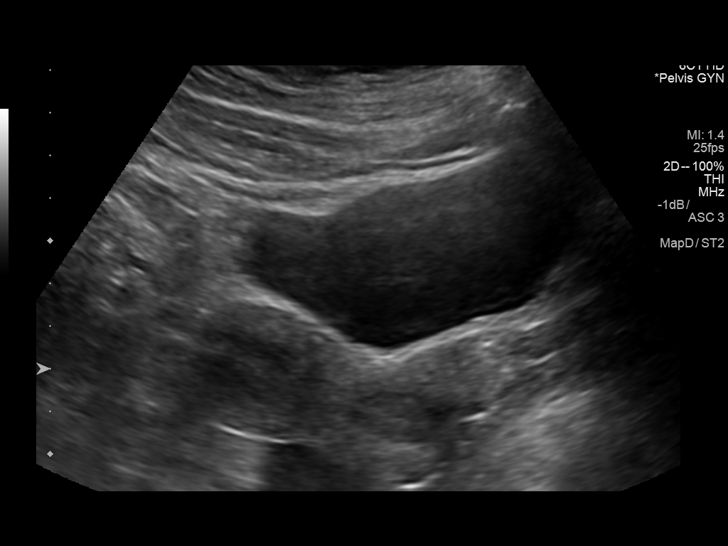
[im 29/114]
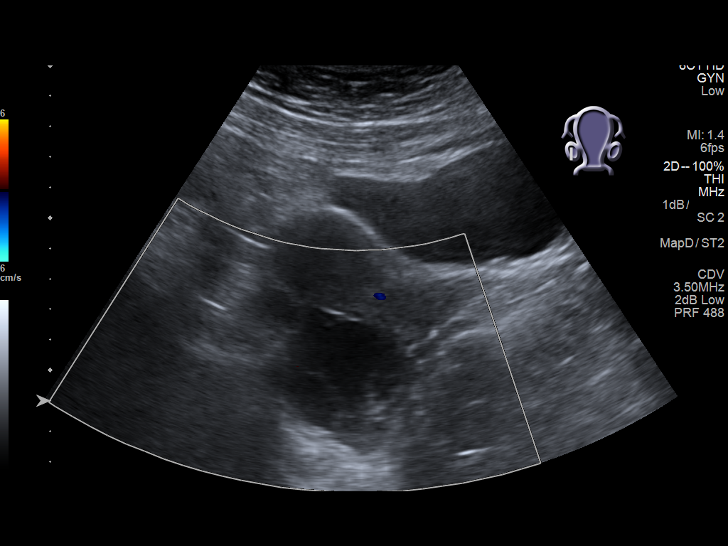
[im 38/114]
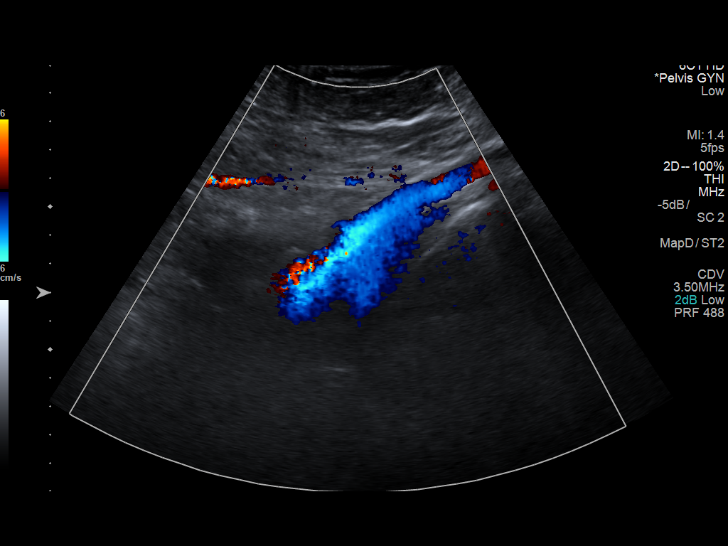
[im 48/114]
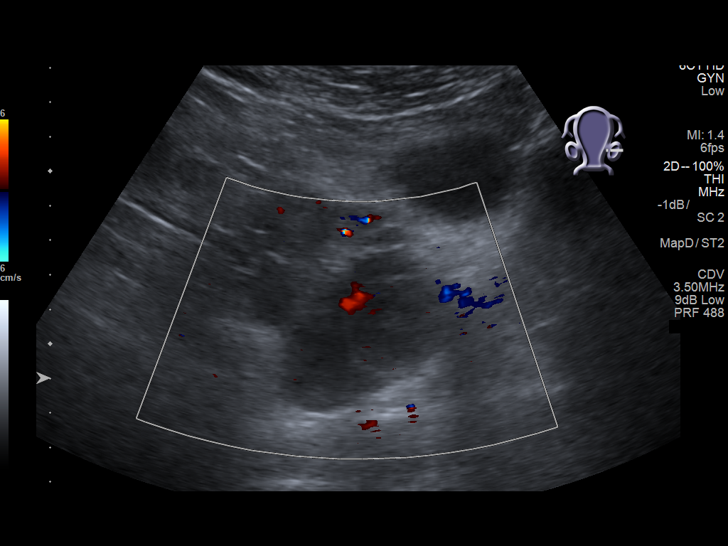
[im 57/114]
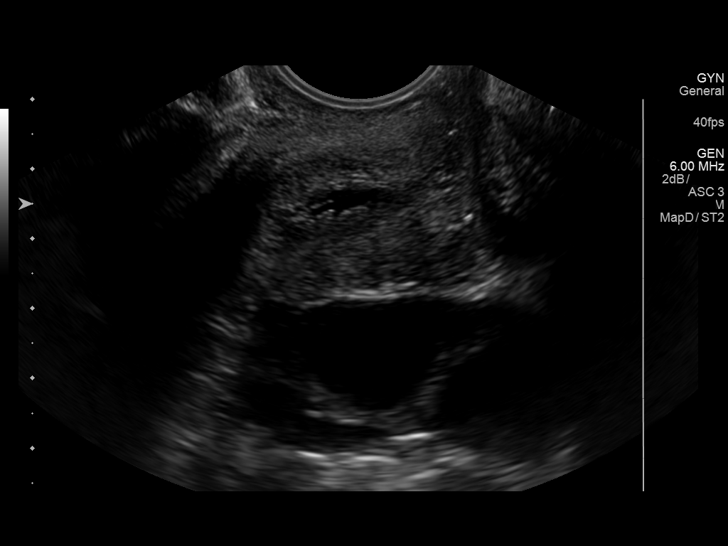
[im 66/114]
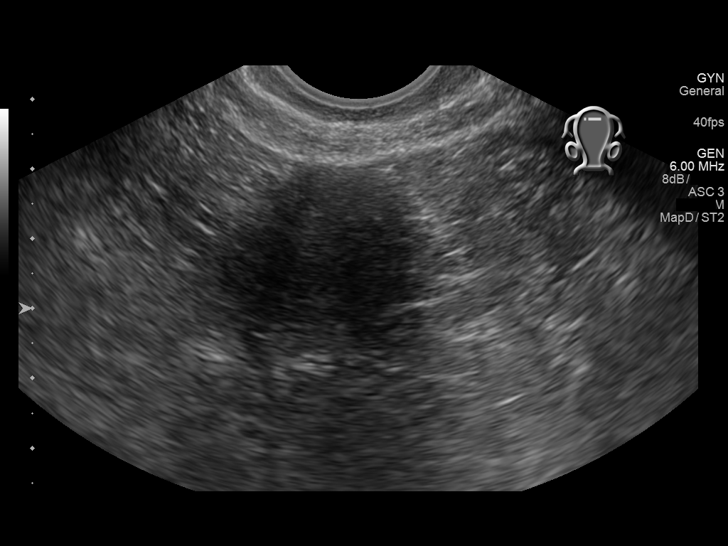
[im 76/114]
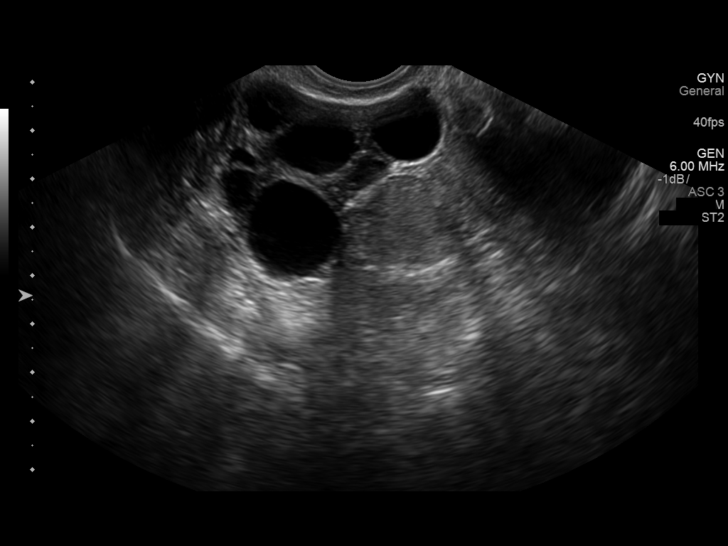
[im 85/114]
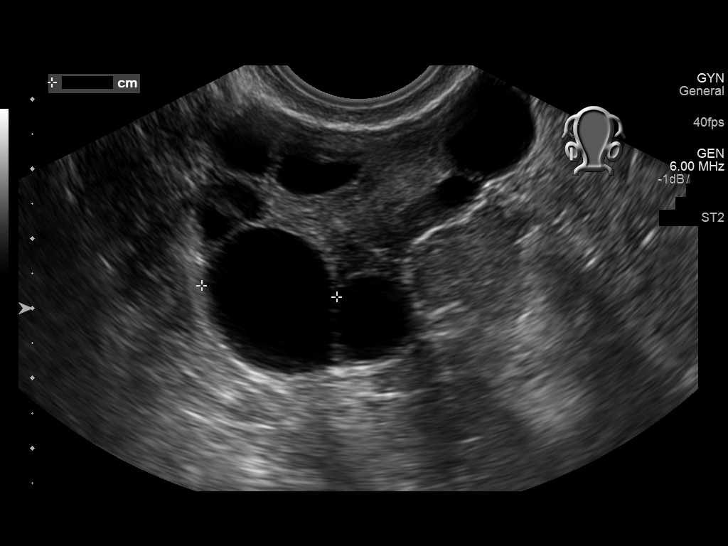
[im 95/114]
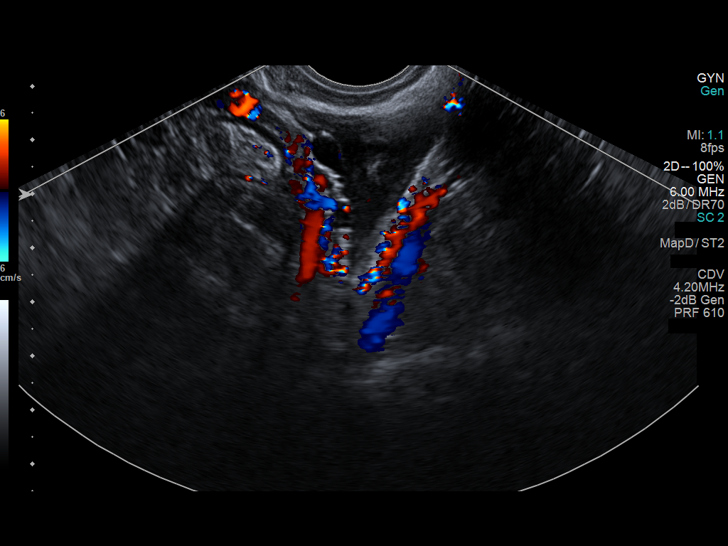
[im 104/114]
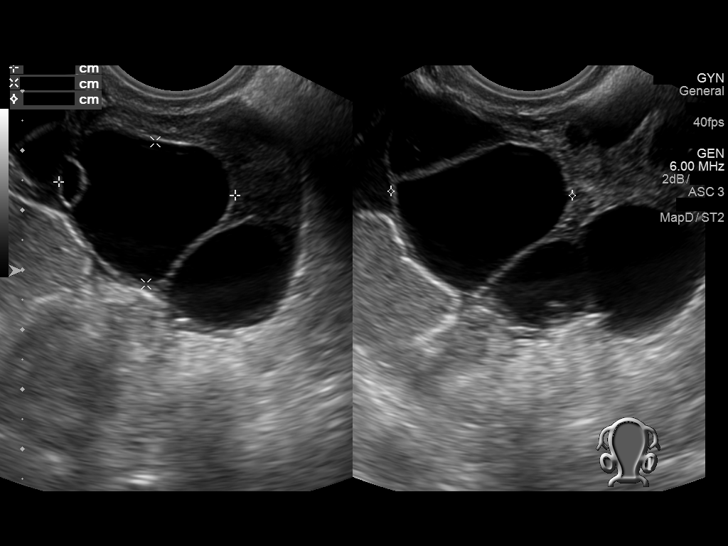
[im 114/114]
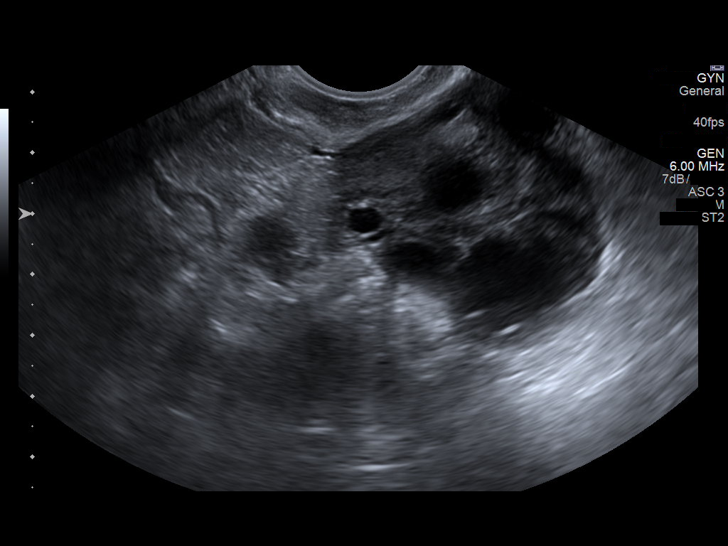

[13 of 25 positions shown; findings below may reference images not displayed]

FINDINGS: Uterus

Measurements: 6.5 x 2.7 x 3.7 cm. No fibroids or other mass
visualized.

Endometrium

Thickness: 5 mm.  No focal abnormality visualized.

Right ovary

Measurements: 5.1 x 4.1 x 4.9 cm. Multiple simple appearing cysts
are seen throughout the right ovary, largest measuring 2.2 cm. Thin
septations are seen between the cysts, and no thickened septations
or solid nodules identified. Blood flow is seen within right ovarian
tissue on color Doppler ultrasound.

Left ovary

Measurements: 6.3 x 4.4 x 4.4 cm. Multiple simple appearing cysts
are seen throughout the left ovary, largest measuring 3 cm. Thin
septations are seen between the cysts, no thickened septations or
solid mural nodules are identified. Blood flow is seen within left
ovarian tissue on color Doppler ultrasound .

Other findings

No abnormal free fluid.
IMPRESSION: Both ovaries are enlarged with multiple cysts measuring 2-3 cm,
separated by thin septations. Differential diagnosis includes
polycystic ovarian syndrome, theca lutein cysts, ovarian
hyperstimulation, or less likely ovarian mucinous neoplasms.
Consider correlation with serum hormone levels and tumor markers,
and followup by ultrasound in 8-12 weeks.

## 2017-08-21 ENCOUNTER — Encounter: Payer: Self-pay | Admitting: Family Medicine

## 2017-08-23 ENCOUNTER — Other Ambulatory Visit: Payer: Self-pay | Admitting: Surgical

## 2017-08-23 DIAGNOSIS — R05 Cough: Secondary | ICD-10-CM

## 2017-08-23 DIAGNOSIS — R059 Cough, unspecified: Secondary | ICD-10-CM

## 2017-08-23 MED ORDER — SPACER/AERO CHAMBER MOUTHPIECE MISC: 0 refills | Status: DC

## 2017-08-23 MED ORDER — ALBUTEROL SULFATE HFA 108 (90 BASE) MCG/ACT IN AERS: 1.0000 | INHALATION_SPRAY | Freq: Three times a day (TID) | RESPIRATORY_TRACT | 0 refills | Status: DC

## 2017-09-02 ENCOUNTER — Other Ambulatory Visit: Payer: Self-pay | Admitting: Family Medicine

## 2017-09-02 DIAGNOSIS — G43009 Migraine without aura, not intractable, without status migrainosus: Secondary | ICD-10-CM

## 2017-10-16 ENCOUNTER — Encounter: Payer: Self-pay | Admitting: Endocrinology

## 2017-10-16 DIAGNOSIS — N83291 Other ovarian cyst, right side: Secondary | ICD-10-CM | POA: Diagnosis not present

## 2017-10-16 DIAGNOSIS — N83292 Other ovarian cyst, left side: Secondary | ICD-10-CM | POA: Diagnosis not present

## 2017-10-16 DIAGNOSIS — E236 Other disorders of pituitary gland: Secondary | ICD-10-CM | POA: Diagnosis not present

## 2017-10-21 ENCOUNTER — Other Ambulatory Visit: Payer: BLUE CROSS/BLUE SHIELD

## 2017-10-23 NOTE — Progress Notes (Signed)
Patient ID: [name], female   DOB: Nov 25, 1991, 26 y.o.   MRN: [id number]             Reason for Appointment:  Hypothyroidism, follow-up visit  Referring physician: Dr. Birdie Sons   History of Present Illness:   Hypothyroidism was first diagnosed in 9/17  Patient has had symptoms of fatigue for about 4 years.  This had been fairly consistent and not significantly more recently She also has had an overall weight gain of about 40 pounds although this has been variable and at one point about 2 years ago had lost about 20 pounds with diet and exercise She has had some tendency to hair loss over the last year along with difficulty concentrating and memory Does have mild cold sensitivity but more in her feet  In 9/17 her gynecologist checked her TSH and it was about 6.  She has been on levothyroxine daily since 11/17, initially 25 g Initially with starting the supplement she felt a little better with energy level and also has had more motivation.  Has been able to focus a little better No further hair loss, also does not think she is feeling cold as before  When TSH was high normal  her dose was increased to 37.5 g in February   Recently she thinks she feels fairly good with her energy level Her weight is improving She is generally feeling better when she is eating healthy and she thinks her other physical symptoms get worse when she goes off her diet  Patient's weight history is as follows:  Wt Readings from Last 3 Encounters:  10/24/17 186 lb 6.4 oz (84.6 kg)  05/07/17 190 lb 12.8 oz (86.5 kg)  04/25/17 195 lb (88.5 kg)    Thyroid function results have been as follows:  Labs from gynecologist done on 10/16/17 showed TSH 2.5, this was reviewed on patient's online record  TSH at baseline done in 9/17 from outside lab was 6  Lab Results  Component Value Date   TSH 2.64 04/23/2017   TSH 4.05 02/12/2017   TSH 6.330 (H) 11/21/2016   FREET4 0.61 04/23/2017   FREET4 0.70  02/12/2017   FREET4 0.87 11/21/2016      Past Medical History:  Diagnosis Date  . History of chicken pox   . Hx of migraine headaches   . SVD (spontaneous vaginal delivery) 05/09/2012  . Thyroid disease   . Urinary tract infection   . Vertigo     Past Surgical History:  Procedure Laterality Date  . TONSILLECTOMY     adenoidectomy  . WISDOM TOOTH EXTRACTION      Family History  Problem Relation Age of Onset  . Diabetes Maternal Grandmother   . Breast cancer Maternal Grandmother   . Parkinson's disease Maternal Grandmother   . Thyroid disease Father   . Kidney disease Paternal Grandmother   . Thyroid disease Paternal Grandmother   . Healthy Brother   . Healthy Daughter   . Anesthesia problems Neg Hx     Social History:  reports that she has never smoked. She has never used smokeless tobacco. She reports that she does not drink alcohol or use drugs.  Allergies:  Allergies  Allergen Reactions  . Erythromycin   . Penicillins Hives    Allergies as of 10/24/2017      Reactions   Erythromycin    Penicillins Hives      Medication List       Accurate as of 10/24/17  8:32 AM. Always use your most recent med list.          albuterol 108 (90 Base) MCG/ACT inhaler Commonly known as:  PROVENTIL HFA;VENTOLIN HFA Inhale 1-2 puffs into the lungs 3 (three) times daily.   levothyroxine 25 MCG tablet Commonly known as:  SYNTHROID, LEVOTHROID TAKE 1.5 TABLETS DAILY   Spacer/Aero Chamber Mouthpiece Misc Use the chamber when using your inhaler.   SPRINTEC 28 0.25-35 MG-MCG tablet Generic drug:  norgestimate-ethinyl estradiol Take 1 tablet by mouth daily.   SRONYX 0.1-20 MG-MCG tablet Generic drug:  levonorgestrel-ethinyl estradiol Take 1 tablet by mouth daily.   topiramate 25 MG tablet Commonly known as:  TOPAMAX TAKE 1 TABLET BY MOUTH TWICE A DAY        Review of Systems   She has polycystic ovaries And reportedly high LH levels, currently treated by  both control pills and having cycles Is seeing a fertility specialist and no records available             Examination:    BP 118/78   Pulse 78   Ht 5\' 6"  (1.676 m)   Wt 186 lb 6.4 oz (84.6 kg)   SpO2 98%   BMI 30.09 kg/m    THYROID: Not palpable  Reflexes are bilaterally  normal at biceps   Skin appears normal   Assessment:  HYPOTHYROIDISM with baseline TSH of 6  Overall since her levothyroxine supplementation has been started she has felt less fatigue Had a small baseline goiter which is not palpable subsequently  With taking 37.5 mg levothyroxine her TSH is consistently  normal   She has intermittent fatigue for various reasons and  she also thinks that this is diet related  OLIGOMENORRHEA: ?  PCOS  She is going to have a fertility specialist send records over for Review here    PLAN:   She will continue 37.5 g of levothyroxine  Follow-up in 6 months With repeat labs    [facility] CenterKUMAR,Maurene Hollin 10/24/2017, 8:32 AM    Note: This office note was prepared with Dragon voice recognition system technology. Any transcriptional errors that result from this process are unintentional.

## 2017-10-24 ENCOUNTER — Encounter: Payer: Self-pay | Admitting: Endocrinology

## 2017-10-24 ENCOUNTER — Ambulatory Visit (INDEPENDENT_AMBULATORY_CARE_PROVIDER_SITE_OTHER): Payer: BLUE CROSS/BLUE SHIELD | Admitting: Endocrinology

## 2017-10-24 VITALS — BP 118/78 | HR 78 | Ht 66.0 in | Wt 186.4 lb

## 2017-10-24 DIAGNOSIS — E063 Autoimmune thyroiditis: Secondary | ICD-10-CM | POA: Diagnosis not present

## 2017-11-04 ENCOUNTER — Other Ambulatory Visit: Payer: Self-pay

## 2017-11-04 MED ORDER — LEVOTHYROXINE SODIUM 25 MCG PO TABS: ORAL_TABLET | ORAL | 3 refills | Status: DC

## 2017-11-06 ENCOUNTER — Other Ambulatory Visit: Payer: Self-pay | Admitting: Endocrinology

## 2017-12-02 ENCOUNTER — Telehealth: Payer: Self-pay

## 2017-12-02 NOTE — Telephone Encounter (Signed)
Fax received for refill on  Topiramate 25mg   1 po bid  Last script 09/03/17 #180 no rf Last o/v 05/17/17 no f/u   Last two bp were both 124/78

## 2017-12-02 NOTE — Telephone Encounter (Signed)
Okay refill. 

## 2017-12-03 ENCOUNTER — Other Ambulatory Visit: Payer: Self-pay

## 2017-12-03 DIAGNOSIS — G43009 Migraine without aura, not intractable, without status migrainosus: Secondary | ICD-10-CM

## 2017-12-03 MED ORDER — TOPIRAMATE 25 MG PO TABS: 25.0000 mg | ORAL_TABLET | Freq: Two times a day (BID) | ORAL | 0 refills | Status: DC

## 2017-12-03 NOTE — Telephone Encounter (Signed)
Faxed

## 2017-12-05 ENCOUNTER — Other Ambulatory Visit: Payer: Self-pay | Admitting: Family Medicine

## 2017-12-05 DIAGNOSIS — G43009 Migraine without aura, not intractable, without status migrainosus: Secondary | ICD-10-CM

## 2017-12-05 DIAGNOSIS — R102 Pelvic and perineal pain: Secondary | ICD-10-CM | POA: Diagnosis not present

## 2017-12-25 IMAGING — CR DG ABDOMEN 1V
1 series · 1 of 1 positions shown · non-contrast
Comparison: None.

CLINICAL DATA: Worsening left side pelvic pain. Left side multiple
abdomen cysts found on most recent ultrasound from this month.

EXAM:
ABDOMEN - 1 VIEW

[abdomen kub]
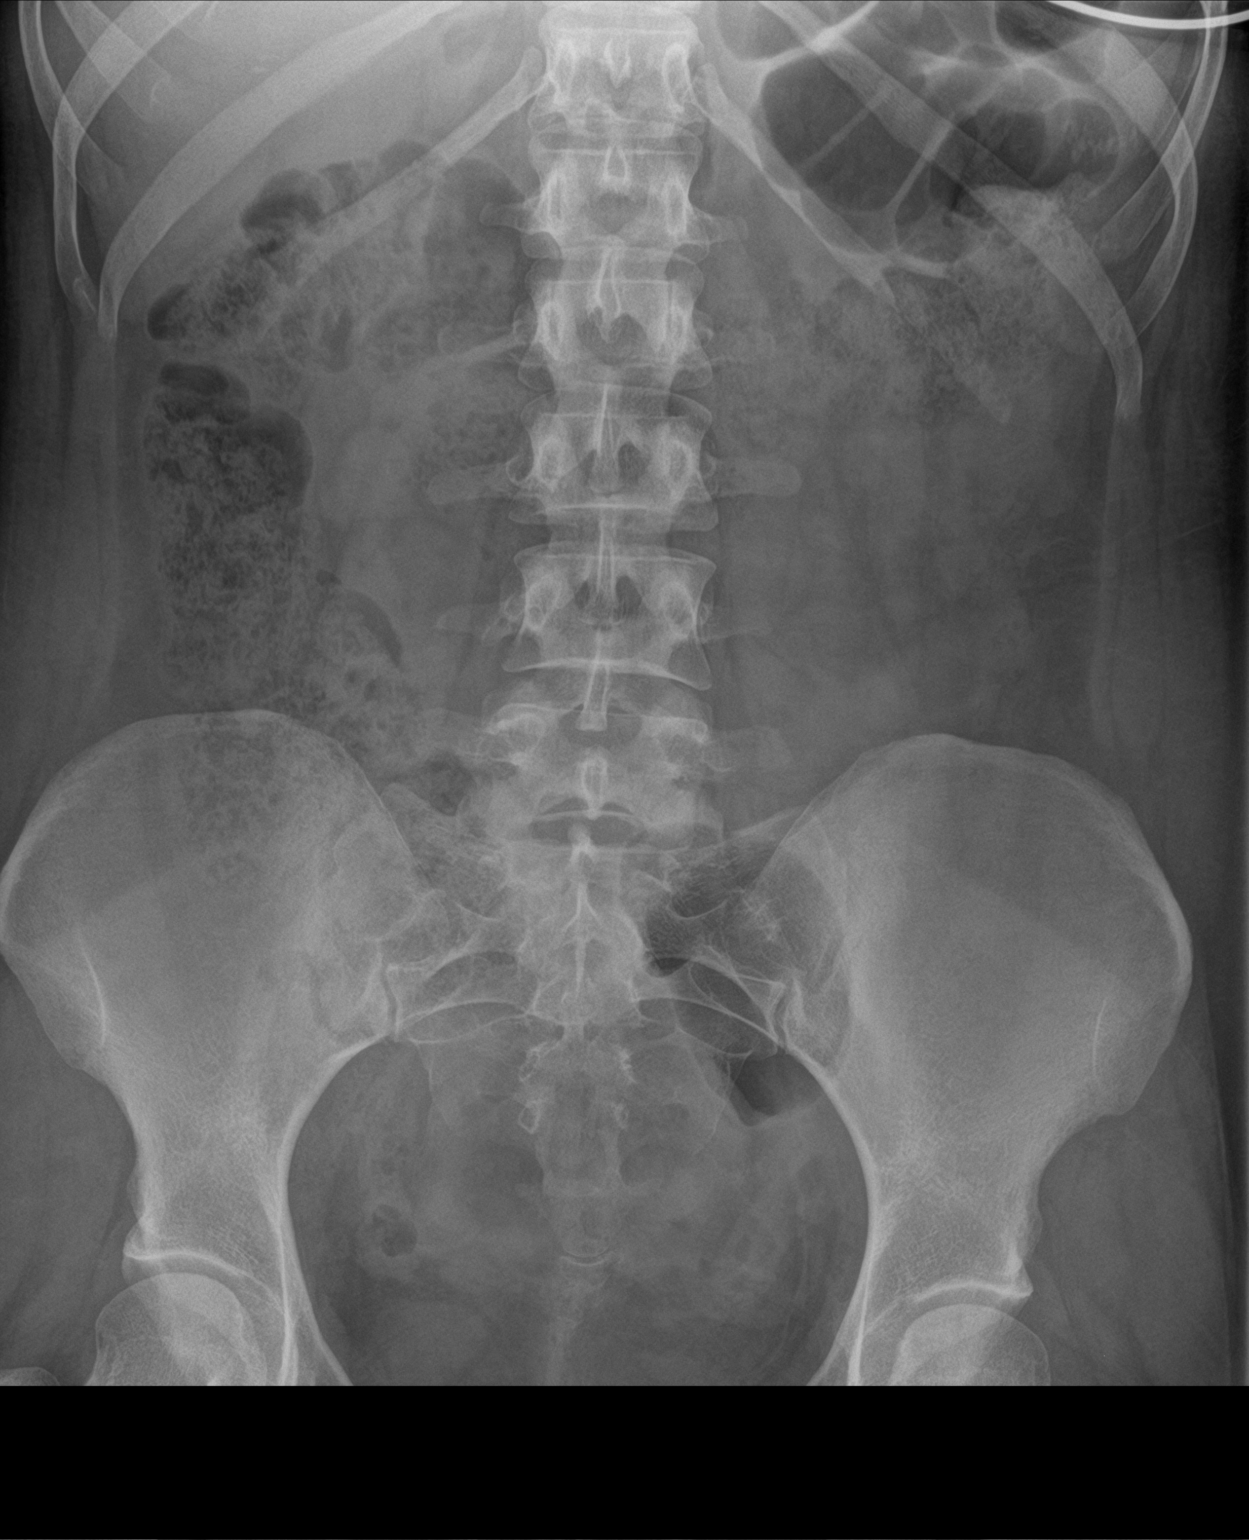

[1 of 1 positions shown; findings below may reference images not displayed]

FINDINGS: Normal bowel gas pattern. Specifically, no evidence of obstruction
or significant generalized adynamic ileus.

Mild colonic stool burden.

No evidence of renal or ureteral stones. Soft tissues are
unremarkable.

Skeletal structures are within normal limits.
IMPRESSION: Negative.

## 2018-02-11 ENCOUNTER — Ambulatory Visit: Payer: BLUE CROSS/BLUE SHIELD | Admitting: Family Medicine

## 2018-02-17 ENCOUNTER — Ambulatory Visit: Payer: BLUE CROSS/BLUE SHIELD | Admitting: Physician Assistant

## 2018-02-17 ENCOUNTER — Ambulatory Visit: Payer: BLUE CROSS/BLUE SHIELD | Admitting: Family Medicine

## 2018-02-17 ENCOUNTER — Encounter: Payer: Self-pay | Admitting: Physician Assistant

## 2018-02-17 VITALS — BP 140/80 | HR 104 | Temp 98.2°F | Ht 66.0 in | Wt 183.5 lb

## 2018-02-17 DIAGNOSIS — J069 Acute upper respiratory infection, unspecified: Secondary | ICD-10-CM

## 2018-02-17 LAB — POCT RAPID STREP A (OFFICE): Rapid Strep A Screen: NEGATIVE

## 2018-02-17 MED ORDER — DOXYCYCLINE HYCLATE 100 MG PO TABS: 100.0000 mg | ORAL_TABLET | Freq: Two times a day (BID) | ORAL | 0 refills | Status: DC

## 2018-02-17 NOTE — Progress Notes (Signed)
[name] is a 27 y.o. female here for a new problem.  I acted as a Neurosurgeon for [employer], PA-C Corky Mull, LPN  History of Present Illness:   Chief Complaint  Patient presents with  . Sore Throat    Sore Throat   This is a new problem. Episode onset: Started Saturday night. The problem has been gradually worsening. Neither side of throat is experiencing more pain than the other. There has been no fever. The pain is at a severity of 5/10. The pain is moderate. Associated symptoms include congestion, coughing, a plugged ear sensation, shortness of breath, swollen glands and trouble swallowing. Pertinent negatives include no diarrhea, ear discharge, ear pain, headaches, hoarse voice, neck pain or vomiting. She has had exposure to strep. Treatments tried: Tylenol cold and flu. The treatment provided no relief.   Appetite is okay. Both her husband and her child have had recent URI. Child had positive strep throat. Dry cough. No SOB. Congested/pressure in sinuses.   Past Medical History:  Diagnosis Date  . History of chicken pox   . Hx of migraine headaches   . SVD (spontaneous vaginal delivery) 05/09/2012  . Thyroid disease   . Urinary tract infection   . Vertigo      Social History   Socioeconomic History  . Marital status: Married    Spouse name: Not on file  . Number of children: Not on file  . Years of education: Not on file  . Highest education level: Not on file  Social Needs  . Financial resource strain: Not on file  . Food insecurity - worry: Not on file  . Food insecurity - inability: Not on file  . Transportation needs - medical: Not on file  . Transportation needs - non-medical: Not on file  Occupational History  . Not on file  Tobacco Use  . Smoking status: Never Smoker  . Smokeless tobacco: Never Used  Substance and Sexual Activity  . Alcohol use: No    Alcohol/week: 0.0 oz  . Drug use: No  . Sexual activity: Yes    Birth control/protection: None   Other Topics Concern  . Not on file  Social History Narrative   Lives with husband and daughter in a one story home.     Works for [employer].     Education: some college.    Past Surgical History:  Procedure Laterality Date  . TONSILLECTOMY     adenoidectomy  . WISDOM TOOTH EXTRACTION      Family History  Problem Relation Age of Onset  . Diabetes Maternal Grandmother   . Breast cancer Maternal Grandmother   . Parkinson's disease Maternal Grandmother   . Thyroid disease Father   . Kidney disease Paternal Grandmother   . Thyroid disease Paternal Grandmother   . Healthy Brother   . Healthy Daughter   . Anesthesia problems Neg Hx     Allergies  Allergen Reactions  . Erythromycin   . Penicillins Hives    Current Medications:   Current Outpatient Medications:  .  levothyroxine (SYNTHROID, LEVOTHROID) 25 MCG tablet, TAKE 1.5 TABLETS DAILY, Disp: 45 tablet, Rfl: 3 .  Spacer/Aero Chamber Mouthpiece MISC, Use the chamber when using your inhaler., Disp: 1 each, Rfl: 0 .  SRONYX 0.1-20 MG-MCG tablet, Take 1 tablet by mouth daily., Disp: , Rfl: 0 .  topiramate (TOPAMAX) 25 MG tablet, Take 1 tablet (25 mg total) by mouth 2 (two) times daily., Disp: 180 tablet, Rfl: 0  Review of Systems:   Review of Systems  HENT: Positive for congestion and trouble swallowing. Negative for ear discharge, ear pain and hoarse voice.   Respiratory: Positive for cough and shortness of breath.   Gastrointestinal: Negative for diarrhea and vomiting.  Musculoskeletal: Negative for neck pain.  Neurological: Negative for headaches.    Vitals:   Vitals:   02/17/18 0813  BP: 140/80  Pulse: (!) 104  Temp: 98.2 F (36.8 C)  TempSrc: Oral  SpO2: 96%  Weight: 183 lb 8 oz (83.2 kg)  Height: 5\' 6"  (1.676 m)     Body mass index is 29.62 kg/m.  Physical Exam:   Physical Exam  Constitutional: She appears well-developed. She is cooperative.  Non-toxic appearance. She does not have a  sickly appearance. She does not appear ill. No distress.  HENT:  Head: Normocephalic and atraumatic.  Right Ear: Tympanic membrane, external ear and ear canal normal. Tympanic membrane is not erythematous, not retracted and not bulging.  Left Ear: Tympanic membrane, external ear and ear canal normal. Tympanic membrane is not erythematous, not retracted and not bulging.  Nose: Mucosal edema and rhinorrhea present. Right sinus exhibits maxillary sinus tenderness. Right sinus exhibits no frontal sinus tenderness. Left sinus exhibits maxillary sinus tenderness. Left sinus exhibits no frontal sinus tenderness.  Mouth/Throat: Uvula is midline and mucous membranes are normal. Posterior oropharyngeal erythema present. No posterior oropharyngeal edema. No tonsillar exudate.  Eyes: Conjunctivae and lids are normal.  Neck: Trachea normal.  Cardiovascular: Normal rate, regular rhythm, S1 normal, S2 normal and normal heart sounds.  Pulmonary/Chest: Effort normal and breath sounds normal. She has no decreased breath sounds. She has no wheezes. She has no rhonchi. She has no rales.  Lymphadenopathy:    She has no cervical adenopathy.  Neurological: She is alert.  Skin: Skin is warm, dry and intact.  Psychiatric: She has a normal mood and affect. Her speech is normal and behavior is normal.  Nursing note and vitals reviewed.   Results for orders placed or performed in visit on 02/17/18  POCT rapid strep A  Result Value Ref Range   Rapid Strep A Screen Negative Negative    Assessment and Plan:    [name] was seen today for sore throat.  Diagnoses and all orders for this visit:  Upper respiratory tract infection, unspecified type   Patient with URI symptoms. Strep test negative. Suspect early acute sinusitis. Will treat with doxycycline per orders, take antibiotics as prescribed. Follow-up if symptoms worsen or persist despite treatment. Patient agreeable to plan.  . Reviewed expectations re: course  of current medical issues. . Discussed self-management of symptoms. . Outlined signs and symptoms indicating need for more acute intervention. . Patient verbalized understanding and all questions were answered. . See orders for this visit as documented in the electronic medical record. . Patient received an After-Visit Summary.  CMA or LPN served as scribe during this visit. History, Physical, and Plan performed by medical provider. Documentation and orders reviewed and attested to.  Jarold MottoSamantha Henri Guedes, PA-C

## 2018-02-17 NOTE — Patient Instructions (Signed)
It was great to see you!  Please start the antibiotic and take as prescribed.  If symptoms worsen or persist despite treatment, please follow-up with us.  Consider alternating tylenol and ibuprofen every 4-6 hours to help with sore throat pain and inflammation. Follow package recommendations.

## 2018-03-11 ENCOUNTER — Other Ambulatory Visit: Payer: Self-pay | Admitting: Surgical

## 2018-03-11 DIAGNOSIS — G43009 Migraine without aura, not intractable, without status migrainosus: Secondary | ICD-10-CM

## 2018-03-11 MED ORDER — TOPIRAMATE 25 MG PO TABS: 25.0000 mg | ORAL_TABLET | Freq: Two times a day (BID) | ORAL | 0 refills | Status: DC

## 2018-03-11 NOTE — Telephone Encounter (Signed)
Please advise on refill. Patient has not been seen by you since 04/2017.

## 2018-04-12 ENCOUNTER — Other Ambulatory Visit: Payer: Self-pay | Admitting: Family Medicine

## 2018-04-12 DIAGNOSIS — G43009 Migraine without aura, not intractable, without status migrainosus: Secondary | ICD-10-CM

## 2018-04-22 ENCOUNTER — Other Ambulatory Visit (INDEPENDENT_AMBULATORY_CARE_PROVIDER_SITE_OTHER): Payer: BLUE CROSS/BLUE SHIELD

## 2018-04-22 DIAGNOSIS — E063 Autoimmune thyroiditis: Secondary | ICD-10-CM

## 2018-04-22 LAB — T4, FREE: FREE T4: 0.75 ng/dL (ref 0.60–1.60)

## 2018-04-22 LAB — TSH: TSH: 1.57 u[IU]/mL (ref 0.35–4.50)

## 2018-04-23 NOTE — Progress Notes (Signed)
Patient ID: [name], female   DOB: 02/13/1991, 27 y.o.   MRN: [id number]             Reason for Appointment:  Hypothyroidism, follow-up visit  Referring physician: Dr. Birdie Sons   History of Present Illness:   Hypothyroidism was first diagnosed in 9/17  Patient has had symptoms of fatigue for about 4 years.  This had been fairly consistent and not significantly more recently She also has had an overall weight gain of about 40 pounds although this has been variable and at one point about 2 years ago had lost about 20 pounds with diet and exercise She has had some tendency to hair loss over the last year along with difficulty concentrating and memory Does have mild cold sensitivity but more in her feet  In 9/17 her gynecologist checked her TSH and it was about 6.  She has been on levothyroxine daily since 11/17, initially 25 g Initially with starting the supplement she felt a little better with energy level and also has had more motivation.  Has been able to focus a little better No further hair loss, also does not think she is feeling cold as before  When TSH was high normal  her dose was increased to 37.5 g in February   Recently she thinks she feels fairly good with her energy level Her weight is improving She is generally feeling better when she is eating healthy and she thinks her other physical symptoms get worse when she goes off her diet  Patient's weight history is as follows:  Wt Readings from Last 3 Encounters:  04/24/18 185 lb 12.8 oz (84.3 kg)  02/17/18 183 lb 8 oz (83.2 kg)  10/24/17 186 lb 6.4 oz (84.6 kg)    Thyroid function results have been as follows:  Labs from gynecologist done on 10/16/17 showed TSH 2.5, this was reviewed on patient's online record  TSH at baseline done in 9/17 from outside lab was 6  Lab Results  Component Value Date   TSH 1.57 04/22/2018   TSH 2.64 04/23/2017   TSH 4.05 02/12/2017   FREET4 0.75 04/22/2018   FREET4 0.61  04/23/2017   FREET4 0.70 02/12/2017      Past Medical History:  Diagnosis Date  . History of chicken pox   . Hx of migraine headaches   . SVD (spontaneous vaginal delivery) 05/09/2012  . Thyroid disease   . Urinary tract infection   . Vertigo     Past Surgical History:  Procedure Laterality Date  . TONSILLECTOMY     adenoidectomy  . WISDOM TOOTH EXTRACTION      Family History  Problem Relation Age of Onset  . Diabetes Maternal Grandmother   . Breast cancer Maternal Grandmother   . Parkinson's disease Maternal Grandmother   . Thyroid disease Father   . Kidney disease Paternal Grandmother   . Thyroid disease Paternal Grandmother   . Healthy Brother   . Healthy Daughter   . Anesthesia problems Neg Hx     Social History:  reports that she has never smoked. She has never used smokeless tobacco. She reports that she does not drink alcohol or use drugs.  Allergies:  Allergies  Allergen Reactions  . Erythromycin   . Penicillins Hives    Allergies as of 04/24/2018      Reactions   Erythromycin    Penicillins Hives      Medication List        Accurate as of  04/24/18  8:08 AM. Always use your most recent med list.          levothyroxine 25 MCG tablet Commonly known as:  SYNTHROID, LEVOTHROID TAKE 1.5 TABLETS DAILY   SRONYX 0.1-20 MG-MCG tablet Generic drug:  levonorgestrel-ethinyl estradiol Take 1 tablet by mouth daily.   topiramate 25 MG tablet Commonly known as:  TOPAMAX TAKE 1 TABLET BY MOUTH TWICE A DAY        Review of Systems She has polycystic ovaries And reportedly high LH levels, currently treated by both control pills and having cycles Is seeing a fertility specialist              Examination:    BP 118/86 (BP Location: Right Arm, Patient Position: Sitting, Cuff Size: Normal)   Pulse 63   Ht 5\' 6"  (1.676 m)   Wt 185 lb 12.8 oz (84.3 kg)   SpO2 98%   BMI 29.99 kg/m    THYROID: Just visible and palpable on the right medially, soft,  minimally enlarged  Reflexes are relatively dry, normal at biceps   Skin appears normal   Assessment:  HYPOTHYROIDISM with baseline TSH of 6, symptomatic   She is doing fairly well symptomatically with thyroid supplementation She is regular with her supplement every morning She has taken relatively low doses of thyroid supplementation and given without any change her TSH is relatively better in the normal range   With taking 37.5 mg levothyroxine her TSH is consistently  normal    OLIGOMENORRHEA: Currently on BCP   PLAN:   She will continue 37.5 g of levothyroxine  Follow-up in 6 months on the same dosage but will have her call if she has any recurrence of symptoms Repeat labs on the next visit also    Reather LittlerAjay Margee Trentham 04/24/2018, 8:08 AM    Note: This office note was prepared with Dragon voice recognition system technology. Any transcriptional errors that result from this process are unintentional.

## 2018-04-24 ENCOUNTER — Encounter: Payer: Self-pay | Admitting: Endocrinology

## 2018-04-24 ENCOUNTER — Ambulatory Visit: Payer: BLUE CROSS/BLUE SHIELD | Admitting: Endocrinology

## 2018-04-24 VITALS — BP 118/86 | HR 63 | Ht 66.0 in | Wt 185.8 lb

## 2018-04-24 DIAGNOSIS — E063 Autoimmune thyroiditis: Secondary | ICD-10-CM

## 2018-05-05 DIAGNOSIS — Z130 Encounter for screening for diseases of the blood and blood-forming organs and certain disorders involving the immune mechanism: Secondary | ICD-10-CM | POA: Diagnosis not present

## 2018-05-05 DIAGNOSIS — Z6830 Body mass index (BMI) 30.0-30.9, adult: Secondary | ICD-10-CM | POA: Diagnosis not present

## 2018-05-05 DIAGNOSIS — Z124 Encounter for screening for malignant neoplasm of cervix: Secondary | ICD-10-CM | POA: Diagnosis not present

## 2018-05-05 DIAGNOSIS — N981 Hyperstimulation of ovaries: Secondary | ICD-10-CM | POA: Insufficient documentation

## 2018-05-05 DIAGNOSIS — R319 Hematuria, unspecified: Secondary | ICD-10-CM | POA: Diagnosis not present

## 2018-05-05 DIAGNOSIS — E282 Polycystic ovarian syndrome: Secondary | ICD-10-CM | POA: Insufficient documentation

## 2018-05-05 DIAGNOSIS — Z01419 Encounter for gynecological examination (general) (routine) without abnormal findings: Secondary | ICD-10-CM | POA: Diagnosis not present

## 2018-05-05 DIAGNOSIS — Z1389 Encounter for screening for other disorder: Secondary | ICD-10-CM | POA: Diagnosis not present

## 2018-05-06 DIAGNOSIS — Z124 Encounter for screening for malignant neoplasm of cervix: Secondary | ICD-10-CM | POA: Diagnosis not present

## 2018-05-06 DIAGNOSIS — Z1151 Encounter for screening for human papillomavirus (HPV): Secondary | ICD-10-CM | POA: Diagnosis not present

## 2018-05-08 DIAGNOSIS — N83201 Unspecified ovarian cyst, right side: Secondary | ICD-10-CM | POA: Diagnosis not present

## 2018-05-08 DIAGNOSIS — N83202 Unspecified ovarian cyst, left side: Secondary | ICD-10-CM | POA: Diagnosis not present

## 2018-05-13 DIAGNOSIS — R109 Unspecified abdominal pain: Secondary | ICD-10-CM | POA: Diagnosis not present

## 2018-05-13 DIAGNOSIS — R1084 Generalized abdominal pain: Secondary | ICD-10-CM | POA: Diagnosis not present

## 2018-05-13 DIAGNOSIS — R110 Nausea: Secondary | ICD-10-CM | POA: Diagnosis not present

## 2018-05-13 DIAGNOSIS — G8929 Other chronic pain: Secondary | ICD-10-CM | POA: Diagnosis not present

## 2018-06-07 ENCOUNTER — Other Ambulatory Visit: Payer: Self-pay | Admitting: Endocrinology

## 2018-06-24 ENCOUNTER — Ambulatory Visit: Payer: BLUE CROSS/BLUE SHIELD | Admitting: Physician Assistant

## 2018-06-24 ENCOUNTER — Encounter: Payer: Self-pay | Admitting: Physician Assistant

## 2018-06-24 ENCOUNTER — Other Ambulatory Visit (HOSPITAL_COMMUNITY)
Admission: RE | Admit: 2018-06-24 | Discharge: 2018-06-24 | Disposition: A | Discharge status: Home / Self Care | Payer: BLUE CROSS/BLUE SHIELD | Source: Ambulatory Visit | Attending: Physician Assistant | Admitting: Physician Assistant

## 2018-06-24 ENCOUNTER — Other Ambulatory Visit: Payer: Self-pay | Admitting: Family Medicine

## 2018-06-24 VITALS — BP 120/80 | HR 76 | Temp 97.9°F | Ht 66.0 in | Wt 186.2 lb

## 2018-06-24 DIAGNOSIS — Z79890 Hormone replacement therapy (postmenopausal): Secondary | ICD-10-CM | POA: Diagnosis not present

## 2018-06-24 DIAGNOSIS — E079 Disorder of thyroid, unspecified: Secondary | ICD-10-CM | POA: Diagnosis not present

## 2018-06-24 DIAGNOSIS — R300 Dysuria: Secondary | ICD-10-CM | POA: Diagnosis not present

## 2018-06-24 DIAGNOSIS — N760 Acute vaginitis: Secondary | ICD-10-CM

## 2018-06-24 DIAGNOSIS — Z79899 Other long term (current) drug therapy: Secondary | ICD-10-CM | POA: Insufficient documentation

## 2018-06-24 LAB — POCT URINALYSIS DIPSTICK
Bilirubin, UA: NEGATIVE
GLUCOSE UA: NEGATIVE
Ketones, UA: NEGATIVE
LEUKOCYTES UA: NEGATIVE
Nitrite, UA: NEGATIVE
Protein, UA: NEGATIVE
Urobilinogen, UA: 0.2 E.U./dL
pH, UA: 5.5 (ref 5.0–8.0)

## 2018-06-24 LAB — POCT URINE PREGNANCY: PREG TEST UR: NEGATIVE

## 2018-06-24 MED ORDER — DOXYCYCLINE HYCLATE 100 MG PO TABS: 100.0000 mg | ORAL_TABLET | Freq: Two times a day (BID) | ORAL | 0 refills | Status: AC

## 2018-06-24 MED ORDER — METRONIDAZOLE 500 MG PO TABS: 500.0000 mg | ORAL_TABLET | Freq: Two times a day (BID) | ORAL | 0 refills | Status: AC

## 2018-06-24 MED ORDER — CEFTRIAXONE SODIUM 250 MG IJ SOLR
250.0000 mg | Freq: Once | INTRAMUSCULAR | Status: AC
  Administered 2018-06-24: 250 mg via INTRAMUSCULAR

## 2018-06-24 NOTE — Telephone Encounter (Signed)
Patient called and told her below message. Patient states she could not do 4:40 today. I offered her tomorrow with a different provider but states she only could do after 4:00pm. There is no appts after 4:00 for tomorrow. Patient states she will go to an urgent care tomorrow after work

## 2018-06-24 NOTE — Telephone Encounter (Signed)
See note

## 2018-06-24 NOTE — Telephone Encounter (Signed)
Left message for patient to call back to schedule an appointment. If she calls back today we can see her at 4:40.

## 2018-06-24 NOTE — Telephone Encounter (Signed)
Copied from CRM [id number]. Topic: Quick Communication - Rx Refill/Question >> Jun 24, 2018  9:11 AM Crist InfanteHarrald, Kathy J wrote: Medication: med for a uti Pt states she did a home uti test and it came back positive. Pt has frequency, back pain. Abdominal cramping. Pt does not get off work until 5:30. Pt is requesting Dr Earlene PlaterWallace call in a med for this. Pt states she thinks Dr Earlene PlaterWallace has done in the past; Pt advised to make an appt,as she may need to be seen,  but pt cannot come in until 4 pm Wed.  Please advise   [id number]- [place] - [address] [phone] (Phone) [phone] (Fax)

## 2018-06-24 NOTE — Telephone Encounter (Signed)
Patient is seeing Lelon MastSamantha today.

## 2018-06-24 NOTE — Patient Instructions (Signed)
Start the two oral antibiotics.  Do not drink any alcohol while on these medications.  If symptoms worsen, please go to the ER.  If symptoms do not improve, call our office -- we may need to order a CT scan.

## 2018-06-24 NOTE — Progress Notes (Signed)
Duanne MoronSarah E Lust is a 27 y.o. female here for a new problem.  I acted as a Neurosurgeonscribe for OfficeMax IncorporatedSamantha Kyreese Chio, PA-C Corky Mullonna Orphanos, LPN  History of Present Illness:   Chief Complaint  Patient presents with  . Dysuria    Dysuria   This is a new problem. Episode onset: Started a  week ago. The problem occurs every urination. The problem has been gradually worsening. The quality of the pain is described as aching and burning. The pain is at a severity of 10/10. The pain is severe. There has been no fever. She is sexually active. There is a history of pyelonephritis (x 1 as a child). Associated symptoms include chills, flank pain (Bilateral), frequency, nausea and urgency. Pertinent negatives include no discharge, hematuria, hesitancy, possible pregnancy or vomiting. She has tried increased fluids for the symptoms. The treatment provided no relief. Her past medical history is significant for recurrent UTIs. There is no history of kidney stones.   Colonoscopy upcoming at beginning of July for ongoing abdominal pain, she thinks that she might have IBS.  [place]. The Tuesday after that, was doubled over, cramping in pain. Lower abdomen is hurting "all the time." Has pressure and discomfort when urinating. She took an at home UTI test and it was positive for leuks. She endorses recent white, thick vaginal discharge.  No LMP recorded. (Menstrual status: Oral contraceptives).    Past Medical History:  Diagnosis Date  . History of chicken pox   . Hx of migraine headaches   . SVD (spontaneous vaginal delivery) 05/09/2012  . Thyroid disease   . Urinary tract infection   . Vertigo      Social History   Socioeconomic History  . Marital status: Married    Spouse name: Not on file  . Number of children: Not on file  . Years of education: Not on file  . Highest education level: Not on file  Occupational History  . Not on file  Social Needs  . Financial resource  strain: Not on file  . Food insecurity:    Worry: Not on file    Inability: Not on file  . Transportation needs:    Medical: Not on file    Non-medical: Not on file  Tobacco Use  . Smoking status: Never Smoker  . Smokeless tobacco: Never Used  Substance and Sexual Activity  . Alcohol use: No    Alcohol/week: 0.0 oz  . Drug use: No  . Sexual activity: Yes    Birth control/protection: None  Lifestyle  . Physical activity:    Days per week: Not on file    Minutes per session: Not on file  . Stress: Not on file  Relationships  . Social connections:    Talks on phone: Not on file    Gets together: Not on file    Attends religious service: Not on file    Active member of club or organization: Not on file    Attends meetings of clubs or organizations: Not on file    Relationship status: Not on file  . Intimate partner violence:    Fear of current or ex partner: Not on file    Emotionally abused: Not on file    Physically abused: Not on file    Forced sexual activity: Not on file  Other Topics Concern  . Not on file  Social History Narrative   Lives with husband and daughter in a one story  home.     Works for [employer].     Education: some college.    Past Surgical History:  Procedure Laterality Date  . TONSILLECTOMY     adenoidectomy  . WISDOM TOOTH EXTRACTION      Family History  Problem Relation Age of Onset  . Diabetes Maternal Grandmother   . Breast cancer Maternal Grandmother   . Parkinson's disease Maternal Grandmother   . Thyroid disease Father   . Kidney disease Paternal Grandmother   . Thyroid disease Paternal Grandmother   . Healthy Brother   . Healthy Daughter   . Anesthesia problems Neg Hx     Allergies  Allergen Reactions  . Erythromycin   . Penicillins Hives    Can tolerate cephalosporins    Current Medications:   Current Outpatient Medications:  .  levothyroxine (SYNTHROID, LEVOTHROID) 25 MCG tablet, TAKE ONE AND HALF TABLET  EVERY MORNING, Disp: 45 tablet, Rfl: 2 .  SRONYX 0.1-20 MG-MCG tablet, Take 1 tablet by mouth daily., Disp: , Rfl: 0 .  topiramate (TOPAMAX) 25 MG tablet, TAKE 1 TABLET BY MOUTH TWICE A DAY, Disp: 180 tablet, Rfl: 0 .  doxycycline (VIBRA-TABS) 100 MG tablet, Take 1 tablet (100 mg total) by mouth 2 (two) times daily for 14 days., Disp: 28 tablet, Rfl: 0 .  metroNIDAZOLE (FLAGYL) 500 MG tablet, Take 1 tablet (500 mg total) by mouth 2 (two) times daily for 14 days., Disp: 28 tablet, Rfl: 0 .  promethazine (PHENERGAN) 12.5 MG tablet, Take 12.5 mg by mouth See admin instructions., Disp: , Rfl: 0   Review of Systems:   Review of Systems  Constitutional: Positive for chills.  Gastrointestinal: Positive for nausea. Negative for vomiting.  Genitourinary: Positive for dysuria, flank pain (Bilateral), frequency and urgency. Negative for hematuria and hesitancy.    Vitals:   Vitals:   06/24/18 1438  BP: 120/80  Pulse: 76  Temp: 97.9 F (36.6 C)  TempSrc: Oral  SpO2: 97%  Weight: 186 lb 4 oz (84.5 kg)  Height: 5\' 6"  (1.676 m)     Body mass index is 30.06 kg/m.  Physical Exam:   Physical Exam  Constitutional: She appears well-developed. She is cooperative.  Non-toxic appearance. She does not have a sickly appearance. She does not appear ill. No distress.  Cardiovascular: Normal rate, regular rhythm, S1 normal, S2 normal, normal heart sounds and normal pulses.  No LE edema  Pulmonary/Chest: Effort normal and breath sounds normal.  Abdominal: Normal appearance and bowel sounds are normal. There is tenderness in the suprapubic area. There is no rigidity, no rebound, no guarding, no CVA tenderness, no tenderness at McBurney's point and negative Murphy's sign.  Genitourinary: Vagina normal and uterus normal. Cervix exhibits motion tenderness and discharge. Right adnexum displays tenderness. Left adnexum displays tenderness.  Neurological: She is alert. GCS eye subscore is 4. GCS verbal subscore  is 5. GCS motor subscore is 6.  Skin: Skin is warm, dry and intact.  Psychiatric: She has a normal mood and affect. Her speech is normal and behavior is normal.  Nursing note and vitals reviewed.  Results for orders placed or performed in visit on 06/24/18  POCT urinalysis dipstick  Result Value Ref Range   Color, UA Yellow    Clarity, UA Clear    Glucose, UA Negative Negative   Bilirubin, UA Negative    Ketones, UA Negative    Spec Grav, UA >=1.030 (A) 1.010 - 1.025   Blood, UA Small (1+)  pH, UA 5.5 5.0 - 8.0   Protein, UA Negative Negative   Urobilinogen, UA 0.2 0.2 or 1.0 E.U./dL   Nitrite, UA Negative    Leukocytes, UA Negative Negative   Appearance     Odor    POCT urine pregnancy  Result Value Ref Range   Preg Test, Ur Negative Negative     Assessment and Plan:    Minka was seen today for dysuria.  Diagnoses and all orders for this visit:  Dysuria and Acute vaginitis Urine without evidence of infection, does have blood. Urine preg negative. Pelvic exam with moderate cervical motion tenderness and discharge. Will treat empirically for PID. Patient reports that she has tolerated cephalosporins in the past (PCN allergy in chart) -- she received 250 mg injection of rocephin in office today and had no adverse events while waiting 15 min after receiving injection. Will also treat with flagyl and doxy. She agreed to STI testing. Will also obtain urine culture. If worsening symptoms, I recommended that she go to the ER. If no improvement of symptoms, consider CT abd/pelvis. Patient was agreeable to plan. -     POCT urinalysis dipstick -     POCT urine pregnancy -     Cervicovaginal ancillary only -     CBC -     Comprehensive metabolic panel -     Urine Culture -     cefTRIAXone (ROCEPHIN) injection 250 mg  Other orders -     doxycycline (VIBRA-TABS) 100 MG tablet; Take 1 tablet (100 mg total) by mouth 2 (two) times daily for 14 days. -     metroNIDAZOLE (FLAGYL) 500  MG tablet; Take 1 tablet (500 mg total) by mouth 2 (two) times daily for 14 days.    . Reviewed expectations re: course of current medical issues. . Discussed self-management of symptoms. . Outlined signs and symptoms indicating need for more acute intervention. . Patient verbalized understanding and all questions were answered. . See orders for this visit as documented in the electronic medical record. . Patient received an After-Visit Summary.  CMA or LPN served as scribe during this visit. History, Physical, and Plan performed by medical provider. Documentation and orders reviewed and attested to.   Jarold Motto, PA-C

## 2018-06-25 LAB — COMPREHENSIVE METABOLIC PANEL
ALT: 21 U/L (ref 0–35)
AST: 19 U/L (ref 0–37)
Albumin: 4.3 g/dL (ref 3.5–5.2)
Alkaline Phosphatase: 53 U/L (ref 39–117)
BILIRUBIN TOTAL: 0.3 mg/dL (ref 0.2–1.2)
BUN: 13 mg/dL (ref 6–23)
CHLORIDE: 103 meq/L (ref 96–112)
CO2: 25 meq/L (ref 19–32)
CREATININE: 0.9 mg/dL (ref 0.40–1.20)
Calcium: 9.3 mg/dL (ref 8.4–10.5)
GFR: 78.54 mL/min (ref >60.00)
Glucose, Bld: 66 mg/dL — ABNORMAL LOW (ref 70–99)
Potassium: 3.5 mEq/L (ref 3.5–5.1)
SODIUM: 138 meq/L (ref 135–145)
Total Protein: 7.6 g/dL (ref 6.0–8.3)

## 2018-06-25 LAB — CBC
HCT: 40.2 % (ref 36.0–46.0)
Hemoglobin: 13.9 g/dL (ref 12.0–15.0)
MCHC: 34.6 g/dL (ref 30.0–36.0)
MCV: 88.7 fl (ref 78.0–100.0)
Platelets: 306 10*3/uL (ref 150.0–400.0)
RBC: 4.53 Mil/uL (ref 3.87–5.11)
RDW: 12.2 % (ref 11.5–15.5)
WBC: 10.8 10*3/uL — ABNORMAL HIGH (ref 4.0–10.5)

## 2018-06-25 LAB — URINE CULTURE
MICRO NUMBER:: 90757640
SPECIMEN QUALITY:: ADEQUATE

## 2018-06-25 LAB — CERVICOVAGINAL ANCILLARY ONLY
BACTERIAL VAGINITIS: POSITIVE — AB
CANDIDA VAGINITIS: NEGATIVE
Chlamydia: NEGATIVE
NEISSERIA GONORRHEA: NEGATIVE
TRICH (WINDOWPATH): NEGATIVE

## 2018-06-26 ENCOUNTER — Encounter (HOSPITAL_COMMUNITY): Payer: Self-pay | Admitting: Radiology

## 2018-06-26 ENCOUNTER — Other Ambulatory Visit: Payer: Self-pay

## 2018-06-26 ENCOUNTER — Ambulatory Visit (HOSPITAL_COMMUNITY)
Admission: RE | Admit: 2018-06-26 | Discharge: 2018-06-26 | Disposition: A | Discharge status: Home / Self Care | Payer: BLUE CROSS/BLUE SHIELD | Source: Ambulatory Visit | Attending: Family Medicine | Admitting: Family Medicine

## 2018-06-26 DIAGNOSIS — N289 Disorder of kidney and ureter, unspecified: Secondary | ICD-10-CM | POA: Insufficient documentation

## 2018-06-26 DIAGNOSIS — R109 Unspecified abdominal pain: Secondary | ICD-10-CM | POA: Insufficient documentation

## 2018-06-26 MED ORDER — IOPAMIDOL (ISOVUE-300) INJECTION 61%
30.0000 mL | Freq: Once | INTRAVENOUS | Status: AC | PRN
  Administered 2018-06-26: 30 mL via ORAL

## 2018-06-26 MED ORDER — IOPAMIDOL (ISOVUE-300) INJECTION 61%
INTRAVENOUS | Status: AC
  Filled 2018-06-26: qty 100

## 2018-06-26 MED ORDER — IOPAMIDOL (ISOVUE-300) INJECTION 61%
INTRAVENOUS | Status: AC
  Filled 2018-06-26: qty 30

## 2018-06-26 MED ORDER — IOPAMIDOL (ISOVUE-300) INJECTION 61%
100.0000 mL | Freq: Once | INTRAVENOUS | Status: AC | PRN
  Administered 2018-06-26: 100 mL via INTRAVENOUS

## 2018-06-26 NOTE — Telephone Encounter (Signed)
Called Patient she states that abdominal pain has not changed but the back pain has gotten much worse. I have reviewed labs with Patient. She has agreed to CT ans would like that ordered. Per our conversation I have instructed patient that it is advised to continue all medications but if the symptoms continue with the nausea she can stop the doxy but stressed the importance of tying to complete  the course as prescribed for maximum effectiveness. Informed patient that we will be calling with CT app date and time. If symptoms get worse or change and she is not able to reach our office she will go to ED

## 2018-06-26 NOTE — Telephone Encounter (Signed)
Patient states she was in on 6/25 and saw KiribatiSamantha. She currently still feels bad with the symptoms along with pretty bad back pain. Feels sick on her stomach/headache. She was just given doxycycline (VIBRA-TABS) 100 MG tablet on 6/25. Patient took that this morning along with metroNIDAZOLE (FLAGYL) 500 MG tablet. The headache started last night/nausea.

## 2018-06-26 NOTE — Telephone Encounter (Signed)
See note

## 2018-06-27 ENCOUNTER — Encounter: Payer: Self-pay | Admitting: Family Medicine

## 2018-06-27 NOTE — Telephone Encounter (Signed)
[name] called and spoke to patient.

## 2018-07-07 DIAGNOSIS — Z01818 Encounter for other preprocedural examination: Secondary | ICD-10-CM | POA: Diagnosis not present

## 2018-07-07 DIAGNOSIS — K529 Noninfective gastroenteritis and colitis, unspecified: Secondary | ICD-10-CM | POA: Diagnosis not present

## 2018-07-16 DIAGNOSIS — K529 Noninfective gastroenteritis and colitis, unspecified: Secondary | ICD-10-CM | POA: Diagnosis not present

## 2018-07-17 DIAGNOSIS — K529 Noninfective gastroenteritis and colitis, unspecified: Secondary | ICD-10-CM | POA: Diagnosis not present

## 2018-07-27 DIAGNOSIS — K580 Irritable bowel syndrome with diarrhea: Secondary | ICD-10-CM | POA: Diagnosis not present

## 2018-07-27 DIAGNOSIS — K529 Noninfective gastroenteritis and colitis, unspecified: Secondary | ICD-10-CM | POA: Diagnosis not present

## 2018-08-24 DIAGNOSIS — R1084 Generalized abdominal pain: Secondary | ICD-10-CM | POA: Diagnosis not present

## 2018-08-24 DIAGNOSIS — K580 Irritable bowel syndrome with diarrhea: Secondary | ICD-10-CM | POA: Diagnosis not present

## 2018-09-12 ENCOUNTER — Other Ambulatory Visit: Payer: Self-pay | Admitting: Family Medicine

## 2018-09-12 DIAGNOSIS — G43009 Migraine without aura, not intractable, without status migrainosus: Secondary | ICD-10-CM

## 2018-10-20 ENCOUNTER — Other Ambulatory Visit: Payer: BLUE CROSS/BLUE SHIELD

## 2018-10-23 ENCOUNTER — Ambulatory Visit: Payer: BLUE CROSS/BLUE SHIELD | Admitting: Endocrinology

## 2018-10-26 ENCOUNTER — Encounter: Payer: Self-pay | Admitting: Family Medicine

## 2018-10-28 ENCOUNTER — Ambulatory Visit: Payer: BLUE CROSS/BLUE SHIELD | Admitting: Endocrinology

## 2018-11-16 ENCOUNTER — Other Ambulatory Visit: Payer: Self-pay | Admitting: Endocrinology

## 2018-12-07 ENCOUNTER — Other Ambulatory Visit: Payer: Self-pay | Admitting: Family Medicine

## 2018-12-07 DIAGNOSIS — G43009 Migraine without aura, not intractable, without status migrainosus: Secondary | ICD-10-CM

## 2018-12-13 ENCOUNTER — Other Ambulatory Visit: Payer: Self-pay | Admitting: Endocrinology

## 2019-01-11 DIAGNOSIS — K529 Noninfective gastroenteritis and colitis, unspecified: Secondary | ICD-10-CM | POA: Diagnosis not present

## 2019-01-11 DIAGNOSIS — R1084 Generalized abdominal pain: Secondary | ICD-10-CM | POA: Diagnosis not present

## 2019-01-11 DIAGNOSIS — K580 Irritable bowel syndrome with diarrhea: Secondary | ICD-10-CM | POA: Diagnosis not present

## 2019-02-14 DIAGNOSIS — J029 Acute pharyngitis, unspecified: Secondary | ICD-10-CM | POA: Diagnosis not present

## 2019-02-24 ENCOUNTER — Ambulatory Visit: Payer: BLUE CROSS/BLUE SHIELD | Admitting: Family Medicine

## 2019-02-24 ENCOUNTER — Encounter: Payer: Self-pay | Admitting: Family Medicine

## 2019-02-24 VITALS — BP 120/88 | HR 87 | Temp 97.9°F | Ht 66.0 in | Wt 187.8 lb

## 2019-02-24 DIAGNOSIS — R102 Pelvic and perineal pain: Secondary | ICD-10-CM

## 2019-02-24 DIAGNOSIS — R5381 Other malaise: Secondary | ICD-10-CM

## 2019-02-24 DIAGNOSIS — E063 Autoimmune thyroiditis: Secondary | ICD-10-CM

## 2019-02-24 DIAGNOSIS — G8929 Other chronic pain: Secondary | ICD-10-CM

## 2019-02-24 DIAGNOSIS — N981 Hyperstimulation of ovaries: Secondary | ICD-10-CM

## 2019-02-24 DIAGNOSIS — N941 Unspecified dyspareunia: Secondary | ICD-10-CM

## 2019-02-24 DIAGNOSIS — G43009 Migraine without aura, not intractable, without status migrainosus: Secondary | ICD-10-CM | POA: Diagnosis not present

## 2019-02-24 DIAGNOSIS — K589 Irritable bowel syndrome without diarrhea: Secondary | ICD-10-CM

## 2019-02-24 DIAGNOSIS — R109 Unspecified abdominal pain: Secondary | ICD-10-CM

## 2019-02-24 DIAGNOSIS — N8110 Cystocele, unspecified: Secondary | ICD-10-CM

## 2019-02-24 DIAGNOSIS — R5383 Other fatigue: Secondary | ICD-10-CM

## 2019-02-24 DIAGNOSIS — R140 Abdominal distension (gaseous): Secondary | ICD-10-CM | POA: Diagnosis not present

## 2019-02-24 DIAGNOSIS — N8189 Other female genital prolapse: Secondary | ICD-10-CM

## 2019-02-25 ENCOUNTER — Other Ambulatory Visit: Payer: Self-pay

## 2019-02-25 ENCOUNTER — Encounter: Payer: Self-pay | Admitting: Family Medicine

## 2019-02-25 DIAGNOSIS — G8929 Other chronic pain: Secondary | ICD-10-CM | POA: Insufficient documentation

## 2019-02-25 DIAGNOSIS — N8189 Other female genital prolapse: Secondary | ICD-10-CM | POA: Insufficient documentation

## 2019-02-25 DIAGNOSIS — A6000 Herpesviral infection of urogenital system, unspecified: Secondary | ICD-10-CM | POA: Insufficient documentation

## 2019-02-25 DIAGNOSIS — K589 Irritable bowel syndrome without diarrhea: Secondary | ICD-10-CM | POA: Insufficient documentation

## 2019-02-25 DIAGNOSIS — N8110 Cystocele, unspecified: Secondary | ICD-10-CM | POA: Insufficient documentation

## 2019-02-25 DIAGNOSIS — R102 Pelvic and perineal pain: Secondary | ICD-10-CM

## 2019-02-25 DIAGNOSIS — N941 Unspecified dyspareunia: Secondary | ICD-10-CM | POA: Insufficient documentation

## 2019-02-25 LAB — CBC WITH DIFFERENTIAL/PLATELET
Basophils Absolute: 0 10*3/uL (ref 0.0–0.1)
Basophils Relative: 0.5 % (ref 0.0–3.0)
Eosinophils Absolute: 0.2 10*3/uL (ref 0.0–0.7)
Eosinophils Relative: 2.4 % (ref 0.0–5.0)
HCT: 39.9 % (ref 36.0–46.0)
Hemoglobin: 13.7 g/dL (ref 12.0–15.0)
Lymphocytes Relative: 33.9 % (ref 12.0–46.0)
Lymphs Abs: 2.2 10*3/uL (ref 0.7–4.0)
MCHC: 34.4 g/dL (ref 30.0–36.0)
MCV: 88.7 fl (ref 78.0–100.0)
Monocytes Absolute: 0.3 10*3/uL (ref 0.1–1.0)
Monocytes Relative: 5.4 % (ref 3.0–12.0)
Neutro Abs: 3.7 10*3/uL (ref 1.4–7.7)
Neutrophils Relative %: 57.8 % (ref 43.0–77.0)
Platelets: 303 10*3/uL (ref 150.0–400.0)
RBC: 4.5 Mil/uL (ref 3.87–5.11)
RDW: 12.4 % (ref 11.5–15.5)
WBC: 6.4 10*3/uL (ref 4.0–10.5)

## 2019-02-25 LAB — SEDIMENTATION RATE: Sed Rate: 16 mm/hr (ref 0–20)

## 2019-02-25 LAB — URINALYSIS, ROUTINE W REFLEX MICROSCOPIC
Bilirubin Urine: NEGATIVE
Hgb urine dipstick: NEGATIVE
Ketones, ur: NEGATIVE
Leukocytes,Ua: NEGATIVE
Nitrite: NEGATIVE
RBC / HPF: NONE SEEN (ref 0–?)
Specific Gravity, Urine: 1.02 (ref 1.000–1.030)
Total Protein, Urine: NEGATIVE
Urine Glucose: NEGATIVE
Urobilinogen, UA: 0.2 (ref 0.0–1.0)
pH: 8 (ref 5.0–8.0)

## 2019-02-25 LAB — COMPREHENSIVE METABOLIC PANEL
ALT: 25 U/L (ref 0–35)
AST: 21 U/L (ref 0–37)
Albumin: 4.2 g/dL (ref 3.5–5.2)
Alkaline Phosphatase: 54 U/L (ref 39–117)
BUN: 14 mg/dL (ref 6–23)
CO2: 26 mEq/L (ref 19–32)
Calcium: 9 mg/dL (ref 8.4–10.5)
Chloride: 104 mEq/L (ref 96–112)
Creatinine, Ser: 1.05 mg/dL (ref 0.40–1.20)
GFR: 61.57 mL/min (ref >60.00)
Glucose, Bld: 77 mg/dL (ref 70–99)
Potassium: 3.7 mEq/L (ref 3.5–5.1)
Sodium: 139 mEq/L (ref 135–145)
Total Bilirubin: 0.2 mg/dL (ref 0.2–1.2)
Total Protein: 6.8 g/dL (ref 6.0–8.3)

## 2019-02-25 LAB — URINE CULTURE
MICRO NUMBER:: 239692
Result:: NO GROWTH
SPECIMEN QUALITY:: ADEQUATE

## 2019-02-25 LAB — C-REACTIVE PROTEIN: CRP: 1.8 mg/dL (ref 0.5–20.0)

## 2019-02-25 NOTE — Assessment & Plan Note (Addendum)
Patient may continue Bentyl.  Differential does include SIBO.  We will see if we can order the lactulose breath test.  We will also get records from GI.

## 2019-02-25 NOTE — Assessment & Plan Note (Signed)
Recheck labs today. 

## 2019-02-25 NOTE — Assessment & Plan Note (Signed)
We will see how quickly we can get her into urogynecology, but may order pelvic ultrasound for current evaluation.  Differential diagnoses of current pelvic pain should include endometriosis.  The patient reports that she was told that it probably was not that.  I suggested a second opinion.

## 2019-02-25 NOTE — Assessment & Plan Note (Signed)
As this is associated with cystocele and chronic dyspareunia as well as pelvic pain, I recommend that she see a uro-gynecologist.  I will go ahead and put that referral in for her she is not interested in future pregnancies and would like to be more aggressive with treatment.

## 2019-02-25 NOTE — Progress Notes (Signed)
[name] is a 28 y.o. female is here for follow up.  History of Present Illness:   HPI: Patient discussed her ongoing pelvic pain as well as fatigue.  She has been seen by gynecology, urology, GI for this issue.  She describes that that can be constant and radiates to her bilateral back.  A pelvic ultrasound did show polycystic ovaries.  The patient reports that the cyst would not be the cause of her discomfort per the gynecology PA.  She was trialed on multiple OCPs.  Her current OCP helps her to feel somewhat normal.  When her pain is more severe, she describes it as burning and cramping.  It is sometimes associated nausea and lightheadedness.  She denies bowel changes but endorses bloating.  She was seen by gastroenterology and had a normal colonoscopy.  She states that her GI doctor noted some evidence of microcolitis and recommended a gluten-free diet.  She has noticed improvement in her symptoms with a gluten-free diet, though she has a difficult time understanding what she can and cannot eat.  She is open to seeing a dietitian at this point.  She was given Bentyl for abdominal cramping and it is mildly helpful.  Patient does have a history of 2 vaginal deliveries and one suspected miscarriage.  She has pelvic floor relaxation and cystocele.  She did see urology and reports that she was told that she was too young to have any surgical intervention.  This is frustrating to her as she does not want anymore children and has significant discomfort with a cystocele.  She did undergo pelvic floor physical therapy without improvement in her symptoms.  She has a history of frequent UTIs.  History of remote trichomonas and HSV.  No history of PID.  No vaginal discharge or dysuria.  She endorses dyspareunia, especially with certain positions.  She states this does induce the pelvic pain that is usual for her.  She also endorses some left sciatica with intercourse when she is lying on her back.  No chronic  back pain otherwise.  These chronic medical issues have caused significant anxiety and led to some depression.  She does not feel that work-ups have gotten to the root of her pain.  She is not interested in medications that can control symptoms.  Another of the patient's main complaint today is also profound fatigue.  She does sleep from 10 PM to 5 AM every day at 5 AM she wakes and is able to exercise.  She says that she is able to have some energy from 5 to 8 AM, but afterwards it drops significantly.  She has no difficulty with falling or staying asleep.  She feels that she can take a nap every day.  No snoring.  No morning headaches.  She is on Topamax but this was an issue prior to that medication.   Health Maintenance Due  Topic Date Due  . PAP-Cervical Cytology Screening  03/24/2010  . PAP SMEAR-Modifier  03/24/2010  . INFLUENZA VACCINE  07/31/2018   Depression screen [facility] 2/9 02/24/2019 03/23/2016  Decreased Interest 1 0  Down, Depressed, Hopeless 2 0  PHQ - 2 Score 3 0  Altered sleeping 3 -  Tired, decreased energy 3 -  Change in appetite 3 -  Feeling bad or failure about yourself  0 -  Trouble concentrating 0 -  Moving slowly or fidgety/restless 0 -  Suicidal thoughts 0 -  PHQ-9 Score 12 -  Difficult doing work/chores  Very difficult -   PMHx, SurgHx, SocialHx, FamHx, Medications, and Allergies were reviewed in the Visit Navigator and updated as appropriate.   Patient Active Problem List   Diagnosis Date Noted  . Female cystocele 02/25/2019  . Pelvic floor relaxation, s/p pelvic PT, without improvement 02/25/2019  . Genital herpes simplex, with rare outbreaks 02/25/2019  . IBS (irritable bowel syndrome) 02/25/2019  . Dyspareunia in female 02/25/2019  . Chronic pelvic pain in female 02/25/2019  . Hyperstimulation of ovaries, with Hx of polycystic ovaries on [name] 05/05/2018  . Hypothyroidism, acquired, autoimmune, on Levothyroxine, followed by Endocrinology 11/25/2016  .  Migraine, controlled with Topamax 11/01/2016   Social History   Tobacco Use  . Smoking status: Never Smoker  . Smokeless tobacco: Never Used  Substance Use Topics  . Alcohol use: No    Alcohol/week: 0.0 standard drinks  . Drug use: No   Current Medications and Allergies   .  dicyclomine (BENTYL) 20 MG tablet, Take 20 mg by mouth every 6 (six) hours., Disp: , Rfl:  .  levothyroxine (SYNTHROID, LEVOTHROID) 25 MCG tablet, TAKE ONE AND HALF TABLET EVERY MORNING, Disp: 45 tablet, Rfl: 0 .  promethazine (PHENERGAN) 12.5 MG tablet, Take 12.5 mg by mouth See admin instructions., Disp: , Rfl: 0 .  SRONYX 0.1-20 MG-MCG tablet, Take 1 tablet by mouth daily., Disp: , Rfl: 0 .  topiramate (TOPAMAX) 25 MG tablet, TAKE 1 TABLET BY MOUTH TWICE A DAY, Disp: 180 tablet, Rfl: 0   Allergies  Allergen Reactions  . Erythromycin   . Penicillins Hives    Can tolerate cephalosporins   Review of Systems   Pertinent items are noted in the HPI. Otherwise, a complete ROS is negative.  Vitals   Vitals:   02/24/19 1522  BP: 120/88  Pulse: 87  Temp: 97.9 F (36.6 C)  TempSrc: Oral  SpO2: 98%  Weight: 187 lb 12.8 oz (85.2 kg)  Height: 5\' 6"  (1.676 m)     Body mass index is 30.31 kg/m.  Physical Exam   Physical Exam Vitals signs and nursing note reviewed.  Constitutional:      General: She is not in acute distress.    Appearance: Normal appearance.  HENT:     Head: Normocephalic and atraumatic.     Nose: Nose normal.     Mouth/Throat:     Mouth: Mucous membranes are moist.  Eyes:     Extraocular Movements: Extraocular movements intact.     Conjunctiva/sclera: Conjunctivae normal.     Pupils: Pupils are equal, round, and reactive to light.  Neck:     Musculoskeletal: Normal range of motion and neck supple.  Cardiovascular:     Rate and Rhythm: Normal rate and regular rhythm.     Heart sounds: Normal heart sounds.  Pulmonary:     Effort: Pulmonary effort is normal.  Abdominal:      Palpations: Abdomen is soft. There is no mass.     Tenderness: There is abdominal tenderness in the right lower quadrant, suprapubic area and left lower quadrant.  Skin:    General: Skin is warm.  Neurological:     Mental Status: She is alert.  Psychiatric:        Behavior: Behavior normal.    Assessment and Plan   Pelvic floor relaxation, s/p pelvic PT, without improvement As this is associated with cystocele and chronic dyspareunia as well as pelvic pain, I recommend that she see a uro-gynecologist.  I will go ahead and put  that referral in for her she is not interested in future pregnancies and would like to be more aggressive with treatment.  Migraine, controlled with Topamax Continue current medication.  We may change this to Trokendi to see if it lessens fatigue at all.  IBS (irritable bowel syndrome) Patient may continue Bentyl.  Differential does include SIBO.  We will see if we can order the lactulose breath test.  We will also get records from GI.  Hypothyroidism, acquired, autoimmune, on Levothyroxine, followed by Endocrinology Recheck labs today.  Hyperstimulation of ovaries, with Hx of polycystic ovaries on [name] We will see how quickly we can get her into urogynecology, but may order pelvic ultrasound for current evaluation.  Differential diagnoses of current pelvic pain should include endometriosis.  The patient reports that she was told that it probably was not that.  I suggested a second opinion.  Female cystocele Will send to urogynecology for reevaluation.  Dyspareunia in female Will send to urogynecology for reevaluation.  Labs pending.  Question endometriosis.  Orders Placed This Encounter  Procedures  . Urine Culture  . CBC with Differential/Platelet  . TSH  . T4, free  . Comprehensive metabolic panel  . B-HCG Quant  . Sedimentation rate  . C-reactive protein  . Urinalysis, Routine w reflex microscopic  . IBC + Ferritin  . Ambulatory referral to  Urogynecology   . Reviewed expectations re: course of current medical issues. . Discussed self-management of symptoms. . Outlined signs and symptoms indicating need for more acute intervention. . Patient verbalized understanding and all questions were answered. Marland Kitchen Health Maintenance issues including appropriate healthy diet, exercise, and smoking avoidance were discussed with patient. . See orders for this visit as documented in the electronic medical record. . Patient received an After Visit Summary.  Helane Rima, DO [place] 02/25/2019  Records requested if needed. Time spent with the patient: 45 minutes, of which >50% was spent in obtaining information about her symptoms, reviewing her previous labs, evaluations, and treatments, counseling her about her condition (please see the discussed topics above), and developing a plan to further investigate it; she had a number of questions which I addressed.

## 2019-02-25 NOTE — Assessment & Plan Note (Signed)
Will send to urogynecology for reevaluation.  Labs pending.  Question endometriosis.

## 2019-02-25 NOTE — Assessment & Plan Note (Signed)
Continue current medication.  We may change this to Trokendi to see if it lessens fatigue at all.

## 2019-02-25 NOTE — Assessment & Plan Note (Signed)
Will send to urogynecology for reevaluation.

## 2019-02-26 LAB — TSH: TSH: 1.35 u[IU]/mL (ref 0.35–4.50)

## 2019-02-26 LAB — IBC + FERRITIN
Ferritin: 30.7 ng/mL (ref 10.0–291.0)
Iron: 57 ug/dL (ref 42–145)
Saturation Ratios: 14.9 % — ABNORMAL LOW (ref 20.0–50.0)
Transferrin: 273 mg/dL (ref 212.0–360.0)

## 2019-02-26 LAB — T4, FREE: Free T4: 0.83 ng/dL (ref 0.60–1.60)

## 2019-02-26 LAB — HCG, QUANTITATIVE, PREGNANCY: Quantitative HCG: <0.6 m[IU]/mL

## 2019-03-05 ENCOUNTER — Ambulatory Visit: Payer: Self-pay | Admitting: Family Medicine

## 2019-03-05 NOTE — Telephone Encounter (Signed)
Pt. Reports she has had some nausea, migraine, sore throat and cough. Started 2 days ago. She also had started her iron. Told pt. That iron can cause nausea when taken on an empty stomach. Encouraged pt. To hold her iron until she feels better. Try to take it with a small amount of food. Verbalizes understanding.  Answer Assessment - Initial Assessment Questions 1. SYMPTOMS: "Do you have any symptoms?"     Nausea, migraine, sore throat 2. SEVERITY: If symptoms are present, ask "Are they mild, moderate or severe?"     Moderate  Protocols used: MEDICATION QUESTION CALL-A-AH

## 2019-03-06 ENCOUNTER — Other Ambulatory Visit: Payer: Self-pay | Admitting: Endocrinology

## 2019-03-06 NOTE — Telephone Encounter (Signed)
Rx sent 

## 2019-03-06 NOTE — Telephone Encounter (Signed)
She can have 15 tablets with no to make an appointment with labs

## 2019-03-06 NOTE — Telephone Encounter (Signed)
Patient has not been seen since 04/24/2018. Refill or deny?

## 2019-03-11 ENCOUNTER — Other Ambulatory Visit: Payer: Self-pay | Admitting: Family Medicine

## 2019-03-11 DIAGNOSIS — G43009 Migraine without aura, not intractable, without status migrainosus: Secondary | ICD-10-CM

## 2019-04-01 DIAGNOSIS — N8111 Cystocele, midline: Secondary | ICD-10-CM | POA: Diagnosis not present

## 2019-04-01 DIAGNOSIS — R350 Frequency of micturition: Secondary | ICD-10-CM | POA: Diagnosis not present

## 2019-04-01 DIAGNOSIS — R351 Nocturia: Secondary | ICD-10-CM | POA: Diagnosis not present

## 2019-04-07 DIAGNOSIS — R351 Nocturia: Secondary | ICD-10-CM | POA: Diagnosis not present

## 2019-04-07 DIAGNOSIS — R350 Frequency of micturition: Secondary | ICD-10-CM | POA: Diagnosis not present

## 2019-04-07 DIAGNOSIS — R3915 Urgency of urination: Secondary | ICD-10-CM | POA: Diagnosis not present

## 2019-04-14 DIAGNOSIS — N8111 Cystocele, midline: Secondary | ICD-10-CM | POA: Diagnosis not present

## 2019-04-14 DIAGNOSIS — R350 Frequency of micturition: Secondary | ICD-10-CM | POA: Diagnosis not present

## 2019-04-15 ENCOUNTER — Encounter: Payer: Self-pay | Admitting: Endocrinology

## 2019-04-15 ENCOUNTER — Ambulatory Visit (INDEPENDENT_AMBULATORY_CARE_PROVIDER_SITE_OTHER): Payer: BLUE CROSS/BLUE SHIELD | Admitting: Endocrinology

## 2019-04-15 ENCOUNTER — Other Ambulatory Visit: Payer: Self-pay

## 2019-04-15 VITALS — Wt 187.0 lb

## 2019-04-15 DIAGNOSIS — E063 Autoimmune thyroiditis: Secondary | ICD-10-CM | POA: Diagnosis not present

## 2019-04-15 MED ORDER — LEVOTHYROXINE SODIUM 25 MCG PO TABS: ORAL_TABLET | ORAL | 1 refills | Status: DC

## 2019-04-15 NOTE — Progress Notes (Signed)
Patient ID: Hannah MoronSarah E Leach, female   DOB: 11/16/1991, 28 y.o.   MRN: [id number]             Reason for Appointment:  Hypothyroidism, follow-up visit   Today's office visit was provided via telemedicine using video technique Explained to the patient and the the limitations of evaluation and management by telemedicine and the availability of in person appointments.  The patient understood the limitations and agreed to proceed. Patient also understood that the telehealth visit is billable. . Location of the patient: Home . Location of the provider: Office Only the patient and myself were participating in the encounter     Referring physician: Dr. Birdie SonsSonnenberg   History of Present Illness:   Hypothyroidism was first diagnosed in 9/17  Patient has had symptoms of fatigue for about 4 years.  This had been fairly consistent and not significantly more recently She also has had an overall weight gain of about 40 pounds although this has been variable and at one point about 2 years ago had lost about 20 pounds with diet and exercise She has had some tendency to hair loss over the last year along with difficulty concentrating and memory Does have mild cold sensitivity but more in her feet  In 9/17 her gynecologist checked her TSH and it was about 6.  RECENT HISTORY: She has been on levothyroxine daily since 11/17, initially 25 g and subsequently higher doses  Initially with starting the levothyroxine she felt a little better with energy level and also had more motivation and better focusing.   When TSH was high normal  her dose was increased to 37.5 g in February 2018  Recently she thinks she feels fairly good with no unusual fatigue although she may tend to get sleepy after 8 PM She did start feeling more tired when she ran out of her medication on Sunday  Her weight is unchanged No complaints of hair loss, cold intolerance She said that every few months she has symptoms of backache,  headache, feeling tired and this gets better on its own in about 7 to 10 days She has been told to take some iron also  She is consistent with taking her levothyroxine around 6 AM daily with water only  Her TSH done in February was normal  Patient's weight history is as follows:  Wt Readings from Last 3 Encounters:  02/24/19 187 lb 12.8 oz (85.2 kg)  06/24/18 186 lb 4 oz (84.5 kg)  04/24/18 185 lb 12.8 oz (84.3 kg)    Thyroid function results have been as follows:  Labs from gynecologist done on 10/16/17 showed TSH 2.5, this was reviewed on patient's online record  TSH at baseline done in 9/17 from outside lab was 6  Lab Results  Component Value Date   TSH 1.35 02/24/2019   TSH 1.57 04/22/2018   TSH 2.64 04/23/2017   FREET4 0.83 02/24/2019   FREET4 0.75 04/22/2018   FREET4 0.61 04/23/2017      Past Medical History:  Diagnosis Date  . History of chicken pox   . SVD (spontaneous vaginal delivery) 05/09/2012  . Urinary tract infection   . Vertigo     Past Surgical History:  Procedure Laterality Date  . TONSILLECTOMY     adenoidectomy  . WISDOM TOOTH EXTRACTION      Family History  Problem Relation Age of Onset  . Diabetes Maternal Grandmother   . Breast cancer Maternal Grandmother   . Parkinson's disease Maternal Grandmother   .  Thyroid disease Father   . Kidney disease Paternal Grandmother   . Thyroid disease Paternal Grandmother   . Healthy Brother   . Healthy Daughter   . Anesthesia problems Neg Hx     Social History:  reports that she has never smoked. She has never used smokeless tobacco. She reports that she does not drink alcohol or use drugs.  Allergies:  Allergies  Allergen Reactions  . Erythromycin   . Penicillins Hives    Can tolerate cephalosporins    Allergies as of 04/15/2019      Reactions   Erythromycin    Penicillins Hives   Can tolerate cephalosporins      Medication List       Accurate as of April 15, 2019  8:04 AM. Always  use your most recent med list.        dicyclomine 20 MG tablet Commonly known as:  BENTYL Take 20 mg by mouth every 6 (six) hours.   levothyroxine 25 MCG tablet Commonly known as:  SYNTHROID, LEVOTHROID Take 1.5 tabs by mouth daily. (must call and make f/u appt with labs for more refills)   promethazine 12.5 MG tablet Commonly known as:  PHENERGAN Take 12.5 mg by mouth See admin instructions.   Sronyx 0.1-20 MG-MCG tablet Generic drug:  levonorgestrel-ethinyl estradiol Take 1 tablet by mouth daily.   topiramate 25 MG tablet Commonly known as:  TOPAMAX TAKE 1 TABLET BY MOUTH TWICE A DAY        Review of Systems She has polycystic ovaries, on birth control pills  However she has been referred for possible hysterectomy recently        Examination:    There were no vitals taken for this visit.   No exam done, patient is on a virtual visit   Assessment:  HYPOTHYROIDISM with baseline TSH of 6, likely symptomatic   She had improved symptomatically with thyroid supplementation However appears to be still requiring only a relatively small dose of 37.5 mcg levothyroxine She was followed up a year after her last visit However her TSH is still quite normal  Likely does not have any progression of her autoimmune thyroid disease    PLAN:   She will continue 37.5 g of levothyroxine  Follow-up in 6 months on the same dosage Discussed that she can call if she has any persistent fatigue We will recheck her labs at that time we will also check free T3 level   Reather Littler 04/15/2019, 8:04 AM    Note: This office note was prepared with Dragon voice recognition system technology. Any transcriptional errors that result from this process are unintentional.

## 2019-04-21 ENCOUNTER — Telehealth (INDEPENDENT_AMBULATORY_CARE_PROVIDER_SITE_OTHER): Payer: BLUE CROSS/BLUE SHIELD | Admitting: Obstetrics & Gynecology

## 2019-04-21 ENCOUNTER — Other Ambulatory Visit: Payer: Self-pay

## 2019-04-21 DIAGNOSIS — R102 Pelvic and perineal pain: Secondary | ICD-10-CM

## 2019-04-21 NOTE — Patient Instructions (Signed)
She will need to schedule another visit to discuss u/s findings since she is not ready to make a decision today.

## 2019-04-21 NOTE — Progress Notes (Signed)
   TELEHEALTH VIRTUAL GYNECOLOGY VISIT ENCOUNTER NOTE  I connected with [name] on 04/21/19 at 10:00 AM EDT by Webex at home and verified that I am speaking with the correct person using two identifiers.   I discussed the limitations, risks, security and privacy concerns of performing an evaluation and management service by telephone and the availability of in person appointments. I also discussed with the patient that there may be a patient responsible charge related to this service. The patient expressed understanding and agreed to proceed.   History:  [name] is a 28 y.o. married [id number] (76 yo daughter)  female being evaluated today for pelvic pain and pelvic relaxation/cystocele.  She has been seen by GI, urology, and has had pelvic PT that did not help. She is referred by Dr. Helane Rima. The pain is constant and radiates to her back, described as burning and cramping. She has tried OCPs and is currently taking Sronyx, since 2018. Her pain is well controlled with OCPs.  She hasn't had a period since 11/17. She has dyspareunia, positional. She uses a lubricant at the start of sex.   She also complains of bloating and fatigue.   Past Medical History:  Diagnosis Date  . History of chicken pox   . SVD (spontaneous vaginal delivery) 05/09/2012  . Urinary tract infection   . Vertigo    Past Surgical History:  Procedure Laterality Date  . TONSILLECTOMY     adenoidectomy  . WISDOM TOOTH EXTRACTION     The following portions of the patient's history were reviewed and updated as appropriate: allergies, current medications, past family history, past medical history, past social history, past surgical history and problem list.   .   Review of Systems:  Pertinent items noted in HPI and remainder of comprehensive ROS otherwise negative.  Physical Exam:   General:  Alert, oriented and cooperative.   Mental Status: Normal mood and affect perceived. Normal judgment and thought  content.  Physical exam deferred due to nature of the encounter  Labs and Imaging No results found for this or any previous visit (from the past 336 hour(s)). No results found.    Assessment and Plan:     Pelvic pain- probable endometriosis I have suggested [place] versus continue OCPs. I did offer a diagnostic laparoscopy but told her that she would need to be off of her OCPs for a month prior. She would need to use some form of contraception.   She wants the dx laparoscopy so I have sent [name] a message to schedule this. I have also suggested that she consider institial cystitis.         I discussed the assessment and treatment plan with the patient. The patient was provided an opportunity to ask questions and all were answered. The patient agreed with the plan and demonstrated an understanding of the instructions.   The patient was advised to call back or seek an in-person evaluation/go to the ED if the symptoms worsen or if the condition fails to improve as anticipated.  I provided of non-face-to-face time during this encounter.   Allie Bossier, MD [facility] [employer]

## 2019-04-29 ENCOUNTER — Ambulatory Visit
Admission: RE | Admit: 2019-04-29 | Discharge: 2019-04-29 | Disposition: A | Discharge status: Home / Self Care | Payer: BLUE CROSS/BLUE SHIELD | Source: Ambulatory Visit | Attending: Obstetrics & Gynecology | Admitting: Obstetrics & Gynecology

## 2019-04-29 ENCOUNTER — Other Ambulatory Visit: Payer: Self-pay

## 2019-04-29 DIAGNOSIS — N839 Noninflammatory disorder of ovary, fallopian tube and broad ligament, unspecified: Secondary | ICD-10-CM | POA: Diagnosis not present

## 2019-04-29 DIAGNOSIS — R102 Pelvic and perineal pain: Secondary | ICD-10-CM | POA: Diagnosis not present

## 2019-04-30 ENCOUNTER — Other Ambulatory Visit: Payer: Self-pay | Admitting: Obstetrics & Gynecology

## 2019-04-30 ENCOUNTER — Other Ambulatory Visit: Payer: BLUE CROSS/BLUE SHIELD

## 2019-04-30 DIAGNOSIS — N83201 Unspecified ovarian cyst, right side: Secondary | ICD-10-CM | POA: Diagnosis not present

## 2019-04-30 DIAGNOSIS — N981 Hyperstimulation of ovaries: Secondary | ICD-10-CM | POA: Diagnosis not present

## 2019-04-30 DIAGNOSIS — R102 Pelvic and perineal pain: Secondary | ICD-10-CM | POA: Diagnosis not present

## 2019-04-30 NOTE — Progress Notes (Unsigned)
CA-125 and CEA ordered for right ovarian cyst seen on u/s

## 2019-05-01 LAB — CEA: CEA: 0.9 ng/mL (ref 0.0–4.7)

## 2019-05-01 LAB — CA 125: Cancer Antigen (CA) 125: 14.6 U/mL (ref 0.0–38.1)

## 2019-05-13 ENCOUNTER — Encounter: Payer: Self-pay | Admitting: Physical Therapy

## 2019-05-13 DIAGNOSIS — R350 Frequency of micturition: Secondary | ICD-10-CM | POA: Insufficient documentation

## 2019-05-18 ENCOUNTER — Encounter (HOSPITAL_BASED_OUTPATIENT_CLINIC_OR_DEPARTMENT_OTHER): Payer: Self-pay | Admitting: Obstetrics & Gynecology

## 2019-05-18 ENCOUNTER — Other Ambulatory Visit: Payer: Self-pay

## 2019-05-18 ENCOUNTER — Encounter: Payer: Self-pay | Admitting: Obstetrics & Gynecology

## 2019-05-18 ENCOUNTER — Ambulatory Visit (INDEPENDENT_AMBULATORY_CARE_PROVIDER_SITE_OTHER): Payer: BLUE CROSS/BLUE SHIELD | Admitting: Obstetrics & Gynecology

## 2019-05-18 VITALS — BP 131/82 | HR 72 | Wt 193.4 lb

## 2019-05-18 DIAGNOSIS — N83201 Unspecified ovarian cyst, right side: Secondary | ICD-10-CM | POA: Diagnosis not present

## 2019-05-18 DIAGNOSIS — R102 Pelvic and perineal pain: Secondary | ICD-10-CM | POA: Diagnosis not present

## 2019-05-18 NOTE — Progress Notes (Signed)
   Subjective:    Patient ID: [name], female    DOB: Apr 09, 1991, 28 y.o.   MRN: [id number]  HPI 28 yo married P63 (30 yo daughter) here for a pre op visit. She has a long h/o pelvic pain, midline with radiation all the way around her pelvis to her back. An u/s showed a 3.7 cm right ovarian cyst. CA-125 and CEA were normal. She is scheduled July 8 for a diagnostic laparoscopy and possible right ovarian cystectomy. She takes OCPs continuously. Pain is pretty much constant, now a level 5. The pain would be mostly a 8+ without OCPs.    Review of Systems She has never had surgeries in her abdomen.  She says that her last pap was with Dr. Ellyn Hack in 2019 and was normal, always normal.  She works at [employer].    Objective:   Physical Exam Breathing, conversing, and ambulating normally Well nourished, well hydrated White female, no apparent distress Abd- benign    Assessment & Plan:  Chronic pelvic pain, right ovarian cyst-  Plan for diagnostic laparoscopy, possible removal of right ovarian cyst/ovary if the cyst is still there. I have rec'd that she stop the OCPs prior to her laparoscopy and that she uses another form on birth control. I have explained that there is a 67% chance that I will find the etiology of her pain and 33% chance that I will not.

## 2019-06-07 ENCOUNTER — Other Ambulatory Visit: Payer: Self-pay | Admitting: Family Medicine

## 2019-06-07 DIAGNOSIS — G43009 Migraine without aura, not intractable, without status migrainosus: Secondary | ICD-10-CM

## 2019-06-23 IMAGING — CT CT ABD-PELV W/ CM
2 of 4 series · 16 of 46 positions shown, 18 images · IV contrast (APPLIED)
Comparison: None.

CLINICAL DATA: C/o back pain, nausea, and abdominal cramping x
weeks. Hx of ovarian cysts

EXAM:
CT ABDOMEN AND PELVIS WITH CONTRAST
TECHNIQUE: Multidetector CT imaging of the abdomen and pelvis was performed
using the standard protocol following bolus administration of
intravenous contrast.
CONTRAST:  100mL [id number] IOPAMIDOL ([id number]-6JJ) INJECTION 61%,
30mL [id number] IOPAMIDOL ([id number]) INJECTION 61%

[Series 2: axial st · axial · 0.91mm/px · z∈[-665,-230]mm · 13 of 97 slices shown, 15 images]
[im 5/97  soft-tissue]
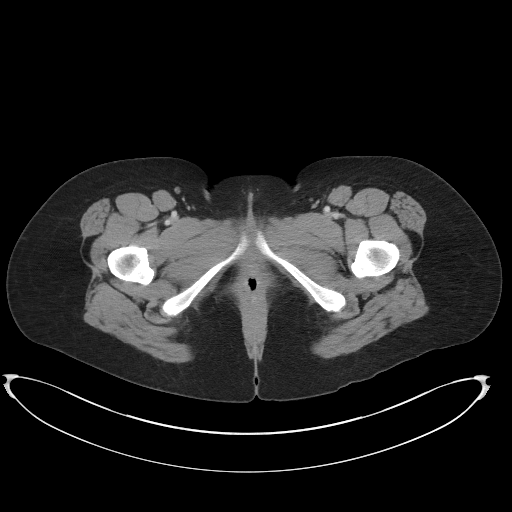
[im 5/97  bone]
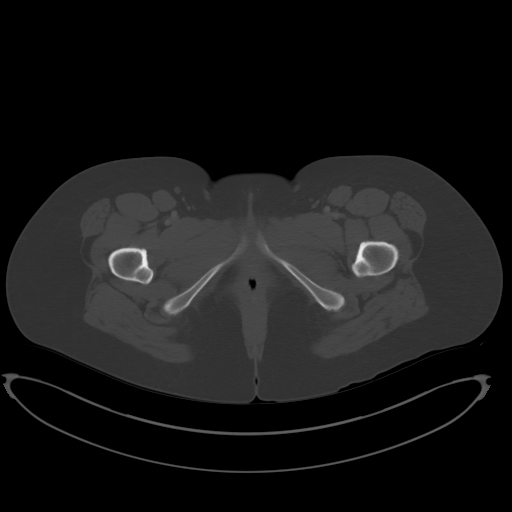
[im 15/97  soft-tissue]
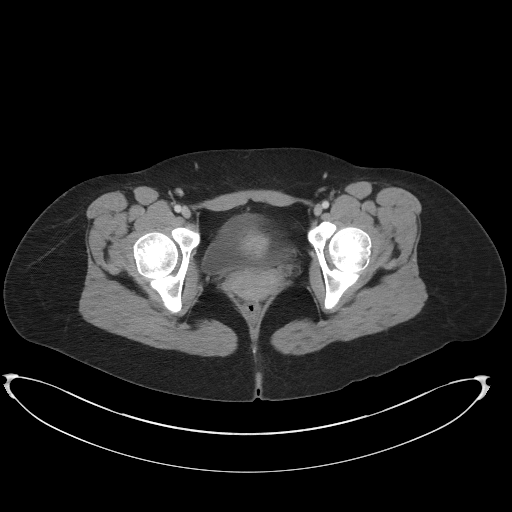
[im 20/97  soft-tissue]
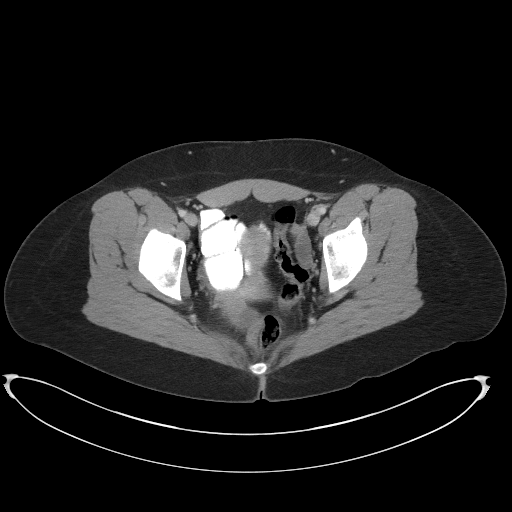
[im 29/97  soft-tissue]
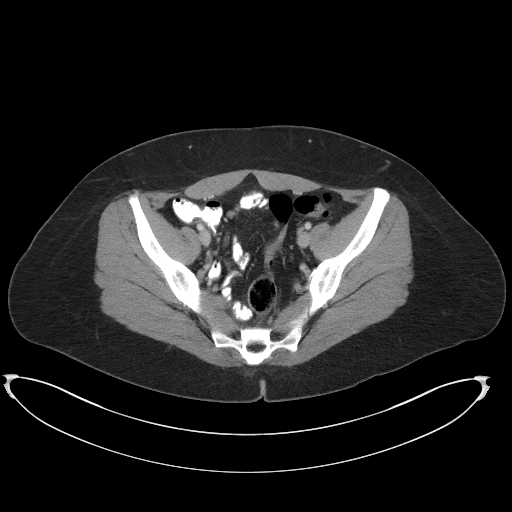
[im 34/97  soft-tissue]
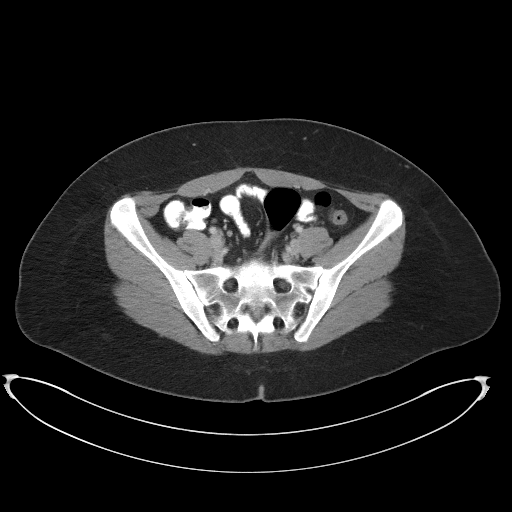
[im 44/97  soft-tissue]
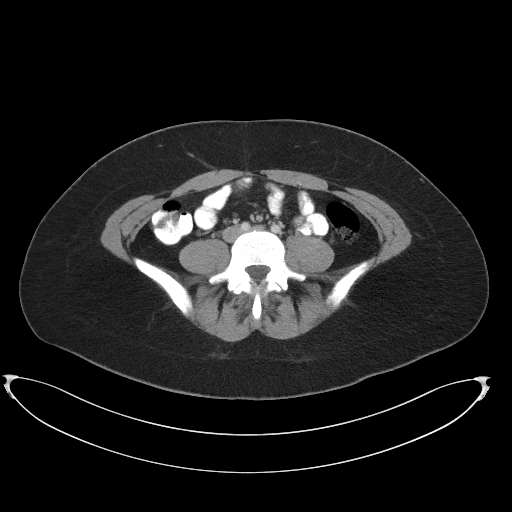
[im 49/97  soft-tissue]
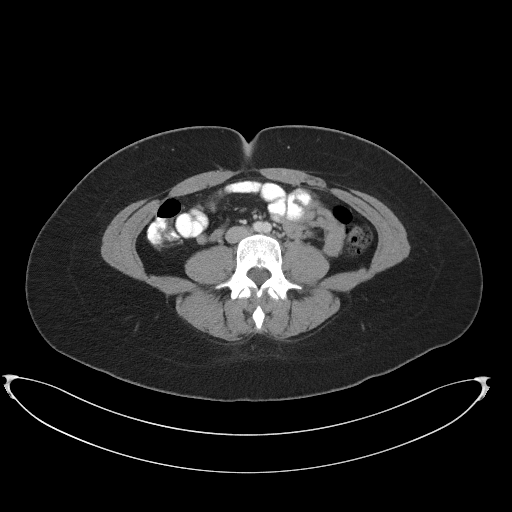
[im 53/97  soft-tissue]
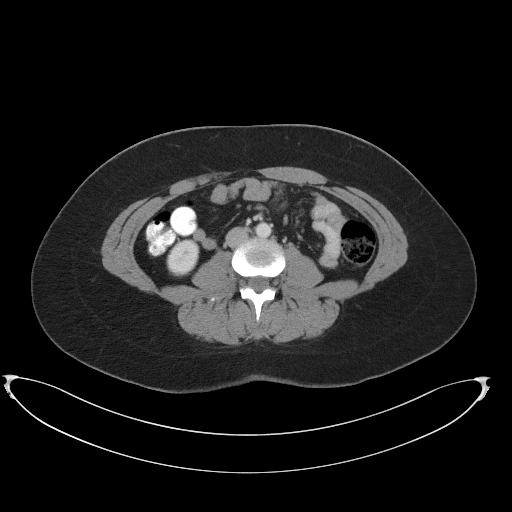
[im 63/97  soft-tissue]
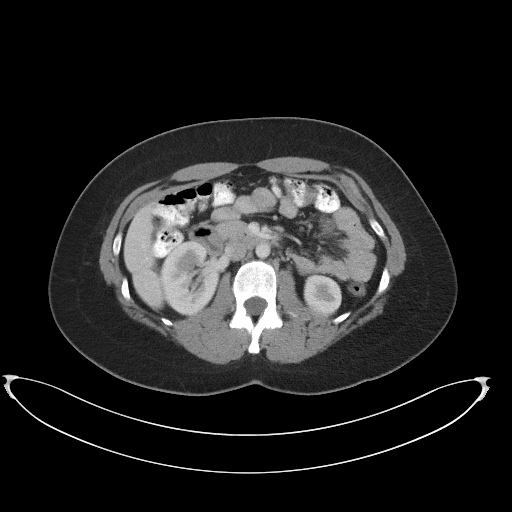
[im 63/97  bone]
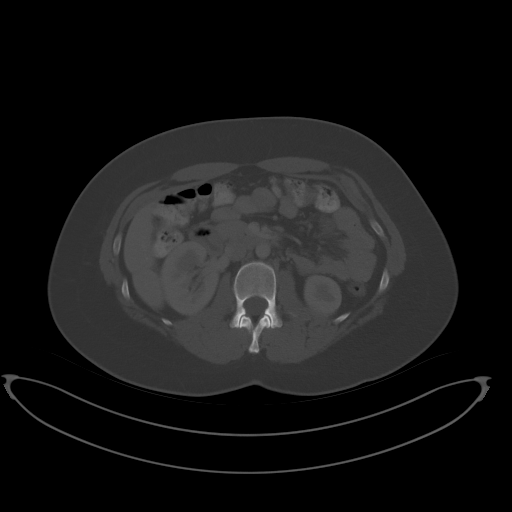
[im 68/97  soft-tissue]
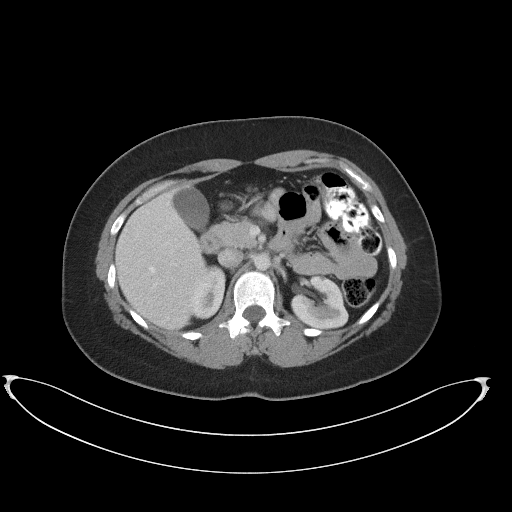
[im 77/97  soft-tissue]
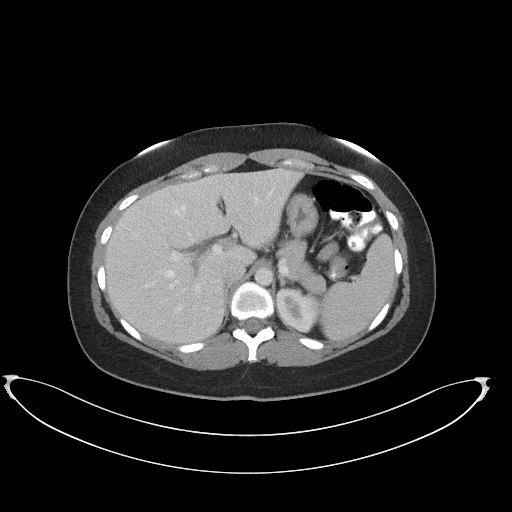
[im 82/97  soft-tissue]
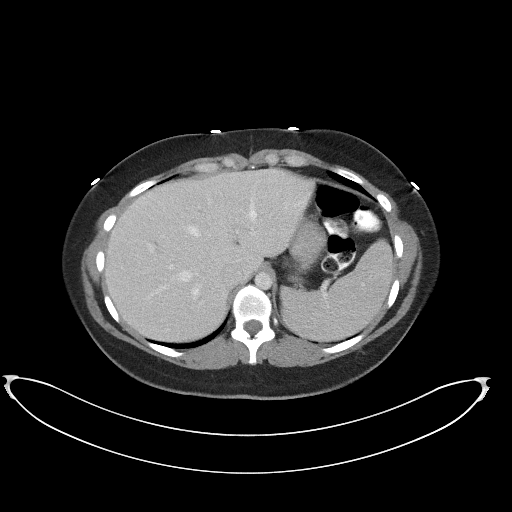
[im 92/97  soft-tissue]
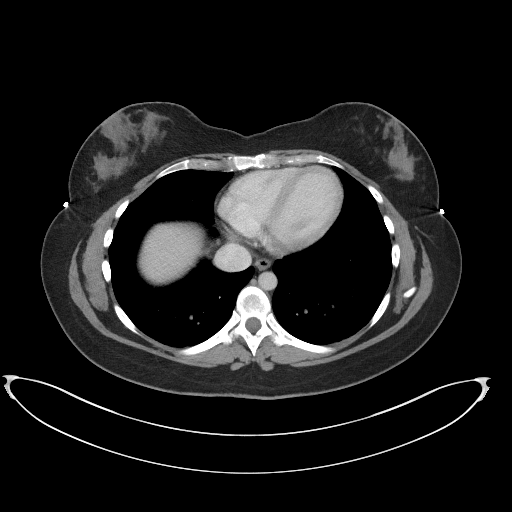

[Series 5: coronal st · coronal · 0.79mm/px · 3 of 91 slices shown]
[im 31/91  soft-tissue]
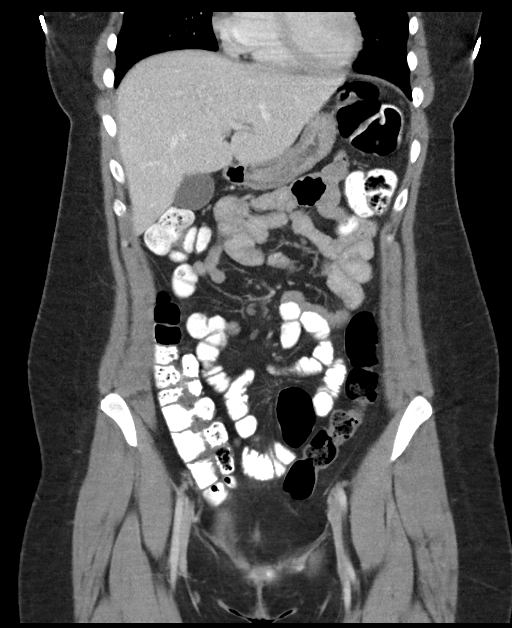
[im 41/91  soft-tissue]
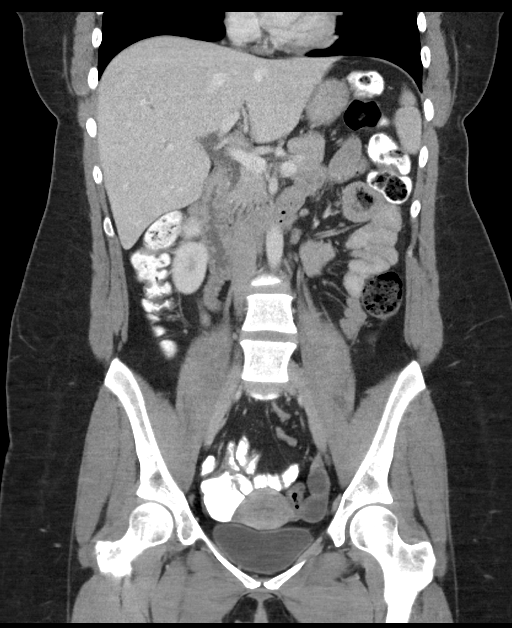
[im 51/91  soft-tissue]
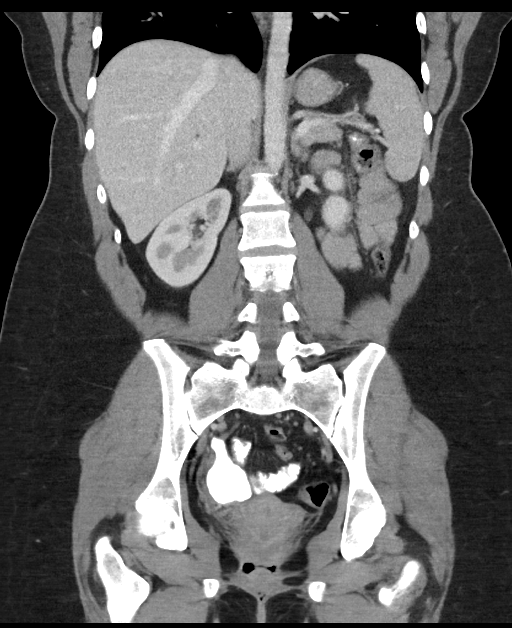

[16 of 46 positions shown; findings below may reference images not displayed]

FINDINGS: Lower chest: Clear lung bases.  Heart normal size.

Hepatobiliary: No focal liver abnormality is seen. No gallstones,
gallbladder wall thickening, or biliary dilatation.

Pancreas: Unremarkable. No pancreatic ductal dilatation or
surrounding inflammatory changes.

Spleen: Normal in size without focal abnormality.

Adrenals/Urinary Tract: No adrenal masses. 5 mm low-density lesion
along the anterior cortex of the left kidney at the midpole,
consistent with a cyst. No other renal masses, no stones and no
hydronephrosis. Ureters are normal in course and in caliber. Bladder
is unremarkable.

Stomach/Bowel: Stomach is within normal limits. Appendix appears
normal. No evidence of bowel wall thickening, distention, or
inflammatory changes.

Vascular/Lymphatic: No significant vascular findings are present. No
enlarged abdominal or pelvic lymph nodes.

Reproductive: Uterus and bilateral adnexa are unremarkable.

Other: No abdominal wall hernia or abnormality. No abdominopelvic
ascites.

Musculoskeletal: No acute or significant osseous findings.
IMPRESSION: 1. No acute findings within the abdomen or pelvis. No findings to
account for the patient's symptoms.
2. 5 mm low-density left renal lesion most likely a cyst.
3. No other abnormalities.

## 2019-06-30 ENCOUNTER — Encounter: Payer: Self-pay | Admitting: Family Medicine

## 2019-06-30 ENCOUNTER — Other Ambulatory Visit: Payer: Self-pay

## 2019-06-30 ENCOUNTER — Encounter (HOSPITAL_BASED_OUTPATIENT_CLINIC_OR_DEPARTMENT_OTHER): Payer: Self-pay | Admitting: *Deleted

## 2019-07-01 ENCOUNTER — Other Ambulatory Visit: Payer: Self-pay

## 2019-07-01 MED ORDER — VALACYCLOVIR HCL 1 G PO TABS: 1000.0000 mg | ORAL_TABLET | Freq: Two times a day (BID) | ORAL | 2 refills | Status: DC

## 2019-07-06 ENCOUNTER — Encounter (HOSPITAL_BASED_OUTPATIENT_CLINIC_OR_DEPARTMENT_OTHER)
Admission: RE | Admit: 2019-07-06 | Discharge: 2019-07-06 | Disposition: A | Discharge status: Home / Self Care | Payer: BC Managed Care – PPO | Source: Ambulatory Visit | Attending: Obstetrics & Gynecology | Admitting: Obstetrics & Gynecology

## 2019-07-06 ENCOUNTER — Other Ambulatory Visit: Payer: Self-pay

## 2019-07-06 ENCOUNTER — Other Ambulatory Visit (HOSPITAL_COMMUNITY)
Admission: RE | Admit: 2019-07-06 | Discharge: 2019-07-06 | Disposition: A | Discharge status: Home / Self Care | Payer: BC Managed Care – PPO | Source: Ambulatory Visit | Attending: Obstetrics & Gynecology | Admitting: Obstetrics & Gynecology

## 2019-07-06 DIAGNOSIS — Z1159 Encounter for screening for other viral diseases: Secondary | ICD-10-CM | POA: Insufficient documentation

## 2019-07-06 DIAGNOSIS — Z01812 Encounter for preprocedural laboratory examination: Secondary | ICD-10-CM | POA: Insufficient documentation

## 2019-07-06 LAB — POCT PREGNANCY, URINE: Preg Test, Ur: NEGATIVE

## 2019-07-07 LAB — SARS CORONAVIRUS 2 (TAT 6-24 HRS): SARS Coronavirus 2: NEGATIVE

## 2019-07-08 ENCOUNTER — Encounter (HOSPITAL_BASED_OUTPATIENT_CLINIC_OR_DEPARTMENT_OTHER): Payer: Self-pay

## 2019-07-08 ENCOUNTER — Ambulatory Visit (HOSPITAL_BASED_OUTPATIENT_CLINIC_OR_DEPARTMENT_OTHER): Payer: BC Managed Care – PPO | Admitting: Anesthesiology

## 2019-07-08 ENCOUNTER — Other Ambulatory Visit: Payer: Self-pay

## 2019-07-08 ENCOUNTER — Ambulatory Visit (HOSPITAL_BASED_OUTPATIENT_CLINIC_OR_DEPARTMENT_OTHER)
Admission: RE | Admit: 2019-07-08 | Discharge: 2019-07-08 | Disposition: A | Discharge status: Home / Self Care | Payer: BC Managed Care – PPO | Attending: Obstetrics & Gynecology | Admitting: Obstetrics & Gynecology

## 2019-07-08 ENCOUNTER — Encounter (HOSPITAL_BASED_OUTPATIENT_CLINIC_OR_DEPARTMENT_OTHER)
Admission: RE | Disposition: A | Discharge status: Home / Self Care | Payer: Self-pay | Source: Home / Self Care | Attending: Obstetrics & Gynecology

## 2019-07-08 DIAGNOSIS — N8311 Corpus luteum cyst of right ovary: Secondary | ICD-10-CM | POA: Diagnosis not present

## 2019-07-08 DIAGNOSIS — Z880 Allergy status to penicillin: Secondary | ICD-10-CM | POA: Insufficient documentation

## 2019-07-08 DIAGNOSIS — Z79890 Hormone replacement therapy (postmenopausal): Secondary | ICD-10-CM | POA: Diagnosis not present

## 2019-07-08 DIAGNOSIS — G43909 Migraine, unspecified, not intractable, without status migrainosus: Secondary | ICD-10-CM | POA: Insufficient documentation

## 2019-07-08 DIAGNOSIS — N83292 Other ovarian cyst, left side: Secondary | ICD-10-CM | POA: Diagnosis not present

## 2019-07-08 DIAGNOSIS — Z881 Allergy status to other antibiotic agents status: Secondary | ICD-10-CM | POA: Diagnosis not present

## 2019-07-08 DIAGNOSIS — N83201 Unspecified ovarian cyst, right side: Secondary | ICD-10-CM | POA: Diagnosis not present

## 2019-07-08 DIAGNOSIS — Z79899 Other long term (current) drug therapy: Secondary | ICD-10-CM | POA: Diagnosis not present

## 2019-07-08 DIAGNOSIS — N8312 Corpus luteum cyst of left ovary: Secondary | ICD-10-CM | POA: Insufficient documentation

## 2019-07-08 DIAGNOSIS — N83291 Other ovarian cyst, right side: Secondary | ICD-10-CM | POA: Diagnosis not present

## 2019-07-08 DIAGNOSIS — K658 Other peritonitis: Secondary | ICD-10-CM | POA: Diagnosis not present

## 2019-07-08 DIAGNOSIS — R102 Pelvic and perineal pain: Secondary | ICD-10-CM | POA: Diagnosis not present

## 2019-07-08 DIAGNOSIS — G8929 Other chronic pain: Secondary | ICD-10-CM | POA: Diagnosis not present

## 2019-07-08 DIAGNOSIS — E039 Hypothyroidism, unspecified: Secondary | ICD-10-CM | POA: Insufficient documentation

## 2019-07-08 DIAGNOSIS — K580 Irritable bowel syndrome with diarrhea: Secondary | ICD-10-CM | POA: Diagnosis not present

## 2019-07-08 DIAGNOSIS — N83202 Unspecified ovarian cyst, left side: Secondary | ICD-10-CM | POA: Diagnosis not present

## 2019-07-08 HISTORY — DX: Hypothyroidism, unspecified: E03.9

## 2019-07-08 HISTORY — DX: Headache, unspecified: R51.9

## 2019-07-08 HISTORY — PX: LAPAROSCOPIC OVARIAN CYSTECTOMY: SHX6248

## 2019-07-08 SURGERY — EXCISION, CYST, OVARY, LAPAROSCOPIC
Anesthesia: General | Site: Abdomen

## 2019-07-08 MED ORDER — ROCURONIUM BROMIDE 100 MG/10ML IV SOLN
INTRAVENOUS | Status: DC | PRN
  Administered 2019-07-08: 60 mg via INTRAVENOUS

## 2019-07-08 MED ORDER — MEPERIDINE HCL 25 MG/ML IJ SOLN: 6.2500 mg | INTRAMUSCULAR | Status: DC | PRN

## 2019-07-08 MED ORDER — SUGAMMADEX SODIUM 200 MG/2ML IV SOLN
INTRAVENOUS | Status: DC | PRN
  Administered 2019-07-08: 200 mg via INTRAVENOUS

## 2019-07-08 MED ORDER — ONDANSETRON HCL 4 MG/2ML IJ SOLN
INTRAMUSCULAR | Status: AC
  Filled 2019-07-08: qty 2

## 2019-07-08 MED ORDER — METOCLOPRAMIDE HCL 5 MG/ML IJ SOLN: 10.0000 mg | Freq: Once | INTRAMUSCULAR | Status: DC | PRN

## 2019-07-08 MED ORDER — PROPOFOL 10 MG/ML IV BOLUS
INTRAVENOUS | Status: DC | PRN
  Administered 2019-07-08: 180 mg via INTRAVENOUS

## 2019-07-08 MED ORDER — OXYCODONE-ACETAMINOPHEN 5-325 MG PO TABS: 1.0000 | ORAL_TABLET | Freq: Four times a day (QID) | ORAL | 0 refills | Status: DC | PRN

## 2019-07-08 MED ORDER — MIDAZOLAM HCL 2 MG/2ML IJ SOLN
INTRAMUSCULAR | Status: AC
  Filled 2019-07-08: qty 2

## 2019-07-08 MED ORDER — IBUPROFEN 600 MG PO TABS: 600.0000 mg | ORAL_TABLET | Freq: Four times a day (QID) | ORAL | 1 refills | Status: DC | PRN

## 2019-07-08 MED ORDER — SCOPOLAMINE 1 MG/3DAYS TD PT72: 1.0000 | MEDICATED_PATCH | Freq: Once | TRANSDERMAL | Status: DC

## 2019-07-08 MED ORDER — LACTATED RINGERS IV SOLN
INTRAVENOUS | Status: DC
  Administered 2019-07-08: 10:00:00 via INTRAVENOUS

## 2019-07-08 MED ORDER — LIDOCAINE 2% (20 MG/ML) 5 ML SYRINGE
INTRAMUSCULAR | Status: AC
  Filled 2019-07-08: qty 5

## 2019-07-08 MED ORDER — ONDANSETRON HCL 4 MG/2ML IJ SOLN
INTRAMUSCULAR | Status: DC | PRN
  Administered 2019-07-08: 4 mg via INTRAVENOUS

## 2019-07-08 MED ORDER — LIDOCAINE HCL (CARDIAC) PF 100 MG/5ML IV SOSY
PREFILLED_SYRINGE | INTRAVENOUS | Status: DC | PRN
  Administered 2019-07-08: 60 mg via INTRAVENOUS

## 2019-07-08 MED ORDER — KETOROLAC TROMETHAMINE 30 MG/ML IJ SOLN
INTRAMUSCULAR | Status: DC | PRN
  Administered 2019-07-08: 30 mg via INTRAVENOUS

## 2019-07-08 MED ORDER — FENTANYL CITRATE (PF) 100 MCG/2ML IJ SOLN
50.0000 ug | INTRAMUSCULAR | Status: DC | PRN
  Administered 2019-07-08 (×2): 50 ug via INTRAVENOUS

## 2019-07-08 MED ORDER — ROCURONIUM BROMIDE 10 MG/ML (PF) SYRINGE
PREFILLED_SYRINGE | INTRAVENOUS | Status: AC
  Filled 2019-07-08: qty 10

## 2019-07-08 MED ORDER — FENTANYL CITRATE (PF) 100 MCG/2ML IJ SOLN
INTRAMUSCULAR | Status: AC
  Filled 2019-07-08: qty 2

## 2019-07-08 MED ORDER — BUPIVACAINE HCL (PF) 0.5 % IJ SOLN
INTRAMUSCULAR | Status: AC
  Filled 2019-07-08: qty 30

## 2019-07-08 MED ORDER — BUPIVACAINE HCL (PF) 0.5 % IJ SOLN
INTRAMUSCULAR | Status: DC | PRN
  Administered 2019-07-08: 30 mL

## 2019-07-08 MED ORDER — SODIUM CHLORIDE 0.9 % IR SOLN
Status: DC | PRN
  Administered 2019-07-08: 3000 mL

## 2019-07-08 MED ORDER — MIDAZOLAM HCL 2 MG/2ML IJ SOLN
1.0000 mg | INTRAMUSCULAR | Status: DC | PRN
  Administered 2019-07-08: 11:00:00 2 mg via INTRAVENOUS

## 2019-07-08 MED ORDER — DEXAMETHASONE SODIUM PHOSPHATE 4 MG/ML IJ SOLN
INTRAMUSCULAR | Status: DC | PRN
  Administered 2019-07-08: 5 mg via INTRAVENOUS

## 2019-07-08 MED ORDER — FENTANYL CITRATE (PF) 100 MCG/2ML IJ SOLN
25.0000 ug | INTRAMUSCULAR | Status: DC | PRN
  Administered 2019-07-08: 25 ug via INTRAVENOUS

## 2019-07-08 MED ORDER — LACTATED RINGERS IV SOLN: INTRAVENOUS | Status: DC

## 2019-07-08 SURGICAL SUPPLY — 38 items
ADH SKN CLS APL DERMABOND .7 (GAUZE/BANDAGES/DRESSINGS)
BAG SPEC RTRVL LRG 6X4 10 (ENDOMECHANICALS)
BRIEF STRETCH FOR OB PAD XXL (UNDERPADS AND DIAPERS) ×3 IMPLANT
CABLE HIGH FREQUENCY MONO STRZ (ELECTRODE) IMPLANT
DERMABOND ADVANCED (GAUZE/BANDAGES/DRESSINGS)
DERMABOND ADVANCED .7 DNX12 (GAUZE/BANDAGES/DRESSINGS) IMPLANT
DRSG OPSITE POSTOP 3X4 (GAUZE/BANDAGES/DRESSINGS) IMPLANT
DURAPREP 26ML APPLICATOR (WOUND CARE) ×3 IMPLANT
ELECT REM PT RETURN 9FT ADLT (ELECTROSURGICAL)
ELECTRODE REM PT RTRN 9FT ADLT (ELECTROSURGICAL) IMPLANT
GLOVE BIO SURGEON STRL SZ 6.5 (GLOVE) ×3 IMPLANT
GLOVE BIOGEL PI IND STRL 7.0 (GLOVE) ×4 IMPLANT
GLOVE BIOGEL PI INDICATOR 7.0 (GLOVE) ×2
GOWN STRL REUS W/ TWL XL LVL3 (GOWN DISPOSABLE) ×2 IMPLANT
GOWN STRL REUS W/TWL LRG LVL3 (GOWN DISPOSABLE) ×3 IMPLANT
GOWN STRL REUS W/TWL XL LVL3 (GOWN DISPOSABLE) ×3
IV NS IRRIG 3000ML ARTHROMATIC (IV SOLUTION) ×1 IMPLANT
NEEDLE INSUFFLATION 120MM (ENDOMECHANICALS) ×3 IMPLANT
NS IRRIG 1000ML POUR BTL (IV SOLUTION) ×3 IMPLANT
PACK LAPAROSCOPY BASIN (CUSTOM PROCEDURE TRAY) ×3 IMPLANT
PACK TRENDGUARD 450 HYBRID PRO (MISCELLANEOUS) IMPLANT
PACK TRENDGUARD 600 HYBRD PROC (MISCELLANEOUS) IMPLANT
PAD OB MATERNITY 4.3X12.25 (PERSONAL CARE ITEMS) ×3 IMPLANT
PAD PREP 24X48 CUFFED NSTRL (MISCELLANEOUS) ×3 IMPLANT
POUCH SPECIMEN RETRIEVAL 10MM (ENDOMECHANICALS) IMPLANT
SET IRRIG TUBING LAPAROSCOPIC (IRRIGATION / IRRIGATOR) ×1 IMPLANT
SET TUBE SMOKE EVAC HIGH FLOW (TUBING) ×3 IMPLANT
SHEARS HARMONIC ACE PLUS 36CM (ENDOMECHANICALS) ×1 IMPLANT
SLEEVE SCD COMPRESS KNEE MED (MISCELLANEOUS) ×3 IMPLANT
SLEEVE XCEL OPT CAN 5 100 (ENDOMECHANICALS) ×2 IMPLANT
SUT VICRYL 0 UR6 27IN ABS (SUTURE) IMPLANT
SUT VICRYL 4-0 PS2 18IN ABS (SUTURE) IMPLANT
TOWEL GREEN STERILE FF (TOWEL DISPOSABLE) ×6 IMPLANT
TRENDGUARD 450 HYBRID PRO PACK (MISCELLANEOUS) ×3
TRENDGUARD 600 HYBRID PROC PK (MISCELLANEOUS)
TROCAR OPTI TIP 5M 100M (ENDOMECHANICALS) ×3 IMPLANT
TROCAR XCEL NON-BLD 11X100MML (ENDOMECHANICALS) IMPLANT
WARMER LAPAROSCOPE (MISCELLANEOUS) ×3 IMPLANT

## 2019-07-08 NOTE — Anesthesia Preprocedure Evaluation (Signed)
Anesthesia Evaluation  Patient identified by MRN, date of birth, ID band Patient awake    Reviewed: Allergy & Precautions, NPO status , Patient's Chart, lab work & pertinent test results  Airway Mallampati: II  TM Distance: >3 FB Neck ROM: Full    Dental no notable dental hx.    Pulmonary neg pulmonary ROS,    Pulmonary exam normal breath sounds clear to auscultation       Cardiovascular negative cardio ROS Normal cardiovascular exam Rhythm:Regular Rate:Normal     Neuro/Psych negative neurological ROS  negative psych ROS   GI/Hepatic negative GI ROS, Neg liver ROS,   Endo/Other  Hypothyroidism   Renal/GU negative Renal ROS  negative genitourinary   Musculoskeletal negative musculoskeletal ROS (+)   Abdominal   Peds negative pediatric ROS (+)  Hematology negative hematology ROS (+)   Anesthesia Other Findings   Reproductive/Obstetrics negative OB ROS                             Anesthesia Physical Anesthesia Plan  ASA: II  Anesthesia Plan: General   Post-op Pain Management:    Induction: Intravenous  PONV Risk Score and Plan: 4 or greater and Ondansetron, Dexamethasone, Midazolam and Treatment may vary due to age or medical condition  Airway Management Planned: Oral ETT  Additional Equipment:   Intra-op Plan:   Post-operative Plan: Extubation in OR  Informed Consent: I have reviewed the patients History and Physical, chart, labs and discussed the procedure including the risks, benefits and alternatives for the proposed anesthesia with the patient or authorized representative who has indicated his/her understanding and acceptance.     Dental advisory given  Plan Discussed with: CRNA  Anesthesia Plan Comments:         Anesthesia Quick Evaluation

## 2019-07-08 NOTE — Op Note (Signed)
07/08/2019  12:23 PM  PATIENT:  [name]  28 y.o. female  PRE-OPERATIVE DIAGNOSIS:  Pelvic Pain, right ovarian cyst  POST-OPERATIVE DIAGNOSIS:  Pelvic Pain, bilateral ovarian cysts  PROCEDURE:  Procedure(s): LAPAROSCOPY DIAGNOSTIC, Bilateral Ovarian Cystectomy, Peritoneal Biopsies (N/A)  SURGEON:  Surgeon(s) and Role:    * Andreasen, Wilhemina Cash, MD - Primary  ANESTHESIA:   local and general  EBL:  10 mL   BLOOD ADMINISTERED:none  DRAINS: none   LOCAL MEDICATIONS USED:  MARCAINE     SPECIMEN:  Source of Specimen:  bilateral cyst walls from ovarian cysts  DISPOSITION OF SPECIMEN:  PATHOLOGY  COUNTS:  YES  TOURNIQUET:  * No tourniquets in log *  DICTATION: .Dragon Dictation  PLAN OF CARE: Discharge to home after PACU  PATIENT DISPOSITION:  PACU - hemodynamically stable.   Delay start of Pharmacological VTE agent (>24hrs) due to surgical blood loss or risk of bleeding: not applicable  The risks, benefits, alternatives of surgery were explained understood, accepted. All questions were answered. Her urine pregnancy test was negative. She was taken to the operating room and general anesthesia was applied without complication. She was placed in dorsal lithotomy position. Her abdomen and vagina were  prepped and draped in the usual sterile fashion. A time out procedure was done. A bimanual exam revealed a normal, size and shape mobile uterus. A Hulka manipulator was placed on the cervix. Gloves were changed and attention was turned to the abdomen. Approximately 10 mL of 0.5% Marcaine was used to infiltrate the subcutaneous tissue in the umbilicus. A 71mm incision was made. A Veres needle was placed in the pelvis. Low-flow CO2 was used to insufflate the abdomen to approximately 3 L. After good pneumoperitoneum was established, a 5 mm trocar was placed. Laparoscopy confirmed correct placement. She was placed in the Trendelenburg position. Patient abdominal pressure was always less than 15. The  upper abdomen was normal. The gallbladder was prominent. The uterus appeared normal. Both ovaries were each larger than the uterus. The left ovary had one simple cyst, about 4 cm. The right ovary had 3 simple cysts, each about 3-4 cm. I placed a 5 mm port in each lower quadrant under laparoscopic visualization after injecting 10 cc of 0.5% marcaine at each site.  I opened each ovarian cyst the a harmonic scalpel, taking the cyst wall of 2. Hemostasis was noted. I inspected each cul de sac. In the posterior cul de sac just lateral to each uterosacral ligament, there were some flesh colored very tiny bumps, each section about 5 mm in diameter. These were not consistent with endometriosis, but were not something that I could identify. I biopsied the area on the left. Hemostasis was noted. There was another 5 mm collection of these tiny bumps on the left pelvic sidewall, about halfway up. I biopsied this area as well. Hemostasis was noted. I irrigated and cleaned the pelvis.  The CO2 was allowed to escape from her abdomen. The subcutaneous tissue was closed with 4-0 Vicryl suture. The Hulka manipulator was removed. She was extubated and taken to recovery in stable condition. She tolerated the procedure well. The instrument, sponge, and needle counts were correct.

## 2019-07-08 NOTE — Anesthesia Postprocedure Evaluation (Signed)
Anesthesia Post Note  Patient: [name]  Procedure(s) Performed: LAPAROSCOPY DIAGNOSTIC, Bilateral Ovarian Cystectomy, Peritoneal Biopsies (N/A Abdomen)     Patient location during evaluation: PACU Anesthesia Type: General Level of consciousness: awake and alert Pain management: pain level controlled Vital Signs Assessment: post-procedure vital signs reviewed and stable Respiratory status: spontaneous breathing, nonlabored ventilation, respiratory function stable and patient connected to nasal cannula oxygen Cardiovascular status: blood pressure returned to baseline and stable Postop Assessment: no apparent nausea or vomiting Anesthetic complications: no    Last Vitals:  Vitals:   07/08/19 1245 07/08/19 1317  BP: 101/64 110/78  Pulse: (!) 52 (!) 56  Resp: 14 16  Temp:  36.8 C  SpO2: 100% 100%    Last Pain:  Vitals:   07/08/19 1317  TempSrc:   PainSc: 0-No pain                 Montez Hageman

## 2019-07-08 NOTE — Discharge Instructions (Signed)
No NSAIDS (ibuprofen, Advil, etc) before 5:45pm today.  Diagnostic Laparoscopy, Care After This sheet gives you information about how to care for yourself after your procedure. Your health care provider may also give you more specific instructions. If you have problems or questions, contact your health care provider. What can I expect after the procedure? After the procedure, it is common to have:  Mild discomfort in the abdomen.  Sore throat. Women who have laparoscopy with pelvic examination may have mild cramping and fluid coming from the vagina for a few days after the procedure. Follow these instructions at home: Medicines  Take over-the-counter and prescription medicines only as told by your health care provider.  If you were prescribed an antibiotic medicine, take it as told by your health care provider. Do not stop taking the antibiotic even if you start to feel better. Driving  Do not drive for 24 hours if you were given a medicine to help you relax (sedative) during your procedure.  Do not drive or use heavy machinery while taking prescription pain medicine. Bathing  Do not take baths, swim, or use a hot tub until your health care provider approves. You may take showers. Incision care   Follow instructions from your health care provider about how to take care of your incisions. Make sure you: ? Wash your hands with soap and water before you change your bandage (dressing). If soap and water are not available, use hand sanitizer. ? Change your dressing as told by your health care provider. ? Leave stitches (sutures), skin glue, or adhesive strips in place. These skin closures may need to stay in place for 2 weeks or longer. If adhesive strip edges start to loosen and curl up, you may trim the loose edges. Do not remove adhesive strips completely unless your health care provider tells you to do that.  Check your incision areas every day for signs of infection. Check  for: ? Redness, swelling, or pain. ? Fluid or blood. ? Warmth. ? Pus or a bad smell. Activity  Return to your normal activities as told by your health care provider. Ask your health care provider what activities are safe for you.  Do not lift anything that is heavier than 10 lb (4.5 kg), or the limit that you are told, until your health care provider says that it is safe. General instructions  To prevent or treat constipation while you are taking prescription pain medicine, your health care provider may recommend that you: ? Drink enough fluid to keep your urine pale yellow. ? Take over-the-counter or prescription medicines. ? Eat foods that are high in fiber, such as fresh fruits and vegetables, whole grains, and beans. ? Limit foods that are high in fat and processed sugars, such as fried and sweet foods.  Do not use any products that contain nicotine or tobacco, such as cigarettes and e-cigarettes. If you need help quitting, ask your health care provider.  Keep all follow-up visits as told by your health care provider. This is important. Contact a health care provider if:  You develop shoulder pain.  You feel lightheaded or faint.  You are unable to pass gas or have a bowel movement.  You feel nauseous or you vomit.  You develop a rash.  You have redness, swelling, or pain around any incision.  You have fluid or blood coming from any incision.  Any incision feels warm to the touch.  You have pus or a bad smell coming from any incision.  You have a fever or chills. Get help right away if:  You have severe pain.  You have vomiting that does not go away.  You have heavy bleeding from the vagina.  Any incision opens.  You have trouble breathing.  You have chest pain. Summary  After the procedure, it is common to have mild discomfort in the abdomen and a sore throat.  Check your incision areas every day for signs of infection.  Return to your normal  activities as told by your health care provider. Ask your health care provider what activities are safe for you. This information is not intended to replace advice given to you by your health care provider. Make sure you discuss any questions you have with your health care provider. Document Released: 11/28/2015 Document Revised: 11/29/2017 Document Reviewed: 06/12/2017 Elsevier Patient Education  [place] Instructions  Activity: Get plenty of rest for the remainder of the day. A responsible individual must stay with you for 24 hours following the procedure.  For the next 24 hours, DO NOT: -Drive a car -Paediatric nurse -Drink alcoholic beverages -Take any medication unless instructed by your physician -Make any legal decisions or sign important papers.  Meals: Start with liquid foods such as gelatin or soup. Progress to regular foods as tolerated. Avoid greasy, spicy, heavy foods. If nausea and/or vomiting occur, drink only clear liquids until the nausea and/or vomiting subsides. Call your physician if vomiting continues.  Special Instructions/Symptoms: Your throat may feel dry or sore from the anesthesia or the breathing tube placed in your throat during surgery. If this causes discomfort, gargle with warm salt water. The discomfort should disappear within 24 hours.  If you had a scopolamine patch placed behind your ear for the management of post- operative nausea and/or vomiting:  1. The medication in the patch is effective for 72 hours, after which it should be removed.  Wrap patch in a tissue and discard in the trash. Wash hands thoroughly with soap and water. 2. You may remove the patch earlier than 72 hours if you experience unpleasant side effects which may include dry mouth, dizziness or visual disturbances. 3. Avoid touching the patch. Wash your hands with soap and water after contact with the patch.

## 2019-07-08 NOTE — Progress Notes (Signed)
Notified Dr. Marcell Barlow that patient has been bradycardic with HR in 2s. Notifed that patient is otherwise doing well. Okay to proceed with discharge per Dr. Marcell Barlow.

## 2019-07-08 NOTE — Transfer of Care (Signed)
Immediate Anesthesia Transfer of Care Note  Patient: [name]  Procedure(s) Performed: LAPAROSCOPY DIAGNOSTIC, Bilateral Ovarian Cystectomy, Peritoneal Biopsies (N/A Abdomen)  Patient Location: PACU  Anesthesia Type:General  Level of Consciousness: awake, alert  and oriented  Airway & Oxygen Therapy: Patient Spontanous Breathing and Patient connected to nasal cannula oxygen  Post-op Assessment: Report given to RN and Post -op Vital signs reviewed and stable  Post vital signs: Reviewed and stable  Last Vitals:  Vitals Value Taken Time  BP    Temp    Pulse 78 07/08/19 1204  Resp 14 07/08/19 1204  SpO2 100 % 07/08/19 1204  Vitals shown include unvalidated device data.  Last Pain:  Vitals:   07/08/19 0948  TempSrc: Oral  PainSc: 0-No pain         Complications: No apparent anesthesia complications

## 2019-07-08 NOTE — H&P (Signed)
[name] is an 28 yo married P1 (20 yo daughter) here for a diagnostic laparoscopy and possible right ovarian cystectomy. She has a long h/o pelvic pain, midline with radiation all the way around her pelvis to her back. An u/s showed a 3.7 cm right ovarian cyst. CA-125 and CEA were normal.  She took OCPs continuously. She stopped them a month ago. She has used withdrawal for contraception since then. She has been amenorrheic since 2017. Pain is pretty much constant, now a level 5. The pain would be mostly a 8+ without OCPs.    Patient's last menstrual period was 11/29/2016 (exact date).    Past Medical History:  Diagnosis Date  . Headache    Migraines  . History of chicken pox   . Hypothyroidism   . SVD (spontaneous vaginal delivery) 05/09/2012  . Urinary tract infection   . Vertigo     Past Surgical History:  Procedure Laterality Date  . TONSILLECTOMY     adenoidectomy  . WISDOM TOOTH EXTRACTION      Family History  Problem Relation Age of Onset  . Diabetes Maternal Grandmother   . Breast cancer Maternal Grandmother   . Parkinson's disease Maternal Grandmother   . Thyroid disease Father   . Kidney disease Paternal Grandmother   . Thyroid disease Paternal Grandmother   . Healthy Brother   . Healthy Daughter   . Anesthesia problems Neg Hx     Social History:  reports that she has never smoked. She has never used smokeless tobacco. She reports that she does not drink alcohol or use drugs.  Allergies:  Allergies  Allergen Reactions  . Erythromycin   . Penicillins Hives    Can tolerate cephalosporins    Medications Prior to Admission  Medication Sig Dispense Refill Last Dose  . dicyclomine (BENTYL) 20 MG tablet Take 20 mg by mouth every 6 (six) hours.   07/08/2019 at [place]  . levothyroxine (SYNTHROID, LEVOTHROID) 25 MCG tablet Take 1.5 tabs by mouth daily. (must call and make f/u appt with labs for more refills) 135 tablet 1 07/08/2019 at 0715  . SRONYX 0.1-20 MG-MCG  tablet Take 1 tablet by mouth daily.  0 Past Month at Unknown time  . topiramate (TOPAMAX) 25 MG tablet TAKE 1 TABLET BY MOUTH TWICE A DAY 180 tablet 0 07/08/2019 at 0715  . valACYclovir (VALTREX) 1000 MG tablet Take 1 tablet (1,000 mg total) by mouth 2 (two) times daily. 6 tablet 2 Past Week at Unknown time    ROS  Blood pressure 124/82, pulse 71, temperature 98.1 F (36.7 C), temperature source Oral, resp. rate 18, height 5\' 6"  (1.676 m), weight 85.2 kg, last menstrual period 11/29/2016, SpO2 99 %. Physical Exam Breathing, conversing, and ambulating normally Well nourished, well hydrated White female, no apparent distress Heart- rrr Lungs- CTAB Abd- benign  No results found for this or any previous visit (from the past 24 hour(s)).  No results found.  Assessment/Plan: Chronic pelvic pain- plan for diagnostic laparoscopy  She understands the risks of surgery, including, but not to infection, bleeding, DVTs, damage to bowel, bladder, ureters. She wishes to proceed.     MELAINIE KRINSKY 07/08/2019, 10:47 AM

## 2019-07-08 NOTE — Anesthesia Procedure Notes (Signed)
Procedure Name: Intubation Date/Time: 07/08/2019 11:05 AM Performed by: Bufford Spikes, CRNA Pre-anesthesia Checklist: Patient identified, Emergency Drugs available, Suction available and Patient being monitored Patient Re-evaluated:Patient Re-evaluated prior to induction Oxygen Delivery Method: Circle system utilized Preoxygenation: Pre-oxygenation with 100% oxygen Induction Type: IV induction Ventilation: Mask ventilation without difficulty Laryngoscope Size: Miller and 2 Grade View: Grade II Tube type: Oral Tube size: 7.0 mm Number of attempts: 1 Airway Equipment and Method: Stylet and Oral airway Placement Confirmation: ETT inserted through vocal cords under direct vision,  positive ETCO2 and breath sounds checked- equal and bilateral Secured at: 21 cm Tube secured with: Tape Dental Injury: Teeth and Oropharynx as per pre-operative assessment

## 2019-07-09 ENCOUNTER — Encounter (HOSPITAL_BASED_OUTPATIENT_CLINIC_OR_DEPARTMENT_OTHER): Payer: Self-pay | Admitting: Obstetrics & Gynecology

## 2019-07-20 ENCOUNTER — Telehealth: Payer: Self-pay | Admitting: Family Medicine

## 2019-07-20 ENCOUNTER — Encounter: Payer: Self-pay | Admitting: Family Medicine

## 2019-07-20 NOTE — Telephone Encounter (Signed)
Pt going to [facility] for Covid test per MyChart message chain.

## 2019-07-20 NOTE — Telephone Encounter (Signed)
[facility] - [place] TELEPHONE ADVICE RECORD AccessNurse Patient Name: Hannah MuseSARAH Kincer Gender: Female DOB: [birth year] Age: 28 Y 3 M 24 D Return Phone Number: [phone] (Primary) Address: [place] [id number] Client [facility] - Human resources officerClie Client Site [facility] Physician Helane RimaWallace, Erica- DO Contact Type Call Who Is Calling Patient / Member / Family / Caregiver Call Type Triage / Clinical Relationship To Patient Self Return Phone Number [phone] (Primary) Chief Complaint Sore Throat Reason for Call Symptomatic / Request for Health Information Initial Comment Caller requesting info on COVID testing. States she has a sore throat, fever (no fever right now), body aches, nasal congestion. Someone at her work is positive. Translation No Nurse Assessment Nurse: Jetty PeeksWeiss-Hilton, RN, Lillia AbedLindsay Date/Time (Eastern Time): 07/19/2019 9:19:49 AM Confirm and document reason for call. If symptomatic, describe symptoms. ---Caller states she is requesting info on COVID testing. States current sx throat, body aches, nasal congestion. No fever. States she was exposed to someone on Wednesday who was dx'd positive for COVID19 and someone else who had sx of covid on Friday. Current temp is 98.5 oral. Has the patient had close contact with a person known or suspected to have the novel coronavirus illness OR traveled / lives in area with major community spread (including international travel) in the last 14 days from the onset of symptoms? * If Asymptomatic, screen for exposure and travel within the last 14 days. ---Yes Does the patient have any new or worsening symptoms? ---Yes Will a triage be completed? ---Yes Related visit to physician within the last 2 weeks? ---Yes Does the PT have any chronic conditions? (i.e. diabetes, asthma, this includes High risk factors for pregnancy, etc.)  ---Yes List chronic conditions. ---hypothyroidism, "traces" of colitis, OCPs Is the patient pregnant or possibly pregnant? (Ask all females between the ages of 12-55) ---No Is this a behavioral health or substance abuse call? ---No PLEASE NOTE: All timestamps contained within this report are represented as [place] Standard Time. CONFIDENTIALTY NOTICE: This fax transmission is intended only for the addressee. It contains information that is legally privileged, confidential or otherwise protected from use or disclosure. If you are not the intended recipient, you are strictly prohibited from reviewing, disclosing, copying using or disseminating any of this information or taking any action in reliance on or regarding this information. If you have received this fax in error, please notify us immediately by telephone so that we can arrange for its return to us. Phone: [phone], Toll-Free: [phone], Fax: [phone] Page: 2 of 3 Call Id: [id number] Guidelines Guideline Title Affirmed Question Affirmed Notes Nurse Date/Time Lamount Cohen(Eastern Time) Coronavirus (COVID-19) - Diagnosed or Suspected [1] COVID-19 infection suspected by caller or triager AND [2] mild symptoms (cough, fever, or others) AND [3] no complications or SOB Weiss-Hilton, RN, Lillia AbedLindsay 07/19/2019 9:23:20 AM Disp. Time Lamount Cohen(Eastern Time) Disposition Final User 07/19/2019 9:28:01 AM Call PCP when Office is Open Yes Weiss-Hilton, RN, Augustina MoodLindsay Caller Disagree/Comply Comply Caller Understands Yes PreDisposition InappropriateToAsk Care Advice Given Per Guideline CALL PCP WHEN OFFICE IS OPEN: * You need to discuss this with your doctor (or NP/PA) within the next few days. * Call the office when it is open. REASSURANCE AND EDUCATION - SUSPECTED COVID-19: * You suspect you have COVID-19 because you have symptoms that match and you were either exposed to someone with it or because it widespread in your community. * Most people who get  COVID-19 will have mild  illness, can recover at home without medical care, and may not need to be tested. From what you have told me, your symptoms are mild. That is reassuring. * Talk with your healthcare provider about your symptoms. Ask if testing is needed. * Call them during regular office hours. HUMIDIFIER: * If the air is dry, use a humidifier in the bedroom. AVOID TOBACCO SMOKE: * Avoid tobacco smoke. * Smoking or being exposed to smoke makes coughs much worse. PAIN OR FEVER MEDICINES: * For pain and fever relief, take acetaminophen or ibuprofen. * Treat fevers above 101 F (38.3 C). * Before taking any medicine, read all the instructions on the package. * Do not take nonsteroidal anti-inflammatory drugs (NSAIDs) if you have stomach problems, kidney disease, heart failure, or other contraindications to using this type of medicine. CAUTION - NSAIDS (E.G., IBUPROFEN, NAPROXEN): HOW TO PROTECT OTHERS - WHEN YOU ARE SICK WITH COVID-19: * STAY HOME: Stay home from school or work if you are sick. Do NOT go to religious services, child care centers, shopping, or other public places. Do NOT use public transportation (e.g., bus, taxis, ride-sharing). Do NOT allow any visitors to your home. Leave the house only if you need to seek urgent medical care. * COVER THE COUGH: Cough and sneeze into your shirt sleeve or inner elbow. Don't cough into your hand or the air. If available, cough into a tissue and throw it into a trash can. * WASH HANDS OFTEN: Wash hands often with soap and water. After coughing or sneezing are important times. * WEAR A MASK: Wear a facemask when around others. Always wear a facemask (if available) if you have to leave your home (such as going to a medical facility). * CALL FIRST IF MEDICAL CARE NEEDED: Call ahead to get approval and careful directions. STOPPING HOME ISOLATION - MUST MEET ALL 3 REQUIREMENTS (CDC): * Fever gone for at least 72 hours (3 full days) off  fever-reducing medicines AND * Cough and other symptoms must be improved AND * Symptoms started more than 10 days ago. * If unsure if it is safe for you to leave isolation, check the CDC website or call your healthcare provider. CALL BACK IF: * Fever over 103 F (39.4 C) * Fever lasts over 3 days * Fever returns after being gone for 24 hours * Chest pain or difficulty breathing occurs * You become worse. CARE ADVICE given per CORONAVIRUS (COVID-19) - DIAGNOSED OR SUSPECTED (Adult) guideline. After Care Instructions Given Call Event Type User Date / Time Description Education document email Weiss-Hilton, RN, Lillia AbedLindsay 07/19/2019 9:30:21 AM Coronavirus (COVID-19) Diagnosis or Suspected Education document email Jetty PeeksWeiss-Hilton, Mechele DawleyRN, Lindsay 07/19/2019 9:30:21 AM COVID-19 Lowella DandyFactsheet_English Education document email Weiss-Hilton, RN, Lillia AbedLindsay 07/19/2019 9:30:21 AM What To Do If You Are Sick With COVID-19_English PLEASE NOTE: All timestamps contained within this report are represented as [place] Standard Time. CONFIDENTIALTY NOTICE: This fax transmission is intended only for the addressee. It contains information that is legally privileged, confidential or otherwise protected from use or disclosure. If you are not the intended recipient, you are strictly prohibited from reviewing, disclosing, copying using or disseminating any of this information or taking any action in reliance on or regarding this information. If you have received this fax in error, please notify us immediately by telephone so that we can arrange for its return to us. Phone: [phone], Toll-Free: [phone], Fax: [phone] Page: 3 of 3 Call Id: [id number] Comments User: Jovita KussmaulLindsay, Weiss-Hilton, RN Date/Time Lamount Cohen(Eastern Time): 07/19/2019 9:22:06 AM Had  laproscopic surgery on the Wednesday the 8th for cysts removal.

## 2019-07-20 NOTE — Telephone Encounter (Signed)
Please call pt and schedule virtual visit. 

## 2019-07-31 ENCOUNTER — Encounter: Payer: Self-pay | Admitting: Family Medicine

## 2019-07-31 ENCOUNTER — Telehealth: Payer: Self-pay | Admitting: Family Medicine

## 2019-07-31 NOTE — Telephone Encounter (Signed)
Please call pt and schedule virtual visit for next week can not fill out FMLA without visit.

## 2019-07-31 NOTE — Telephone Encounter (Signed)
L/m for pt to call office need to make app as well as get her to have [facility] fax copy of results.

## 2019-07-31 NOTE — Telephone Encounter (Signed)
Pt would like a call back - or you can send her a MyChart message - She had a positive COVID test, which was done at [facility].  [facility] will not fill out FMLA paperwork - they said she has to go through her PCP.  She wants to know if she has to get a second test done, through [name], in order for [name] to fill the paperwork out for her.  She will do the test and a virtual visit if it is necessary.  Please advise.

## 2019-08-04 ENCOUNTER — Ambulatory Visit (INDEPENDENT_AMBULATORY_CARE_PROVIDER_SITE_OTHER): Payer: BC Managed Care – PPO | Admitting: Family Medicine

## 2019-08-04 ENCOUNTER — Encounter: Payer: Self-pay | Admitting: Family Medicine

## 2019-08-04 ENCOUNTER — Other Ambulatory Visit: Payer: Self-pay

## 2019-08-04 VITALS — Ht 66.0 in | Wt 187.0 lb

## 2019-08-04 DIAGNOSIS — U071 COVID-19: Secondary | ICD-10-CM | POA: Diagnosis not present

## 2019-08-04 NOTE — Progress Notes (Signed)
Virtual Visit via Video   Due to the COVID-19 pandemic, this visit was completed with telemedicine (audio/video) technology to reduce patient and provider exposure as well as to preserve personal protective equipment.   I connected with [name] by a video enabled telemedicine application and verified that I am speaking with the correct person using two identifiers. Location patient: Home Location provider: [facility], Office Persons participating in the virtual visit: [name], Briscoe Deutscher, DO Lonell Grandchild, CMA acting as scribe for Dr. Briscoe Deutscher.   I discussed the limitations of evaluation and management by telemedicine and the availability of in person appointments. The patient expressed understanding and agreed to proceed.  Care Team   Patient Care Team: Briscoe Deutscher, DO as PCP - General (Family Medicine) Bjorn Loser, MD as Consulting Physician (Urology)  Subjective:   HPI: [facility] and tested positive for covid on 07/20/19. She returned back to work on 08/03/2019. Not having any symptoms.  Her husband also tested positive.  Both are asymptomatic at this point.  Making sure to social distance, wear mask, and wash hands.  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  She does need paperwork completed for FMLA.  Review of Systems  Constitutional: Negative for chills and fever.  HENT: Negative for hearing loss and tinnitus.   Eyes: Negative for blurred vision and double vision.  Respiratory: Negative for cough and wheezing.   Cardiovascular: Negative for chest pain, palpitations and leg swelling.  Gastrointestinal: Negative for heartburn, nausea and vomiting.  Genitourinary: Negative for dysuria and urgency.  Neurological: Negative for dizziness and headaches.  Psychiatric/Behavioral: Negative for depression and suicidal ideas.    Patient Active Problem List   Diagnosis Date Noted  . Urinary frequency 05/13/2019  . Female cystocele 02/25/2019    . Pelvic floor relaxation, s/p pelvic PT, without improvement 02/25/2019  . Genital herpes simplex, with rare outbreaks 02/25/2019  . IBS (irritable bowel syndrome) 02/25/2019  . Dyspareunia in female 02/25/2019  . Chronic pelvic pain in female 02/25/2019  . Hyperstimulation of ovaries, with Hx of polycystic ovaries on [name] 05/05/2018  . Hypothyroidism, acquired, autoimmune, on Levothyroxine, followed by Endocrinology 11/25/2016  . Migraine, controlled with Topamax 11/01/2016    Social History   Tobacco Use  . Smoking status: Never Smoker  . Smokeless tobacco: Never Used  Substance Use Topics  . Alcohol use: No    Alcohol/week: 0.0 standard drinks    Current Outpatient Medications:  .  dicyclomine (BENTYL) 20 MG tablet, Take 20 mg by mouth every 6 (six) hours., Disp: , Rfl:  .  ibuprofen (ADVIL) 600 MG tablet, Take 1 tablet (600 mg total) by mouth every 6 (six) hours as needed., Disp: 60 tablet, Rfl: 1 .  levothyroxine (SYNTHROID, LEVOTHROID) 25 MCG tablet, Take 1.5 tabs by mouth daily. (must call and make f/u appt with labs for more refills), Disp: 135 tablet, Rfl: 1 .  SRONYX 0.1-20 MG-MCG tablet, Take 1 tablet by mouth daily., Disp: , Rfl: 0 .  topiramate (TOPAMAX) 25 MG tablet, TAKE 1 TABLET BY MOUTH TWICE A DAY, Disp: 180 tablet, Rfl: 0 .  valACYclovir (VALTREX) 1000 MG tablet, Take 1 tablet (1,000 mg total) by mouth 2 (two) times daily., Disp: 6 tablet, Rfl: 2  Allergies  Allergen Reactions  . Erythromycin   . Penicillins Hives    Can tolerate cephalosporins    Objective:   VITALS: Per patient if applicable, see vitals. GENERAL: Alert, appears well and  in no acute distress. HEENT: Atraumatic, conjunctiva clear, no obvious abnormalities on inspection of external nose and ears. NECK: Normal movements of the head and neck. CARDIOPULMONARY: No increased WOB. Speaking in clear sentences. I:E ratio WNL.  MS: Moves all visible extremities without noticeable  abnormality. PSYCH: Pleasant and cooperative, well-groomed. Speech normal rate and rhythm. Affect is appropriate. Insight and judgement are appropriate. Attention is focused, linear, and appropriate.  NEURO: CN grossly intact. Oriented as arrived to appointment on time with no prompting. Moves both UE equally.  SKIN: No obvious lesions, wounds, erythema, or cyanosis noted on face or hands.  [facility] 02/24/2019 03/23/2016  Decreased Interest 1 0  Down, Depressed, Hopeless 2 0  PHQ - 2 Score 3 0  Altered sleeping 3 -  Tired, decreased energy 3 -  Change in appetite 3 -  Feeling bad or failure about yourself  0 -  Trouble concentrating 0 -  Moving slowly or fidgety/restless 0 -  Suicidal thoughts 0 -  PHQ-9 Score 12 -  Difficult doing work/chores Very difficult -    Assessment and Plan:   [name] was seen today for form completion.  Diagnoses and all orders for this visit:  COVID-19 virus infection Comments: Now resolved.  Will complete FMLA paperwork to cover her 2 weeks out of work.    Marland Kitchen. COVID-19 Education: The signs and symptoms of COVID-19 were discussed with the patient and how to seek care for testing if needed. The importance of social distancing was discussed today. . Reviewed expectations re: course of current medical issues. . Discussed self-management of symptoms. . Outlined signs and symptoms indicating need for more acute intervention. . Patient verbalized understanding and all questions were answered. Marland Kitchen. Health Maintenance issues including appropriate healthy diet, exercise, and smoking avoidance were discussed with patient. . See orders for this visit as documented in the electronic medical record.  Helane RimaErica Jassiah Viviano, DO  Records requested if needed. Time spent: 15 minutes, of which >50% was spent in obtaining information about her symptoms, reviewing her previous labs, evaluations, and treatments, counseling her about her condition (please see the  discussed topics above), and developing a plan to further investigate it; she had a number of questions which I addressed.

## 2019-08-19 ENCOUNTER — Ambulatory Visit: Payer: Self-pay | Admitting: *Deleted

## 2019-08-19 ENCOUNTER — Encounter: Payer: Self-pay | Admitting: Physician Assistant

## 2019-08-19 ENCOUNTER — Ambulatory Visit (INDEPENDENT_AMBULATORY_CARE_PROVIDER_SITE_OTHER): Payer: BC Managed Care – PPO | Admitting: Physician Assistant

## 2019-08-19 DIAGNOSIS — G43009 Migraine without aura, not intractable, without status migrainosus: Secondary | ICD-10-CM

## 2019-08-19 DIAGNOSIS — R197 Diarrhea, unspecified: Secondary | ICD-10-CM

## 2019-08-19 MED ORDER — PROMETHAZINE HCL 12.5 MG PO TABS: 12.5000 mg | ORAL_TABLET | Freq: Three times a day (TID) | ORAL | 0 refills | Status: DC | PRN

## 2019-08-19 NOTE — Telephone Encounter (Signed)
FYI

## 2019-08-19 NOTE — Telephone Encounter (Signed)
FYI-see below- 

## 2019-08-19 NOTE — Progress Notes (Signed)
Virtual Visit via Video   I connected with [name] on 08/19/19 at 11:40 AM EDT by a video enabled telemedicine application and verified that I am speaking with the correct person using two identifiers. Location patient: Home Location provider: [facility], Office Persons participating in the virtual visit: Niyati, Heinke PA-C.  I discussed the limitations of evaluation and management by telemedicine and the availability of in person appointments. The patient expressed understanding and agreed to proceed.  I acted as a Education administrator for [employer], [employer], LPN  Subjective:   HPI:   Headache Pt c/o of headache x 3 days, eyes sensitive to light. She has been taking Ibuprofen 600 mg prn with little relief. Pt is also taking Topamax 50 mg daily no relief. States that her HA is 8/10.  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  She has never been on abortive medication for her migraines, they are typically well controlled with Topamax.  She is drinking fluids but she does feel like she can drink more.  Denies slurred speech, blurred vision, weakness on one side of the body, chest pain, shortness of breath.  Diarrhea Pt started with diarrhea this morning and has gone 4 times before 9:00 am, none since. Also having nausea. Pt is drinking water.  She states that is not actually unusual for her to have diarrhea (has IBS-D) and she is not actually concerned about this at this time.  However she is not really drinking any electrolytes at this time.  She is eating normal foods and denies any suspicious food intake.  No LMP recorded. (Menstrual status: Oral contraceptives).   ROS: See pertinent positives and negatives per HPI.  Patient Active Problem List   Diagnosis Date Noted   . Urinary frequency 05/13/2019  . Female cystocele 02/25/2019  . Pelvic floor relaxation, s/p pelvic PT, without improvement 02/25/2019  . Genital herpes simplex, with rare outbreaks 02/25/2019  . IBS (irritable bowel syndrome) 02/25/2019  . Dyspareunia in female 02/25/2019  . Chronic pelvic pain in female 02/25/2019  . Hyperstimulation of ovaries, with Hx of polycystic ovaries on [name] 05/05/2018  . Hypothyroidism, acquired, autoimmune, on Levothyroxine, followed by Endocrinology 11/25/2016  . Migraine, controlled with Topamax 11/01/2016    Social History   Tobacco Use  . Smoking status: Never Smoker  . Smokeless tobacco: Never Used  Substance Use Topics  . Alcohol use: No    Alcohol/week: 0.0 standard drinks    Current Outpatient Medications:  .  dicyclomine (BENTYL) 20 MG tablet, Take 20 mg by mouth every 6 (six) hours., Disp: , Rfl:  .  ibuprofen (ADVIL) 600 MG tablet, Take 1 tablet (600 mg total) by mouth every 6 (six) hours as needed., Disp: 60 tablet, Rfl: 1 .  levothyroxine (SYNTHROID, LEVOTHROID) 25 MCG tablet, Take 1.5 tabs by mouth daily. (must call and make f/u appt with labs for more refills), Disp: 135 tablet, Rfl: 1 .  SRONYX 0.1-20 MG-MCG tablet, Take 1 tablet by mouth daily., Disp: , Rfl: 0 .  topiramate (TOPAMAX) 25 MG tablet, TAKE 1 TABLET BY MOUTH TWICE A DAY, Disp: 180 tablet, Rfl: 0 .  valACYclovir (VALTREX) 1000 MG tablet, Take 1 tablet (1,000 mg total) by mouth 2 (two) times  daily. (Patient taking differently: Take 1,000 mg by mouth 2 (two) times daily as needed. ), Disp: 6 tablet, Rfl: 2 .  promethazine (PHENERGAN) 12.5 MG tablet, Take 1 tablet (12.5 mg total) by mouth every 8 (eight) hours as needed for nausea or vomiting., Disp: 20 tablet, Rfl: 0  Allergies  Allergen Reactions  . Erythromycin   . Penicillins Hives    Can tolerate cephalosporins    Objective:   VITALS: Per patient if applicable, see vitals. GENERAL: Alert, appears well and in no acute  distress. HEENT: Atraumatic, conjunctiva clear, no obvious abnormalities on inspection of external nose and ears. NECK: Normal movements of the head and neck. CARDIOPULMONARY: No increased WOB. Speaking in clear sentences. I:E ratio WNL.  MS: Moves all visible extremities without noticeable abnormality. PSYCH: Pleasant and cooperative, well-groomed. Speech normal rate and rhythm. Affect is appropriate. Insight and judgement are appropriate. Attention is focused, linear, and appropriate.  NEURO: CN grossly intact. Oriented as arrived to appointment on time with no prompting. Moves both UE equally.  SKIN: No obvious lesions, wounds, erythema, or cyanosis noted on face or hands.  Assessment and Plan:   Diagnoses and all orders for this visit:  Migraine without aura and without status migrainosus, not intractable No red flags on discussion with her. Suspect that this is from stress and possibly a little bit of dehydration, as well as possible post-COVID symptom?  I recommended Phenergan, adding in some electrolytes with oral rehydration solutions, and Excedrin Migraine.  Writing her out of work for today and tomorrow.  Red flags reviewed and worsening precautions advised.  Diarrhea, unspecified type I recommended pushing fluids making sure she staying hydrated with some electrolytes, worsening precautions advised. No red flags, she feels like this is part of her IBS-D.  Other orders -     promethazine (PHENERGAN) 12.5 MG tablet; Take 1 tablet (12.5 mg total) by mouth every 8 (eight) hours as needed for nausea or vomiting.  . Reviewed expectations re: course of current medical issues. . Discussed self-management of symptoms. . Outlined signs and symptoms indicating need for more acute intervention. . Patient verbalized understanding and all questions were answered. Marland Kitchen. Health Maintenance issues including appropriate healthy diet, exercise, and smoking avoidance were discussed with patient. . See  orders for this visit as documented in the electronic medical record.  I discussed the assessment and treatment plan with the patient. The patient was provided an opportunity to ask questions and all were answered. The patient agreed with the plan and demonstrated an understanding of the instructions.   The patient was advised to call back or seek an in-person evaluation if the symptoms worsen or if the condition fails to improve as anticipated.   CMA or LPN served as scribe during this visit. History, Physical, and Plan performed by medical provider. The above documentation has been reviewed and is accurate and complete.   Cottage GroveSamantha Kanon Novosel, GeorgiaPA 08/19/2019

## 2019-08-19 NOTE — Telephone Encounter (Signed)
   Reason for Disposition . MILD difficulty breathing (e.g., minimal/no SOB at rest, SOB with walking, pulse <100)  Answer Assessment - Initial Assessment Questions 1. COVID-19 DIAGNOSIS: "Who made your Coronavirus (COVID-19) diagnosis?" "Was it confirmed by a positive lab test?" If not diagnosed by a HCP, ask "Are there lots of cases (community spread) where you live?" (See public health department website, if unsure)     Tested positive -07/20/2019 2. ONSET: "When did the COVID-19 symptoms start?"      07/18/2019, improved and returned to work 08/03/2019 3. WORST SYMPTOM: "What is your worst symptom?" (e.g., cough, fever, shortness of breath, muscle aches)    Initially Fever and chills.  Now headache   4. COUGH: "Do you have a cough?" If so, ask: "How bad is the cough?"       No cough 5. FEVER: "Do you have a fever?" If so, ask: "What is your temperature, how was it measured, and when did it start?"     No Fever currently 6. RESPIRATORY STATUS: "Describe your breathing?" (e.g., shortness of breath, wheezing, unable to speak)      Slightly short of breath with daily activities  7. BETTER-SAME-WORSE: "Are you getting better, staying the same or getting worse compared to yesterday?"  If getting worse, ask, "In what way?"    Worse - headache worse today 8. HIGH RISK DISEASE: "Do you have any chronic medical problems?" (e.g., asthma, heart or lung disease, weak immune system, etc.)     Denies chronic medical problems  9. PREGNANCY: "Is there any chance you are pregnant?" "When was your last menstrual period?" Not pregnant      LMP 08/12/2019 10. OTHER SYMPTOMS: "Do you have any other symptoms?"  (e.g., chills, fatigue, headache, loss of smell or taste, muscle pain, sore throat)       Diarrhea, mild shortness of breath  Protocols used: CORONAVIRUS (COVID-19) DIAGNOSED OR SUSPECTED-A-AH  Patient tested positive for COVID 19 on 07/20/2019.  Symptoms were fever, chills, headache, fatigue.  She  recovered and returned to work 08/03/2019.  She had been feeling better until this week.  She began having a headache and diarrhea yesterday.  Headache worsened today and she had to call in sick to work.  She feels congested and has slight shortness of breath with normal daily activities.  Transferred call to PCP office for patient to schedule a virtual visit today due to slight shortness of breath.  She is talking normally, but does sound congested, she denies acute distress.  Patient scheduled today at 11:40 am.

## 2019-10-08 ENCOUNTER — Other Ambulatory Visit: Payer: Self-pay | Admitting: Endocrinology

## 2019-10-12 ENCOUNTER — Other Ambulatory Visit: Payer: Self-pay | Admitting: Endocrinology

## 2019-10-12 ENCOUNTER — Other Ambulatory Visit: Payer: Self-pay

## 2019-10-12 ENCOUNTER — Other Ambulatory Visit (INDEPENDENT_AMBULATORY_CARE_PROVIDER_SITE_OTHER): Payer: BC Managed Care – PPO

## 2019-10-12 DIAGNOSIS — E063 Autoimmune thyroiditis: Secondary | ICD-10-CM

## 2019-10-12 LAB — TSH: TSH: 3.29 u[IU]/mL (ref 0.35–4.50)

## 2019-10-12 LAB — T4, FREE: Free T4: 0.7 ng/dL (ref 0.60–1.60)

## 2019-10-12 LAB — T3, FREE: T3, Free: 3.4 pg/mL (ref 2.3–4.2)

## 2019-10-13 ENCOUNTER — Other Ambulatory Visit: Payer: Self-pay | Admitting: Family Medicine

## 2019-10-13 ENCOUNTER — Encounter: Payer: Self-pay | Admitting: Family Medicine

## 2019-10-13 DIAGNOSIS — G43009 Migraine without aura, not intractable, without status migrainosus: Secondary | ICD-10-CM

## 2019-10-15 ENCOUNTER — Other Ambulatory Visit: Payer: Self-pay

## 2019-10-15 ENCOUNTER — Encounter: Payer: Self-pay | Admitting: Endocrinology

## 2019-10-15 ENCOUNTER — Ambulatory Visit (INDEPENDENT_AMBULATORY_CARE_PROVIDER_SITE_OTHER): Payer: BC Managed Care – PPO | Admitting: Endocrinology

## 2019-10-15 DIAGNOSIS — E063 Autoimmune thyroiditis: Secondary | ICD-10-CM | POA: Diagnosis not present

## 2019-10-15 MED ORDER — SRONYX 0.1-20 MG-MCG PO TABS: 1.0000 | ORAL_TABLET | Freq: Every day | ORAL | 0 refills | Status: DC

## 2019-10-15 NOTE — Telephone Encounter (Signed)
See note, contact patient with an update  Copied from [place] [phone]. Topic: General - Inquiry >> Oct 15, 2019  1:32 PM [name] Luz wrote: Patient calling to check status of request sent through mychart on 10/13 regarding birth control.

## 2019-10-15 NOTE — Progress Notes (Signed)
Patient ID: [name], female   DOB: [birth year], 30 y.o.   MRN: [id number]             Reason for Appointment:  Hypothyroidism, follow-up visit   Today's office visit was provided via telemedicine using video technique Explained to the patient and the the limitations of evaluation and management by telemedicine and the availability of in person appointments.  The patient understood the limitations and agreed to proceed. Patient also understood that the telehealth visit is billable. . Location of the patient: Home . Location of the provider: Office Only the patient and myself were participating in the encounter      History of Present Illness:   Hypothyroidism was first diagnosed in 9/17  Patient has had symptoms of fatigue for about 4 years.  This had been fairly consistent and not significantly more recently She also has had an overall weight gain of about 40 pounds although this has been variable and at one point about 2 years ago had lost about 20 pounds with diet and exercise She has had some tendency to hair loss over the last year along with difficulty concentrating and memory Does have mild cold sensitivity but more in her feet  In 9/17 her gynecologist checked her TSH and it was about 6.  RECENT HISTORY: She has been on levothyroxine daily since 11/17, initially 25 g and subsequently higher doses  Initially with starting the levothyroxine she felt a little better with energy level and also had more motivation and better focusing.   When TSH was high normal  her dose was increased to 37.5 g in February 2018  Again she thinks she feels fairly good and does not get unusually tired  Her weight is variable and she thinks it is back up to 193 now No recurrence of hair loss, cold intolerance She has had some nonspecific back pain  She is consistent with taking her levothyroxine around 6 AM daily with water only  Her TSH done in February was normal but is now high  normal at 3.3  Patient's weight history is as follows:  Wt Readings from Last 3 Encounters:  08/04/19 187 lb (84.8 kg)  07/08/19 187 lb 13.3 oz (85.2 kg)  05/18/19 193 lb 6.4 oz (87.7 kg)    Thyroid function results have been as follows:  Labs from gynecologist done on 10/16/17 showed TSH 2.5, this was reviewed on patient's online record  TSH at baseline done in 9/17 from outside lab was 6  Lab Results  Component Value Date   TSH 3.29 10/12/2019   TSH 1.35 02/24/2019   TSH 1.57 04/22/2018   FREET4 0.70 10/12/2019   FREET4 0.83 02/24/2019   FREET4 0.75 04/22/2018      Past Medical History:  Diagnosis Date  . Headache    Migraines  . History of chicken pox   . Hypothyroidism   . SVD (spontaneous vaginal delivery) 05/09/2012  . Urinary tract infection   . Vertigo     Past Surgical History:  Procedure Laterality Date  . LAPAROSCOPIC OVARIAN CYSTECTOMY Bilateral 07/08/2019   Procedure: LAPAROSCOPIC OVARIAN CYSTECTOMY with peritoneal biopsies;  Surgeon: Allie Bossier, MD;  Location: [facility];  Service: Gynecology;  Laterality: Bilateral;  . TONSILLECTOMY     adenoidectomy  . WISDOM TOOTH EXTRACTION      Family History  Problem Relation Age of Onset  . Diabetes Maternal Grandmother   . Breast cancer Maternal Grandmother   . Parkinson's disease  Maternal Grandmother   . Thyroid disease Father   . Kidney disease Paternal Grandmother   . Thyroid disease Paternal Grandmother   . Healthy Brother   . Healthy Daughter   . Anesthesia problems Neg Hx     Social History:  reports that she has never smoked. She has never used smokeless tobacco. She reports that she does not drink alcohol or use drugs.  Allergies:  Allergies  Allergen Reactions  . Erythromycin   . Penicillins Hives    Can tolerate cephalosporins    Allergies as of 10/15/2019      Reactions   Erythromycin    Penicillins Hives   Can tolerate cephalosporins      Medication List        Accurate as of October 15, 2019  8:25 AM. If you have any questions, ask your nurse or doctor.        dicyclomine 20 MG tablet Commonly known as: BENTYL Take 20 mg by mouth every 6 (six) hours.   ibuprofen 600 MG tablet Commonly known as: ADVIL Take 1 tablet (600 mg total) by mouth every 6 (six) hours as needed.   levothyroxine 25 MCG tablet Commonly known as: SYNTHROID TAKE 1.5 TABS BY MOUTH DAILY. (MUST CALL AND MAKE F/U APPT WITH LABS FOR MORE REFILLS)   promethazine 12.5 MG tablet Commonly known as: PHENERGAN Take 1 tablet (12.5 mg total) by mouth every 8 (eight) hours as needed for nausea or vomiting.   Sronyx 0.1-20 MG-MCG tablet Generic drug: levonorgestrel-ethinyl estradiol Take 1 tablet by mouth daily.   topiramate 25 MG tablet Commonly known as: TOPAMAX TAKE 1 TABLET BY MOUTH TWICE A DAY   valACYclovir 1000 MG tablet Commonly known as: VALTREX Take 1 tablet (1,000 mg total) by mouth 2 (two) times daily. What changed:   when to take this  reasons to take this        Review of Systems  She has polycystic ovaries, on birth control pills She did have laparoscopic cyst removal in July         Examination:    There were no vitals taken for this visit.   No exam done, patient is on a virtual visit   Assessment:  HYPOTHYROIDISM with baseline TSH of 6, mildly symptomatic   She had improved symptomatically with thyroid supplementation Currently with her TSH being high normal at 3.3 she does not think she is feeling any difference However having some difficulty with getting her weight down She is regular with her supplement  She does want to try a higher dose of her levothyroxine since she is currently only on a relatively small dose and not clear if she has unrecognized symptoms  Polycystic ovarian disease: Continues to be on BCP from gynecologist  PLAN:   She will try 2 tablets of 25 mcg for at least 1 month If she is not finding any  difference she can go back to her 37.5 mcg dose  Follow-up in 3 months [name] 10/15/2019, 8:25 AM    Note: This office note was prepared with Dragon voice recognition system technology. Any transcriptional errors that result from this process are unintentional.

## 2019-11-03 ENCOUNTER — Other Ambulatory Visit: Payer: Self-pay | Admitting: Endocrinology

## 2019-11-05 ENCOUNTER — Telehealth: Payer: Self-pay | Admitting: Family Medicine

## 2019-11-05 NOTE — Telephone Encounter (Signed)
Call to make [facility] app and send back to me

## 2019-11-05 NOTE — Telephone Encounter (Signed)
Called pt to schedule, No answer, LVM.  °

## 2019-11-06 NOTE — Telephone Encounter (Signed)
Patient is calling to ask if she can get a refill until she can get a TOC.  Patient said that Dr. Juleen China did refill for one month. Can she get one more month. CB- [phone]

## 2019-11-06 NOTE — Telephone Encounter (Signed)
See request °

## 2019-11-06 NOTE — Telephone Encounter (Signed)
Patient is setting up TOC with another office. Do I need to send refill request to that provider or someone here?

## 2019-11-06 NOTE — Telephone Encounter (Signed)
Patient is calling to do a TOC to Tor Netters at [facility]. CB- [phone]

## 2019-11-06 NOTE — Telephone Encounter (Signed)
See note

## 2019-11-09 ENCOUNTER — Telehealth: Payer: Self-pay | Admitting: Radiology

## 2019-11-09 NOTE — Telephone Encounter (Signed)
I would handle it like any other TOC patient. Can we fill  1 time until patient is able to see new provider?

## 2019-11-09 NOTE — Telephone Encounter (Signed)
Left message to call [id number] to schedule a postop appointment with Dr Hulan Fray. Patient had surgery 07/08/19

## 2019-11-11 NOTE — Telephone Encounter (Signed)
Yes

## 2019-11-23 ENCOUNTER — Other Ambulatory Visit: Payer: Self-pay

## 2019-11-23 ENCOUNTER — Ambulatory Visit: Payer: BC Managed Care – PPO | Admitting: Family Medicine

## 2019-11-23 ENCOUNTER — Encounter: Payer: Self-pay | Admitting: Family Medicine

## 2019-11-23 ENCOUNTER — Ambulatory Visit
Admission: EM | Admit: 2019-11-23 | Discharge: 2019-11-23 | Disposition: A | Discharge status: Home / Self Care | Payer: BC Managed Care – PPO | Attending: Emergency Medicine | Admitting: Emergency Medicine

## 2019-11-23 ENCOUNTER — Encounter: Payer: Self-pay | Admitting: Emergency Medicine

## 2019-11-23 VITALS — Temp 97.8°F | Ht 66.0 in | Wt 195.0 lb

## 2019-11-23 DIAGNOSIS — J029 Acute pharyngitis, unspecified: Secondary | ICD-10-CM

## 2019-11-23 DIAGNOSIS — J020 Streptococcal pharyngitis: Secondary | ICD-10-CM | POA: Diagnosis not present

## 2019-11-23 LAB — POCT RAPID STREP A (OFFICE): Rapid Strep A Screen: NEGATIVE

## 2019-11-23 NOTE — Telephone Encounter (Signed)
Spoke with pt she stated she did get her rx Scheduled her a transfer of care appointment  12/16

## 2019-11-23 NOTE — ED Provider Notes (Signed)
[name]    CSN: [id number] Arrival date & time: 11/23/19  1715      History   Chief Complaint Chief Complaint  Patient presents with  . Sore Throat    HPI [name] is a 28 y.o. female. Patient presents with sore throat x 3 days.  She also reports nonproductive cough.  She denies fever, chills, rash, shortness of breath, or other symptoms.  No treatments attempted at home.  Patient reports she was COVID positive in July.  The history is provided by the patient.    Past Medical History:  Diagnosis Date  . Headache    Migraines  . History of chicken pox   . Hypothyroidism   . SVD (spontaneous vaginal delivery) 05/09/2012  . Urinary tract infection   . Vertigo     Patient Active Problem List   Diagnosis Date Noted  . Urinary frequency 05/13/2019  . Female cystocele 02/25/2019  . Pelvic floor relaxation, s/p pelvic PT, without improvement 02/25/2019  . Genital herpes simplex, with rare outbreaks 02/25/2019  . IBS (irritable bowel syndrome) 02/25/2019  . Dyspareunia in female 02/25/2019  . Chronic pelvic pain in female 02/25/2019  . Hyperstimulation of ovaries, with Hx of polycystic ovaries on [name] 05/05/2018  . Hypothyroidism, acquired, autoimmune, on Levothyroxine, followed by Endocrinology 11/25/2016  . Migraine, controlled with Topamax 11/01/2016    Past Surgical History:  Procedure Laterality Date  . LAPAROSCOPIC OVARIAN CYSTECTOMY Bilateral 07/08/2019   Procedure: LAPAROSCOPIC OVARIAN CYSTECTOMY with peritoneal biopsies;  Surgeon: Emily Filbert, MD;  Location: [facility];  Service: Gynecology;  Laterality: Bilateral;  . TONSILLECTOMY     adenoidectomy  . WISDOM TOOTH EXTRACTION      OB History    Gravida  1   Para  1   Term  1   Preterm  0   AB  0   Living  1     SAB  0   TAB  0   Ectopic  0   Multiple  0   Live Births  1            Home Medications    Prior to Admission medications   Medication Sig  Start Date End Date Taking? Authorizing Provider  ibuprofen (ADVIL) 600 MG tablet Take 1 tablet (600 mg total) by mouth every 6 (six) hours as needed. 07/08/19  Yes Clinard, Myra C, MD  topiramate (TOPAMAX) 25 MG tablet TAKE 1 TABLET BY MOUTH TWICE A DAY 10/14/19  Yes Briscoe Deutscher, DO  valACYclovir (VALTREX) 1000 MG tablet Take 1 tablet (1,000 mg total) by mouth 2 (two) times daily. 07/01/19  Yes Briscoe Deutscher, DO  dicyclomine (BENTYL) 20 MG tablet Take 20 mg by mouth every 6 (six) hours.    [provider]  levothyroxine (SYNTHROID) 25 MCG tablet Take 2 tablets by mouth once daily. 11/03/19   Elayne Snare, MD  Humphrey 0.1-20 MG-MCG tablet TAKE 1 TABLET BY MOUTH EVERY DAY 11/09/19   Briscoe Deutscher, DO    Family History Family History  Problem Relation Age of Onset  . Diabetes Maternal Grandmother   . Breast cancer Maternal Grandmother   . Parkinson's disease Maternal Grandmother   . Thyroid disease Father   . Kidney disease Paternal Grandmother   . Thyroid disease Paternal Grandmother   . Healthy Brother   . Healthy Daughter   . Anesthesia problems Neg Hx     Social History Social History   Tobacco Use  .  Smoking status: Never Smoker  . Smokeless tobacco: Never Used  Substance Use Topics  . Alcohol use: No    Alcohol/week: 0.0 standard drinks  . Drug use: No     Allergies   Erythromycin and Penicillins   Review of Systems Review of Systems  Constitutional: Negative for chills and fever.  HENT: Positive for sore throat. Negative for ear pain and trouble swallowing.   Eyes: Negative for pain and visual disturbance.  Respiratory: Positive for cough. Negative for shortness of breath.   Cardiovascular: Negative for chest pain and palpitations.  Gastrointestinal: Negative for abdominal pain and vomiting.  Genitourinary: Negative for dysuria and hematuria.  Musculoskeletal: Negative for arthralgias and back pain.  Skin: Negative for color change and rash.  Neurological:  Negative for seizures and syncope.  All other systems reviewed and are negative.    Physical Exam Triage Vital Signs ED Triage Vitals  Enc Vitals Group     BP 11/23/19 1727 117/82     Pulse Rate 11/23/19 1727 74     Resp 11/23/19 1727 18     Temp 11/23/19 1727 98 F (36.7 C)     Temp Source 11/23/19 1727 Oral     SpO2 11/23/19 1727 98 %     Weight 11/23/19 1729 195 lb (88.5 kg)     Height --      Head Circumference --      Peak Flow --      Pain Score 11/23/19 1729 3     Pain Loc --      Pain Edu? --      Excl. in GC? --    No data found.  Updated Vital Signs BP 117/82 (BP Location: Left Arm)   Pulse 74   Temp 98 F (36.7 C) (Oral)   Resp 18   Wt 195 lb (88.5 kg)   LMP 08/01/2019   SpO2 98%   BMI 31.47 kg/m   Visual Acuity Right Eye Distance:   Left Eye Distance:   Bilateral Distance:    Right Eye Near:   Left Eye Near:    Bilateral Near:     Physical Exam Vitals signs and nursing note reviewed.  Constitutional:      General: She is not in acute distress.    Appearance: She is well-developed.  HENT:     Head: Normocephalic and atraumatic.     Right Ear: Tympanic membrane normal.     Left Ear: Tympanic membrane normal.     Nose: Nose normal.     Mouth/Throat:     Mouth: Mucous membranes are moist.     Pharynx: Oropharynx is clear.     Comments: Able to speak and swallow without difficulty.   Eyes:     Conjunctiva/sclera: Conjunctivae normal.  Neck:     Musculoskeletal: Neck supple.  Cardiovascular:     Rate and Rhythm: Normal rate and regular rhythm.     Heart sounds: No murmur.  Pulmonary:     Effort: Pulmonary effort is normal. No respiratory distress.     Breath sounds: Normal breath sounds.  Abdominal:     General: Bowel sounds are normal.     Palpations: Abdomen is soft.     Tenderness: There is no abdominal tenderness. There is no guarding or rebound.  Skin:    General: Skin is warm and dry.     Findings: No rash.  Neurological:      General: No focal deficit present.     Mental  Status: She is alert and oriented to person, place, and time.      UC Treatments / Results  Labs (all labs ordered are listed, but only abnormal results are displayed) Labs Reviewed  POCT RAPID STREP A (OFFICE) - Normal  NOVEL CORONAVIRUS, NAA  CULTURE, GROUP A STREP [facility])    EKG   Radiology No results found.  Procedures Procedures (including critical care time)  Medications Ordered in UC Medications - No data to display  Initial Impression / Assessment and Plan / UC Course  I have reviewed the triage vital signs and the nursing notes.  Pertinent labs & imaging results that were available during my care of the patient were reviewed by me and considered in my medical decision making (see chart for details).    Sore throat.  Rapid strep negative; throat culture pending.  COVID test performed here.  Instructed patient to self quarantine until her test result is back.  Instructed her to go to the ED if she has high fever, shortness of breath, severe diarrhea, or other concerning symptoms.  Patient agrees to plan of care.     Final Clinical Impressions(s) / UC Diagnoses   Final diagnoses:  Sore throat     Discharge Instructions     Your rapid strep test is negative.  A throat culture is pending; we will call you if it is positive requiring treatment.    Your COVID test is pending.  You should self quarantine until your test result is back and is negative.    Go to the emergency department if you develop high fever, shortness of breath, severe diarrhea, or other concerning symptoms.       ED Prescriptions    None     PDMP not reviewed this encounter.   Mickie Bail, NP 11/23/19 1759

## 2019-11-23 NOTE — Discharge Instructions (Addendum)
Your rapid strep test is negative.  A throat culture is pending; we will call you if it is positive requiring treatment.    Your COVID test is pending.  You should self quarantine until your test result is back and is negative.    Go to the emergency department if you develop high fever, shortness of breath, severe diarrhea, or other concerning symptoms.    

## 2019-11-23 NOTE — Telephone Encounter (Signed)
This message did not get sent to Clarene Reamer to be approved, sending this now. Patient states her husband and daughter see Jackelyn Poling already. Please review. Thank you

## 2019-11-23 NOTE — Telephone Encounter (Signed)
Please call patient to schedule. Make sure she got her needed med? I can't tell from message what she needed a refill for.

## 2019-11-23 NOTE — ED Triage Notes (Signed)
Patient in office today c/o sore throat difficult to swallow w/pain    Had virtual visit and was informed to be tested for strep   FQM:[id number]

## 2019-11-23 NOTE — Telephone Encounter (Signed)
Noted  

## 2019-11-23 NOTE — Progress Notes (Signed)
I connected with [name] on 11/23/19 at  3:00 PM EST by video and verified that I am speaking with the correct person using two identifiers.   I discussed the limitations, risks, security and privacy concerns of performing an evaluation and management service by video and the availability of in person appointments. I also discussed with the patient that there may be a patient responsible charge related to this service. The patient expressed understanding and agreed to proceed.  Patient location: at work Provider Location: [facility] Participants: [name] and [name]   Subjective:     [name] is a 28 y.o. female presenting for Sore Throat (x saturday, feels light a lump in throat, hard swallow ), Chest Pain (in chest, having to take a deep breath, Denies fever), Dizziness, Cough (slight cough ), and Headache     Sore Throat  This is a new problem. The current episode started in the past 7 days. The problem has been gradually worsening. There has been no fever. Associated symptoms include coughing, ear pain, headaches and trouble swallowing. Pertinent negatives include no congestion.  Chest Pain  Associated symptoms include a cough, dizziness, headaches and nausea. Pertinent negatives include no fever.  Dizziness Associated symptoms include arthralgias, chest pain, coughing, fatigue, headaches, myalgias and nausea. Pertinent negatives include no chills, congestion or fever.  Cough Associated symptoms include chest pain, ear pain, headaches and myalgias. Pertinent negatives include no chills or fever.  Headache  Associated symptoms include coughing, dizziness, ear pain and nausea. Pertinent negatives include no fever.   Hx of strept throat Previously had COVID Sick contact - cowork was sick but not covid  Review of Systems  Constitutional: Positive for fatigue. Negative for chills and fever.  HENT: Positive for ear pain and trouble swallowing.  Negative for congestion.   Respiratory: Positive for cough.   Cardiovascular: Positive for chest pain.  Gastrointestinal: Positive for nausea.  Musculoskeletal: Positive for arthralgias and myalgias.  Neurological: Positive for dizziness and headaches.     Social History   Tobacco Use  Smoking Status Never Smoker  Smokeless Tobacco Never Used        Objective:   BP Readings from Last 3 Encounters:  07/08/19 110/78  05/18/19 131/82  02/24/19 120/88   Wt Readings from Last 3 Encounters:  11/23/19 195 lb (88.5 kg)  08/04/19 187 lb (84.8 kg)  07/08/19 187 lb 13.3 oz (85.2 kg)    Temp 97.8 F (36.6 C)    Ht 5\' 6"  (1.676 m)    Wt 195 lb (88.5 kg)    BMI 31.47 kg/m    Physical Exam Constitutional:      Appearance: Normal appearance. She is not ill-appearing.  HENT:     Head: Normocephalic and atraumatic.     Right Ear: External ear normal.     Left Ear: External ear normal.     Mouth/Throat:     Mouth: Mucous membranes are moist.     Comments: Unable to see oropharynx due to video Eyes:     Conjunctiva/sclera: Conjunctivae normal.  Pulmonary:     Effort: Pulmonary effort is normal. No respiratory distress.  Neurological:     Mental Status: She is alert. Mental status is at baseline.  Psychiatric:        Mood and Affect: Mood normal.        Behavior: Behavior normal.        Thought Content: Thought content normal.  Judgment: Judgment normal.            Assessment & Plan:   Problem List Items Addressed This Visit    None    Visit Diagnoses    Sore throat    -  Primary   Relevant Orders   Rapid Strep A     Discussed symptomatic care Advised testing for Strept and directed to urgent care Will f/u test and treat based on results  Reviewed chart - pt went to urgent care but was seen by provider. Strep negative. Covid sent and advised to follow-up.   Return if symptoms worsen or fail to improve.  Lesleigh Noe, MD

## 2019-11-24 ENCOUNTER — Ambulatory Visit: Payer: BC Managed Care – PPO | Admitting: Obstetrics and Gynecology

## 2019-11-24 NOTE — Addendum Note (Signed)
Addended by: Lesleigh Noe on: 11/24/2019 08:39 AM   Modules accepted: Level of Service

## 2019-11-25 LAB — NOVEL CORONAVIRUS, NAA: SARS-CoV-2, NAA: NOT DETECTED

## 2019-11-26 LAB — CULTURE, GROUP A STREP (THRC)

## 2019-12-03 ENCOUNTER — Encounter (INDEPENDENT_AMBULATORY_CARE_PROVIDER_SITE_OTHER): Payer: Self-pay | Admitting: Family Medicine

## 2019-12-03 NOTE — Telephone Encounter (Signed)
Please review

## 2019-12-07 ENCOUNTER — Other Ambulatory Visit: Payer: Self-pay

## 2019-12-07 MED ORDER — SRONYX 0.1-20 MG-MCG PO TABS: 1.0000 | ORAL_TABLET | Freq: Every day | ORAL | 0 refills | Status: DC

## 2019-12-10 ENCOUNTER — Other Ambulatory Visit: Payer: Self-pay

## 2019-12-10 ENCOUNTER — Ambulatory Visit (INDEPENDENT_AMBULATORY_CARE_PROVIDER_SITE_OTHER): Payer: BC Managed Care – PPO | Admitting: Obstetrics and Gynecology

## 2019-12-10 ENCOUNTER — Encounter: Payer: Self-pay | Admitting: Obstetrics and Gynecology

## 2019-12-10 VITALS — BP 139/87 | HR 81 | Ht 66.0 in | Wt 198.0 lb

## 2019-12-10 DIAGNOSIS — B9689 Other specified bacterial agents as the cause of diseases classified elsewhere: Secondary | ICD-10-CM

## 2019-12-10 DIAGNOSIS — Z01419 Encounter for gynecological examination (general) (routine) without abnormal findings: Secondary | ICD-10-CM | POA: Diagnosis not present

## 2019-12-10 DIAGNOSIS — Z124 Encounter for screening for malignant neoplasm of cervix: Secondary | ICD-10-CM

## 2019-12-10 DIAGNOSIS — N898 Other specified noninflammatory disorders of vagina: Secondary | ICD-10-CM | POA: Diagnosis not present

## 2019-12-10 DIAGNOSIS — N760 Acute vaginitis: Secondary | ICD-10-CM

## 2019-12-10 DIAGNOSIS — Z113 Encounter for screening for infections with a predominantly sexual mode of transmission: Secondary | ICD-10-CM | POA: Diagnosis not present

## 2019-12-10 DIAGNOSIS — Z1151 Encounter for screening for human papillomavirus (HPV): Secondary | ICD-10-CM | POA: Diagnosis not present

## 2019-12-10 DIAGNOSIS — R140 Abdominal distension (gaseous): Secondary | ICD-10-CM

## 2019-12-10 DIAGNOSIS — R1010 Upper abdominal pain, unspecified: Secondary | ICD-10-CM | POA: Diagnosis not present

## 2019-12-10 MED ORDER — FAMOTIDINE 10 MG PO TABS: 10.0000 mg | ORAL_TABLET | Freq: Two times a day (BID) | ORAL | 1 refills | Status: DC

## 2019-12-10 MED ORDER — SRONYX 0.1-20 MG-MCG PO TABS: 1.0000 | ORAL_TABLET | Freq: Every day | ORAL | 3 refills | Status: DC

## 2019-12-10 NOTE — Progress Notes (Signed)
Obstetrics and Gynecology Annual Patient Evaluation  Appointment Date: 12/10/2019  OBGYN Clinic: [facility]  Primary Care Provider: Velora Leach [facility]  Chief Complaint:  Chief Complaint  Patient presents with  . Gynecologic Exam    History of Present Illness: [name] is a 28 y.o. Caucasian G1P1001 (No LMP recorded. (Menstrual status: Oral contraceptives).), seen for the above chief complaint.   Patient most recently followed by Dr. Hulan Leach for history of ovarian cysts and underwent July 2020 laparoscopic bilateral ovarian cystectomies and peritoneal biopsies that showed follicular cysts and negative peritoneum with normal anatomy.  Prior to this she was followed by another GYN in [place], as well as endocrinology, Primary care.  The only OCP that works for her is the [name] (EE-levonorgesterol), which decreases the abdominal bloating, nausea and hot flashes; she continues to take it currently and doesn't have a period with it; she last stopped it around the time of her surgery and had an increase in s/s.   In the past she's tried pelvic floor PT and had about 6-8 sessions but with no change in s/s. She was seen by a GI in the past that recommended she have a hysterectomy due to prolapse and her s/s.   For the past 3-4wks, she's had upper abdominal discomfort that feels like when the baby was punching when she was pregnant. It doesn't sound like it has any concurrence with PO intake.    Review of Systems: as noted in the History of Present Illness.  Patient Active Problem List   Diagnosis Date Noted  . Urinary frequency 05/13/2019  . Female cystocele 02/25/2019  . Pelvic floor relaxation, s/p pelvic PT, without improvement 02/25/2019  . Genital herpes simplex, with rare outbreaks 02/25/2019  . IBS (irritable bowel syndrome) 02/25/2019  . Dyspareunia in female 02/25/2019  . Chronic pelvic pain in female 02/25/2019  . Hyperstimulation of ovaries, with  Hx of polycystic ovaries on [name] 05/05/2018  . Hypothyroidism, acquired, autoimmune, on Levothyroxine, followed by Endocrinology 11/25/2016  . Migraine, controlled with Topamax 11/01/2016     Past Medical History:  Past Medical History:  Diagnosis Date  . Headache    Migraines  . History of chicken pox   . Hypothyroidism   . Urinary tract infection   . Vertigo     Past Surgical History:  Past Surgical History:  Procedure Laterality Date  . LAPAROSCOPIC OVARIAN CYSTECTOMY Bilateral 07/08/2019   Procedure: LAPAROSCOPIC OVARIAN CYSTECTOMY with peritoneal biopsies;  Surgeon: Hannah Filbert, MD;  Location: [place];  Service: Gynecology;  Laterality: Bilateral;  . TONSILLECTOMY     adenoidectomy  . WISDOM TOOTH EXTRACTION      Past Obstetrical History:  OB History  Gravida Para Term Preterm AB Living  1 1 1  0 0 1  SAB TAB Ectopic Multiple Live Births  0 0 0 0 1    # Outcome Date GA Lbr Len/2nd Weight Sex Delivery Anes PTL Lv  1 Term 05/09/12 [redacted]w[redacted]d 10:02 / 00:57 6 lb 3.7 oz (2.825 kg) F Vag-Spont Gen  LIV    Past Gynecological History: As per HPI.   Social History:  Social History   Socioeconomic History  . Marital status: Married    Spouse name: Not on file  . Number of children: Not on file  . Years of education: Not on file  . Highest education level: Not on file  Occupational History  . Not on file  Tobacco Use  . Smoking  status: Never Smoker  . Smokeless tobacco: Never Used  Substance and Sexual Activity  . Alcohol use: No    Alcohol/week: 0.0 standard drinks  . Drug use: No  . Sexual activity: Yes    Birth control/protection: Pill  Other Topics Concern  . Not on file  Social History Narrative   Lives with husband and daughter in a one story home.     Works for [employer].     Education: some college.   Social Determinants of Health   Financial Resource Strain:   . Difficulty of Paying Living Expenses: Not on file  Food  Insecurity:   . Worried About Programme researcher, broadcasting/film/videounning Out of Food in the Last Year: Not on file  . Ran Out of Food in the Last Year: Not on file  Transportation Needs:   . Lack of Transportation (Medical): Not on file  . Lack of Transportation (Non-Medical): Not on file  Physical Activity:   . Days of Exercise per Week: Not on file  . Minutes of Exercise per Session: Not on file  Stress:   . Feeling of Stress : Not on file  Social Connections:   . Frequency of Communication with Friends and Family: Not on file  . Frequency of Social Gatherings with Friends and Family: Not on file  . Attends Religious Services: Not on file  . Active Member of Clubs or Organizations: Not on file  . Attends BankerClub or Organization Meetings: Not on file  . Marital Status: Not on file  Intimate Partner Violence:   . Fear of Current or Ex-Partner: Not on file  . Emotionally Abused: Not on file  . Physically Abused: Not on file  . Sexually Abused: Not on file    Family History:  Family History  Problem Relation Age of Onset  . Diabetes Maternal Grandmother   . Breast cancer Maternal Grandmother   . Parkinson's disease Maternal Grandmother   . Thyroid disease Father   . Kidney disease Paternal Grandmother   . Thyroid disease Paternal Grandmother   . Healthy Brother   . Healthy Daughter   . Anesthesia problems Neg Hx     Medications [name] had no medications administered during this visit. Current Outpatient Medications  Medication Sig Dispense Refill  . dicyclomine (BENTYL) 20 MG tablet Take 20 mg by mouth every 6 (six) hours.    Marland Kitchen. ibuprofen (ADVIL) 600 MG tablet Take 1 tablet (600 mg total) by mouth every 6 (six) hours as needed. 60 tablet 1  . levothyroxine (SYNTHROID) 25 MCG tablet Take 2 tablets by mouth once daily. 60 tablet 2  . SRONYX 0.1-20 MG-MCG tablet Take 1 tablet by mouth daily. 28 tablet 0  . valACYclovir (VALTREX) 1000 MG tablet Take 1 tablet (1,000 mg total) by mouth 2 (two) times daily. 6  tablet 2  . topiramate (TOPAMAX) 25 MG tablet TAKE 1 TABLET BY MOUTH TWICE A DAY (Patient not taking: Reported on 12/10/2019) 180 tablet 0   No current facility-administered medications for this visit.    Allergies Erythromycin and Penicillins   Physical Exam:  BP 139/87   Pulse 81   Ht 5\' 6"  (1.676 m)   Wt 198 lb (89.8 kg)   BMI 31.96 kg/m  Body mass index is 31.96 kg/m. General appearance: Well nourished, well developed female in no acute distress.  Cardiovascular: normal s1 and s2.  No murmurs, rubs or gallops. Respiratory:  Clear to auscultation bilateral. Normal respiratory effort Abdomen: positive bowel sounds  and no masses, hernias; diffusely non tender to palpation, non distended Neuro/Psych:  Normal mood and affect.  Skin:  Warm and dry.  Lymphatic:  No inguinal lymphadenopathy.   Pelvic exam: is not limited by body habitus EGBUS: within normal limits Vagina: normal, no d/c of blood. On valsalva with half speculum very slight anterior vault prolapse and none seen posteriorly. Her pelvic floor muscles do feel moderately weak Cervix: normal appearing cervix without tenderness, discharge or lesions.  Uterus:  nonenlarged and non tender and Adnexa:  normal adnexa and no mass, fullness, tenderness Rectovaginal: deferred  Laboratory: as per hpi  Radiology: as per hpi  Assessment: pt stable  Plan:  1. Women's annual routine gynecological examination I told her that her prolapse is very slight and normal for someone with a h/o a vaginal delivery, and I told her that I don't feel a hysterectomy would benefit her in any way especially since it sounds like her s/s are improved with OCPs so suppression her cycles is the primary management goal. I would recommend continuing on OCPs and for her to consider in the future switching over to a different pill like Yaz that may help with the bloating better. - Cytology - PAP( [facility]) - Cervicovaginal ancillary only( [facility]) - CMP - Lipase - Amylase  2. Upper abdominal pain Will check labs and trial of pepcid. If no improvement, recommend ruq u/s and seen by pcp - CMP - Lipase - Amylase  RTC PRN  Cornelia Copa MD Attending [facility])

## 2019-12-11 LAB — COMPREHENSIVE METABOLIC PANEL
ALT: 41 IU/L — ABNORMAL HIGH (ref 0–32)
AST: 32 IU/L (ref 0–40)
Albumin/Globulin Ratio: 1.8 (ref 1.2–2.2)
Albumin: 4.6 g/dL (ref 3.9–5.0)
Alkaline Phosphatase: 84 IU/L (ref 39–117)
BUN/Creatinine Ratio: 18 (ref 9–23)
BUN: 15 mg/dL (ref 6–20)
Bilirubin Total: 0.3 mg/dL (ref 0.0–1.2)
CO2: 25 mmol/L (ref 20–29)
Calcium: 9.8 mg/dL (ref 8.7–10.2)
Chloride: 100 mmol/L (ref 96–106)
Creatinine, Ser: 0.82 mg/dL (ref 0.57–1.00)
GFR calc Af Amer: 111 mL/min/{1.73_m2} (ref >59)
GFR calc non Af Amer: 96 mL/min/{1.73_m2} (ref >59)
Globulin, Total: 2.6 g/dL (ref 1.5–4.5)
Glucose: 83 mg/dL (ref 65–99)
Potassium: 3.8 mmol/L (ref 3.5–5.2)
Sodium: 141 mmol/L (ref 134–144)
Total Protein: 7.2 g/dL (ref 6.0–8.5)

## 2019-12-11 LAB — LIPASE: Lipase: 23 U/L (ref 14–72)

## 2019-12-11 LAB — AMYLASE: Amylase: 24 U/L — ABNORMAL LOW (ref 31–110)

## 2019-12-11 LAB — BETA HCG QUANT (REF LAB): hCG Quant: 5 m[IU]/mL

## 2019-12-14 LAB — CERVICOVAGINAL ANCILLARY ONLY
Bacterial Vaginitis (gardnerella): POSITIVE — AB
Candida Glabrata: NEGATIVE
Candida Vaginitis: NEGATIVE
Chlamydia: NEGATIVE
Comment: NEGATIVE
Comment: NEGATIVE
Comment: NEGATIVE
Comment: NEGATIVE
Comment: NEGATIVE
Comment: NORMAL
Neisseria Gonorrhea: NEGATIVE
Trichomonas: POSITIVE — AB

## 2019-12-16 ENCOUNTER — Other Ambulatory Visit: Payer: Self-pay

## 2019-12-16 ENCOUNTER — Encounter: Payer: Self-pay | Admitting: Family Medicine

## 2019-12-16 ENCOUNTER — Ambulatory Visit: Payer: BC Managed Care – PPO | Admitting: Family Medicine

## 2019-12-16 VITALS — BP 112/66 | HR 74 | Temp 98.6°F | Ht 66.0 in | Wt 203.8 lb

## 2019-12-16 DIAGNOSIS — R197 Diarrhea, unspecified: Secondary | ICD-10-CM | POA: Diagnosis not present

## 2019-12-16 DIAGNOSIS — R109 Unspecified abdominal pain: Secondary | ICD-10-CM | POA: Diagnosis not present

## 2019-12-16 DIAGNOSIS — A599 Trichomoniasis, unspecified: Secondary | ICD-10-CM

## 2019-12-16 DIAGNOSIS — E669 Obesity, unspecified: Secondary | ICD-10-CM | POA: Diagnosis not present

## 2019-12-16 DIAGNOSIS — G43009 Migraine without aura, not intractable, without status migrainosus: Secondary | ICD-10-CM

## 2019-12-16 LAB — CYTOLOGY - PAP
Comment: NEGATIVE
Diagnosis: NEGATIVE
High risk HPV: NEGATIVE

## 2019-12-16 NOTE — Patient Instructions (Addendum)
A resource that I like is www.dietdoctor.com (diabetes, intermittent fasting) You tube- Sharman Cheek,  Podcast- everyday wellness, Clare Gandy talk  Podcast- Get to the point with Alexis Goodell  Look at information regarding intermittent fasting and PCOS, hypothyroidism    I recommend you check your blood sugar daily and keep a log.  Very the time you check your blood sugar such as fasting, before meal, 2 hours after a meal and at bedtime.  Look for trends with the foods you are eating and be a scientist of your body.  Here are some guidelines to help you with meal planning -  Avoid all processed and packaged foods (bread, pasta, crackers, chips, etc) and beverages containing calories.  Avoid added sugars and excessive natural sugars.  Attention to how you feel if you consume artificial sweeteners.  Do they make you more hungry or raise your blood sugar?  With every meal and snack, aim to get 20 g of protein (3 ounces of meat, 4 ounces of fish, 3 eggs, protein powder, 1 cup [place] yogurt, 1 cup cottage cheese, etc.)  Increase fiber in the form of non-starchy vegetables.  These help you feel full with very little carbohydrates and are good for gut health.  Eat 1 serving healthy carb per meal- 1/2 cup brown rice, beans, potato, corn- pay attention to whether or not this significantly raises your blood sugar. If it does, reduce the frequency you consume these.   Eat 2-3 servings of lower sugar fruits daily.  This includes berries, apples, oranges, peaches, pears, one half banana.  Have small amounts of good fats such as avocado, nuts, olive oil, nut butters, olives.  Add a little cheese to your salads to make them tasty.

## 2019-12-16 NOTE — Progress Notes (Signed)
Subjective:    Patient ID: [name], female    DOB: March 12, 1991, 28 y.o.   MRN: [id number]  HPI This is a 28 yo female who presents today to establish care.  Previously seen at [facility] by Dr. Earlene Plater.  I also take care of her husband and daughter. Her main concern is weight gain.  Last CPE-12/10/20with gyn Pap- 12/10/19 Colonoscopy- 2017 Tdap- 05/10/2016 Flu-declines Exercise- daily, yoga, HIIT  Migraines- ran out of topiramate a month ago. No increase in headaches. Does better on her ocps.  Thinks this has contributed to weight gain.  Feels that they are well controlled.  Abdominal pain since 2017, better on OCPs. Feels cramping around abdomen. Nausea, headaches. Some relief with Bentyl. Diarrhea every morning since 2017.  Will have 3-4 episodes. Saw GI, had colonoscopy. "Microscopic colitis." Was on fiber which increased her stools throughout the day. Takes 1 bentyl daily.   Obesity- increased weight since coming off topiramate. Up to 213, got down to 180.   Past Medical History:  Diagnosis Date  . Headache    Migraines  . History of chicken pox   . Hypothyroidism   . Urinary tract infection   . Vertigo    Past Surgical History:  Procedure Laterality Date  . LAPAROSCOPIC OVARIAN CYSTECTOMY Bilateral 07/08/2019   Procedure: LAPAROSCOPIC OVARIAN CYSTECTOMY with peritoneal biopsies;  Surgeon: Allie Bossier, MD;  Location: [facility];  Service: Gynecology;  Laterality: Bilateral;  . TONSILLECTOMY     adenoidectomy  . WISDOM TOOTH EXTRACTION     Family History  Problem Relation Age of Onset  . Diabetes Maternal Grandmother   . Breast cancer Maternal Grandmother   . Parkinson's disease Maternal Grandmother   . Thyroid disease Father   . Kidney disease Paternal Grandmother   . Thyroid disease Paternal Grandmother   . Healthy Brother   . Healthy Daughter   . Anesthesia problems Neg Hx    Social History   Tobacco Use  .  Smoking status: Never Smoker  . Smokeless tobacco: Never Used  Substance Use Topics  . Alcohol use: No    Alcohol/week: 0.0 standard drinks  . Drug use: No      Review of Systems Per HPI    Objective:   Physical Exam Constitutional:      General: She is not in acute distress.    Appearance: Normal appearance. She is obese. She is not ill-appearing, toxic-appearing or diaphoretic.  HENT:     Head: Normocephalic and atraumatic.     Right Ear: External ear normal.     Left Ear: External ear normal.  Cardiovascular:     Rate and Rhythm: Normal rate.  Pulmonary:     Effort: Pulmonary effort is normal.  Neurological:     Mental Status: She is alert and oriented to person, place, and time.  Psychiatric:        Mood and Affect: Mood normal.        Behavior: Behavior normal.        Thought Content: Thought content normal.        Judgment: Judgment normal.       BP 112/66 (BP Location: Left Arm, Patient Position: Sitting, Cuff Size: Normal)   Pulse 74   Temp 98.6 F (37 C) (Temporal)   Ht 5\' 6"  (1.676 m)   Wt 203 lb 12.8 oz (92.4 kg)   SpO2 96%   BMI 32.89 kg/m  Wt Readings  from Last 3 Encounters:  12/16/19 203 lb 12.8 oz (92.4 kg)  12/10/19 198 lb (89.8 kg)  11/23/19 195 lb (88.5 kg)       Assessment & Plan:  1. Abdominal pain, unspecified abdominal location -I am unable to see colonoscopy or biopsy results in EMR.  It has been several years since she was seen and had colonoscopy.  Discussed referring her to GI but she is not interested right now. -Discussed possible causes of her pain including inflammatory bowel disease with chronic diarrhea.   - reviewed recent labs, no need to repeat any labs at this time  2. Migraine without aura and without status migrainosus, not intractable -She is currently satisfied with frequency of episodes and plans to continue her OCPs  3. Obesity (BMI 30.0-34.9) -Provided written and verbal information regarding healthy food  choices, resources and will be happy to refer to dietitian if she prefers.  4. Diarrhea, unspecified type - see #1  5. Trichomonas infection - per labs done at gyn last week, discussed findings with her and she will call to review and for treatment. She denies any vaginal symptoms. Per patient, has had in the past, several years ago.   This visit occurred during the SARS-CoV-2 public health emergency.  Safety protocols were in place, including screening questions prior to the visit, additional usage of staff PPE, and extensive cleaning of exam room while observing appropriate contact time as indicated for disinfecting solutions.    Clarene Reamer, FNP-BC  [facility]  12/16/2019 11:01 AM

## 2019-12-29 ENCOUNTER — Encounter: Payer: Self-pay | Admitting: Obstetrics and Gynecology

## 2019-12-29 ENCOUNTER — Other Ambulatory Visit: Payer: Self-pay

## 2019-12-29 ENCOUNTER — Encounter: Payer: Self-pay | Admitting: Obstetrics & Gynecology

## 2019-12-29 ENCOUNTER — Ambulatory Visit (INDEPENDENT_AMBULATORY_CARE_PROVIDER_SITE_OTHER): Payer: BC Managed Care – PPO | Admitting: Obstetrics & Gynecology

## 2019-12-29 ENCOUNTER — Other Ambulatory Visit: Payer: Self-pay | Admitting: Obstetrics and Gynecology

## 2019-12-29 VITALS — BP 126/82 | HR 84 | Wt 198.0 lb

## 2019-12-29 DIAGNOSIS — R7401 Elevation of levels of liver transaminase levels: Secondary | ICD-10-CM

## 2019-12-29 DIAGNOSIS — A599 Trichomoniasis, unspecified: Secondary | ICD-10-CM

## 2019-12-29 DIAGNOSIS — Z0000 Encounter for general adult medical examination without abnormal findings: Secondary | ICD-10-CM

## 2019-12-29 DIAGNOSIS — Z23 Encounter for immunization: Secondary | ICD-10-CM | POA: Diagnosis not present

## 2019-12-29 DIAGNOSIS — R102 Pelvic and perineal pain: Secondary | ICD-10-CM

## 2019-12-29 MED ORDER — METRONIDAZOLE 500 MG PO TABS: ORAL_TABLET | ORAL | 0 refills | Status: DC

## 2019-12-29 NOTE — Progress Notes (Signed)
   Subjective:    Patient ID: [name], female    DOB: 1991-06-29, 28 y.o.   MRN: [id number]  HPI 28 yo married P1 (28 yo) here today for followup of pelvic pain. She reports that her pain is quite manageable with her current OCPs. She reports that the pepcid that Dr. Ilda Basset prescribed has helped her upper abdomen discomfort.   She saw on [facility] that she had trich. She was never treated.   Review of Systems She has been married since 2016.    Objective:   Physical Exam Breathing, conversing, and ambulating normally Well nourished, well hydrated White female, no apparent distress Abd- benign     Assessment & Plan:  Dalbert Batman- treat her and her husband with flagyl Preventative care- start gardasil today Pelvic pain- doing well with OCPs (continuously) GI- doing well with pepcid I reassured her that she does not need a hysterectomy She declines a flu vaccine. Fasting labs at her convenience

## 2020-01-17 ENCOUNTER — Other Ambulatory Visit: Payer: Self-pay | Admitting: Obstetrics and Gynecology

## 2020-01-31 ENCOUNTER — Other Ambulatory Visit: Payer: Self-pay | Admitting: Endocrinology

## 2020-02-02 ENCOUNTER — Telehealth: Payer: Self-pay

## 2020-02-02 NOTE — Telephone Encounter (Signed)
lmtcb to schedule-FYI

## 2020-02-02 NOTE — Telephone Encounter (Signed)
-----   Message from Reather Littler, MD sent at 01/31/2020  1:05 PM EST ----- Regarding: Appointment Patient overdue for appointment.  Please call to schedule with labs before appointment

## 2020-02-02 NOTE — Telephone Encounter (Signed)
It may be best to send a MyChart message if patient does not pick up the phone, thanks

## 2020-02-03 ENCOUNTER — Telehealth: Payer: Self-pay

## 2020-02-03 NOTE — Telephone Encounter (Signed)
Called pt and left voicemail requesting a call back to schedule a follow up visit with Dr. Lucianne Muss.

## 2020-02-03 NOTE — Telephone Encounter (Signed)
-----   Message from Ajay Kumar, MD sent at 01/31/2020  1:05 PM EST ----- Regarding: Appointment Patient overdue for appointment.  Please call to schedule with labs before appointment    

## 2020-02-09 NOTE — Telephone Encounter (Signed)
Mychart message sent.

## 2020-02-23 ENCOUNTER — Other Ambulatory Visit: Payer: Self-pay

## 2020-02-23 ENCOUNTER — Ambulatory Visit (INDEPENDENT_AMBULATORY_CARE_PROVIDER_SITE_OTHER): Payer: BC Managed Care – PPO | Admitting: *Deleted

## 2020-02-23 VITALS — BP 113/75 | HR 68

## 2020-02-23 DIAGNOSIS — Z23 Encounter for immunization: Secondary | ICD-10-CM | POA: Diagnosis not present

## 2020-02-23 NOTE — Progress Notes (Signed)
Pt here for 2nd gardasil. Pt denies any issues. Pt tolerated injection well.

## 2020-02-24 NOTE — Progress Notes (Signed)
Patient seen and assessed by nursing staff during this encounter. I have reviewed the chart and agree with the documentation and plan.   Bing, MD 02/24/2020 9:12 AM

## 2020-02-26 ENCOUNTER — Other Ambulatory Visit: Payer: Self-pay

## 2020-02-26 ENCOUNTER — Other Ambulatory Visit (INDEPENDENT_AMBULATORY_CARE_PROVIDER_SITE_OTHER): Payer: BC Managed Care – PPO

## 2020-02-26 DIAGNOSIS — E063 Autoimmune thyroiditis: Secondary | ICD-10-CM | POA: Diagnosis not present

## 2020-02-26 LAB — T4, FREE: Free T4: 0.82 ng/dL (ref 0.60–1.60)

## 2020-02-26 LAB — TSH: TSH: 2.27 u[IU]/mL (ref 0.35–4.50)

## 2020-02-28 ENCOUNTER — Other Ambulatory Visit: Payer: Self-pay | Admitting: Endocrinology

## 2020-03-01 ENCOUNTER — Encounter: Payer: Self-pay | Admitting: Endocrinology

## 2020-03-01 ENCOUNTER — Other Ambulatory Visit: Payer: Self-pay

## 2020-03-01 ENCOUNTER — Ambulatory Visit (INDEPENDENT_AMBULATORY_CARE_PROVIDER_SITE_OTHER): Payer: BC Managed Care – PPO | Admitting: Endocrinology

## 2020-03-01 DIAGNOSIS — E063 Autoimmune thyroiditis: Secondary | ICD-10-CM

## 2020-03-01 MED ORDER — LEVOTHYROXINE SODIUM 50 MCG PO TABS: 50.0000 ug | ORAL_TABLET | Freq: Every day | ORAL | 1 refills | Status: DC

## 2020-03-01 NOTE — Progress Notes (Signed)
Patient ID: [name], female   DOB: 1991-07-02, 29 y.o.   MRN: [id number]             Reason for Appointment:  Hypothyroidism, follow-up visit   Today's office visit was provided via telemedicine using video technique Explained to the patient and the the limitations of evaluation and management by telemedicine and the availability of in person appointments.  The patient understood the limitations and agreed to proceed. Patient also understood that the telehealth visit is billable. . Location of the patient: Home . Location of the provider: Office Only the patient and myself were participating in the encounter     History of Present Illness:   Hypothyroidism was first diagnosed in 9/17  Patient has had symptoms of fatigue for about 4 years.  This had been fairly consistent and not significantly more recently She also has had an overall weight gain of about 40 pounds although this has been variable and at one point about 2 years ago had lost about 20 pounds with diet and exercise She has had some tendency to hair loss over the last year along with difficulty concentrating and memory Does have mild cold sensitivity but more in her feet  In 9/17 her gynecologist checked her TSH and it was about 6.  RECENT HISTORY: She has been on levothyroxine daily since 11/17, initially 25 g and subsequently higher doses  Initially with starting the levothyroxine she felt a little better with energy level and also had more motivation and better focusing.   When TSH was high normal  her dose was increased to 37.5 g in February 2018 This was again increased up to 50 mcg in 10/2019 when TSH was 3.3  She feels fairly good now and may be a little better with her energy although recently with less stress and getting more sleep she is doing better anyway She is still having difficulty with losing weight despite trying to exercise but has been able to maintain her weight now around 200 pounds  No  significant hair loss; her cold intolerance is better  She is consistent with taking her levothyroxine around 6 AM daily with water She is taking 2 tablets of the 25 mcg now  Her TSH has improved to 2.3  Patient's weight history is as follows:  Wt Readings from Last 3 Encounters:  12/29/19 198 lb (89.8 kg)  12/16/19 203 lb 12.8 oz (92.4 kg)  12/10/19 198 lb (89.8 kg)    Thyroid function results have been as follows:   TSH at baseline done in 9/17 from outside lab was 6  Lab Results  Component Value Date   TSH 2.27 02/26/2020   TSH 3.29 10/12/2019   TSH 1.35 02/24/2019   FREET4 0.82 02/26/2020   FREET4 0.70 10/12/2019   FREET4 0.83 02/24/2019      Past Medical History:  Diagnosis Date  . Headache    Migraines  . History of chicken pox   . Hypothyroidism   . Urinary tract infection   . Vertigo     Past Surgical History:  Procedure Laterality Date  . LAPAROSCOPIC OVARIAN CYSTECTOMY Bilateral 07/08/2019   Procedure: LAPAROSCOPIC OVARIAN CYSTECTOMY with peritoneal biopsies;  Surgeon: Allie Bossier, MD;  Location: [facility];  Service: Gynecology;  Laterality: Bilateral;  . TONSILLECTOMY     adenoidectomy  . WISDOM TOOTH EXTRACTION      Family History  Problem Relation Age of Onset  . Diabetes Maternal Grandmother   .  Breast cancer Maternal Grandmother   . Parkinson's disease Maternal Grandmother   . Thyroid disease Father   . Kidney disease Paternal Grandmother   . Thyroid disease Paternal Grandmother   . Healthy Brother   . Healthy Daughter   . Anesthesia problems Neg Hx     Social History:  reports that she has never smoked. She has never used smokeless tobacco. She reports that she does not drink alcohol or use drugs.  Allergies:  Allergies  Allergen Reactions  . Erythromycin   . Penicillins Hives    Can tolerate cephalosporins    Allergies as of 03/01/2020      Reactions   Erythromycin    Penicillins Hives   Can tolerate  cephalosporins      Medication List       Accurate as of March 01, 2020  9:20 AM. If you have any questions, ask your nurse or doctor.        [employer] Acid Controller 10 MG tablet Generic drug: famotidine TAKE 1 TABLET BY MOUTH TWICE A DAY   dicyclomine 20 MG tablet Commonly known as: BENTYL Take 20 mg by mouth every 6 (six) hours.   ibuprofen 600 MG tablet Commonly known as: ADVIL Take 1 tablet (600 mg total) by mouth every 6 (six) hours as needed.   levothyroxine 25 MCG tablet Commonly known as: SYNTHROID TAKE 2 TABLETS BY MOUTH EVERY DAY   metroNIDAZOLE 500 MG tablet Commonly known as: FLAGYL Take two tablets by mouth twice a day, for one day.  Or you can take all four tablets at once if you can tolerate it.   Sronyx 0.1-20 MG-MCG tablet Generic drug: levonorgestrel-ethinyl estradiol Take 1 tablet by mouth daily.   topiramate 25 MG tablet Commonly known as: TOPAMAX TAKE 1 TABLET BY MOUTH TWICE A DAY   valACYclovir 1000 MG tablet Commonly known as: VALTREX Take 1 tablet (1,000 mg total) by mouth 2 (two) times daily.        Review of Systems  She has polycystic ovaries, on birth control pills She did have laparoscopic cyst removal in 7/20         Examination:    There were no vitals taken for this visit.   No exam done, patient is on a virtual visit   Assessment:  HYPOTHYROIDISM with baseline TSH of 6, mildly symptomatic   She had improved symptomatically with levothyroxine supplementation Her symptoms have been somewhat nonspecific and empirically her dose has been increased on her last visit She does think that she feels a little better with her energy and has less cold intolerance Weight has leveled off  Currently her TSH is 2.3 compared to 3.3 previously  Weight gain: Discussed that she needs to plan her meals consistently well Also with less sleep deprivation and stress she may be able to lose weight better  PLAN:   Continue 50 mcg of  levothyroxine daily Follow-up in 6 months  Ronald Vinsant Dwyane Dee 03/01/2020, 9:20 AM    Note: This office note was prepared with Dragon voice recognition system technology. Any transcriptional errors that result from this process are unintentional.

## 2020-05-27 ENCOUNTER — Encounter (INDEPENDENT_AMBULATORY_CARE_PROVIDER_SITE_OTHER): Payer: Self-pay | Admitting: Family Medicine

## 2020-05-31 ENCOUNTER — Encounter (INDEPENDENT_AMBULATORY_CARE_PROVIDER_SITE_OTHER): Payer: Self-pay | Admitting: Family Medicine

## 2020-05-31 ENCOUNTER — Ambulatory Visit (INDEPENDENT_AMBULATORY_CARE_PROVIDER_SITE_OTHER): Payer: 59 | Admitting: Family Medicine

## 2020-05-31 ENCOUNTER — Other Ambulatory Visit: Payer: Self-pay

## 2020-05-31 VITALS — BP 122/75 | HR 62 | Temp 98.1°F | Ht 65.0 in | Wt 199.0 lb

## 2020-05-31 DIAGNOSIS — Z0289 Encounter for other administrative examinations: Secondary | ICD-10-CM

## 2020-05-31 DIAGNOSIS — R5383 Other fatigue: Secondary | ICD-10-CM | POA: Diagnosis not present

## 2020-05-31 DIAGNOSIS — E038 Other specified hypothyroidism: Secondary | ICD-10-CM

## 2020-05-31 DIAGNOSIS — R7989 Other specified abnormal findings of blood chemistry: Secondary | ICD-10-CM | POA: Diagnosis not present

## 2020-05-31 DIAGNOSIS — Z6833 Body mass index (BMI) 33.0-33.9, adult: Secondary | ICD-10-CM

## 2020-05-31 DIAGNOSIS — Z9189 Other specified personal risk factors, not elsewhere classified: Secondary | ICD-10-CM

## 2020-05-31 DIAGNOSIS — F3289 Other specified depressive episodes: Secondary | ICD-10-CM | POA: Diagnosis not present

## 2020-05-31 DIAGNOSIS — E039 Hypothyroidism, unspecified: Secondary | ICD-10-CM | POA: Insufficient documentation

## 2020-05-31 DIAGNOSIS — E669 Obesity, unspecified: Secondary | ICD-10-CM | POA: Insufficient documentation

## 2020-05-31 DIAGNOSIS — R0602 Shortness of breath: Secondary | ICD-10-CM | POA: Diagnosis not present

## 2020-05-31 DIAGNOSIS — Z6831 Body mass index (BMI) 31.0-31.9, adult: Secondary | ICD-10-CM | POA: Insufficient documentation

## 2020-05-31 DIAGNOSIS — F32A Depression, unspecified: Secondary | ICD-10-CM | POA: Insufficient documentation

## 2020-05-31 NOTE — Progress Notes (Signed)
Chief Complaint:   OBESITY [name] (MR# [id number]) is a 29 y.o. female who presents for evaluation and treatment of obesity and related comorbidities. Current BMI is Body mass index is 33.12 kg/m. [name] has been struggling with her weight for many years and has been unsuccessful in either losing weight, maintaining weight loss, or reaching her healthy weight goal.  [name] is currently in the action stage of change and ready to dedicate time achieving and maintaining a healthier weight. [name] is interested in becoming our patient and working on intensive lifestyle modifications including (but not limited to) diet and exercise for weight loss.  [name]'s habits were reviewed today and are as follows: Her family eats meals together, she thinks her family will eat healthier with her, she struggles with family and or coworkers weight loss sabotage, her desired weight loss is 54 lbs, she has been heavy most of her life, she started gaining weight after her daughter was born, her heaviest weight ever was 213 pounds, she is a picky eater and doesn't like to eat healthier foods, she has significant food cravings issues, she skips meals frequently, she is frequently drinking liquids with calories, she frequently makes poor food choices, she has problems with excessive hunger, she frequently eats larger portions than normal and she struggles with emotional eating.  Depression Screen [name]'s Food and Mood (modified PHQ-9) score was 10.  Depression screen PHQ 2/9 05/31/2020  Decreased Interest 1  Down, Depressed, Hopeless 1  PHQ - 2 Score 2  Altered sleeping 0  Tired, decreased energy 2  Change in appetite 2  Feeling bad or failure about yourself  1  Trouble concentrating 3  Moving slowly or fidgety/restless 0  Suicidal thoughts 0  PHQ-9 Score 10  Difficult doing work/chores Not difficult at all   Subjective:   1. Other fatigue [name] admits to daytime somnolence and admits to waking up  still tired. Patent has a history of symptoms of daytime fatigue. [name] generally gets 7 hours of sleep per night, and states that she has generally restful sleep. Snoring is present. Apneic episodes are not present. Epworth Sleepiness Score is 14.  2. Shortness of breath on exertion [name] notes increasing shortness of breath with exercising and seems to be worsening over time with weight gain. She notes getting out of breath sooner with activity than she used to. This has not gotten worse recently. [name] denies shortness of breath at rest or orthopnea.  3. Other specified hypothyroidism [name] is currently on levothyroxine 50 mcg q daily.  4. Elevated LFTs [name] has a history of elevated ALT from [facility] on 12/10/2019.  5. Other depression with emotional eating [name] PHQ-9 score is 10. She is not currently on any anti-depressants.  6. At risk for deficient intake of food The patient is at a higher than average risk of deficient intake of food due to skipping meals frequently.  Assessment/Plan:   1. Other fatigue [name] does feel that her weight is causing her energy to be lower than it should be. Fatigue may be related to obesity, depression or many other causes. Labs will be ordered, and in the meanwhile, [name] will focus on self care including making healthy food choices, increasing physical activity and focusing on stress reduction.  - EKG 12-Lead - CBC with Differential/Platelet - Comprehensive metabolic panel - Hemoglobin A1c - Insulin, random - Lipid Panel With LDL/HDL Ratio - VITAMIN D 25 Hydroxy (Vit-D Deficiency, Fractures) - Vitamin B12 - Folate  2. Shortness of breath on exertion [name] does feel that she gets out of breath more easily that she used to when she exercises. [name]'s shortness of breath appears to be obesity related and exercise induced. She has agreed to work on weight loss and gradually increase exercise to treat her exercise induced shortness of breath. Will  continue to monitor closely.  3. Other specified hypothyroidism Patient with long-standing hypothyroidism, on levothyroxine therapy. She appears euthyroid. We will check labs today. Orders and follow up as documented in patient record.  Counseling . Good thyroid control is important for overall health. Supratherapeutic thyroid levels are dangerous and will not improve weight loss results. . The correct way to take levothyroxine is fasting, with water, separated by at least 30 minutes from breakfast, and separated by more than 4 hours from calcium, iron, multivitamins, acid reflux medications (PPIs).   - CBC with Differential/Platelet - Comprehensive metabolic panel - Hemoglobin A1c - Insulin, random - Vitamin B12 - Folate - T3 - T4, free - TSH  4. Elevated LFTs We discussed the likely diagnosis of non-alcoholic fatty liver disease today and how this condition is obesity related. [name] was educated the importance of weight loss. [name] agreed to continue with her weight loss efforts with healthier diet and exercise as an essential part of her treatment plan. We will check labs today.  - CBC with Differential/Platelet - Comprehensive metabolic panel - Hemoglobin A1c - Insulin, random - Lipid Panel With LDL/HDL Ratio  5. Other depression with emotional eating Behavior modification techniques were discussed today to help [name] deal with her emotional/non-hunger eating behaviors. We will refer to Dr. Dewaine Conger, our Bariatric Psychologist for evaluation. Orders and follow up as documented in patient record.   6. At risk for deficient intake of food [name] was given approximately 30 minutes of deficit intake of food prevention counseling today. [name] is at risk for eating too few calories based on current food recall. She was encouraged to focus on meeting caloric and protein goals according to her recommended meal plan.   7. Class 1 obesity with serious comorbidity and body mass index (BMI) of  33.0 to 33.9 in adult, unspecified obesity type [name] is currently in the action stage of change and her goal is to continue with weight loss efforts. I recommend [name] begin the structured treatment plan as follows:  She has agreed to the Category 3 Plan.  Exercise goals: As is.   Behavioral modification strategies: increasing lean protein intake, decreasing simple carbohydrates, increasing water intake, no skipping meals and meal planning and cooking strategies.  She was informed of the importance of frequent follow-up visits to maximize her success with intensive lifestyle modifications for her multiple health conditions. She was informed we would discuss her lab results at her next visit unless there is a critical issue that needs to be addressed sooner. [name] agreed to keep her next visit at the agreed upon time to discuss these results.  Objective:   Blood pressure 122/75, pulse 62, temperature 98.1 F (36.7 C), temperature source Oral, height 5\' 5"  (1.651 m), weight 199 lb (90.3 kg), last menstrual period 12/29/2019, SpO2 99 %. Body mass index is 33.12 kg/m.  EKG: Normal sinus rhythm, rate 64 BPM.  Indirect Calorimeter completed today shows a VO2 of 272 and a REE of 1896.  Her calculated basal metabolic rate is 12/31/2019 thus her basal metabolic rate is better than expected.  General: Cooperative, alert, well developed, in no acute distress. HEENT: Conjunctivae and  lids unremarkable. Cardiovascular: Regular rhythm.  Lungs: Normal work of breathing. Neurologic: No focal deficits.   Lab Results  Component Value Date   CREATININE 0.82 12/10/2019   BUN 15 12/10/2019   NA 141 12/10/2019   K 3.8 12/10/2019   CL 100 12/10/2019   CO2 25 12/10/2019   Lab Results  Component Value Date   ALT 41 (H) 12/10/2019   AST 32 12/10/2019   ALKPHOS 84 12/10/2019   BILITOT 0.3 12/10/2019   No results found for: HGBA1C No results found for: INSULIN Lab Results  Component Value Date   TSH  2.27 02/26/2020   No results found for: CHOL, HDL, LDLCALC, LDLDIRECT, TRIG, CHOLHDL Lab Results  Component Value Date   WBC 6.4 02/24/2019   HGB 13.7 02/24/2019   HCT 39.9 02/24/2019   MCV 88.7 02/24/2019   PLT 303.0 02/24/2019   Lab Results  Component Value Date   IRON 57 02/24/2019   FERRITIN 30.7 02/24/2019   Attestation Statements:   Reviewed by clinician on day of visit: allergies, medications, problem list, medical history, surgical history, family history, social history, and previous encounter notes.   I, Trixie Dredge, am acting as transcriptionist for Acquanetta Belling, MD.  I have reviewed the above documentation for accuracy and completeness, and I agree with the above. - Dennard Nip, MD

## 2020-05-31 NOTE — Progress Notes (Signed)
Office: [phone]  /  Fax: [phone]    Date: June 09, 2020   Appointment Start Time: 9:01am Duration: 32 minutes Provider: Lawerance Cruel, Psy.D. Type of Session: Intake for Individual Therapy  Location of Patient: Home Location of Provider: Provider's Home Type of Contact: Telepsychological Visit via MyChart Video Visit  Informed Consent: Prior to proceeding with today's appointment, two pieces of identifying information were obtained. In addition, Zanya's physical location at the time of this appointment was obtained as well a phone number she could be reached at in the event of technical difficulties. Aeon and this provider participated in today's telepsychological service.   The provider's role was explained to Duanne Moron. The provider reviewed and discussed issues of confidentiality, privacy, and limits therein (e.g., reporting obligations). In addition to verbal informed consent, written informed consent for psychological services was obtained prior to the initial appointment. Since the clinic is not a 24/7 crisis center, mental health emergency resources were shared and this  provider explained MyChart, e-mail, voicemail, and/or other messaging systems should be utilized only for non-emergency reasons. This provider also explained that information obtained during appointments will be placed in Ellajane's medical record and relevant information will be shared with other providers at [facility] for coordination of care. Moreover, Carita agreed information may be shared with other [facility] providers as needed for coordination of care. By signing the service agreement document, Elianny provided written consent for coordination of care. Prior to initiating telepsychological services, Crescent completed an informed consent document, which included the development of a safety plan (i.e., an emergency contact, nearest emergency room, and emergency resources) in the  event of an emergency/crisis. Lucerito expressed understanding of the rationale of the safety plan. Lithzy verbally acknowledged understanding she is ultimately responsible for understanding her insurance benefits for telepsychological and in-person services. This provider also reviewed confidentiality, as it relates to telepsychological services, as well as the rationale for telepsychological services (i.e., to reduce exposure risk to COVID-19). Maleeah  acknowledged understanding that appointments cannot be recorded without both party consent and she is aware she is responsible for securing confidentiality on her end of the session. Markiyah verbally consented to proceed.  Chief Complaint/HPI: Yakelin was referred by Dr. Quillian Quince due to other depression, with emotional eating. Per the note for the initial visit with Dr. Quillian Quince on May 31, 2020, "Kamyla's PHQ-9 score is 10. She is not currently on any anti-depressants." The note for the initial appointment with Dr. Quillian Quince  indicated the following: "Anjali's habits were reviewed today and are as follows: Her family eats meals together, she thinks her family will eat healthier with her, she struggles with family and or coworkers weight loss sabotage, her desired weight loss is 54 lbs, she has been heavy most of her life, she started gaining weight after her daughter was born, her heaviest weight ever was 213 pounds, she is a picky eater and doesn't like to eat healthier foods, she has significant food cravings issues, she skips meals frequently, she is frequently drinking liquids with calories, she frequently makes poor food choices, she has problems with excessive hunger, she frequently eats larger portions than normal and she struggles with emotional eating." Monasia's Food and Mood (modified PHQ-9) score on May 31, 2020 was 10.  During today's appointment, Tiffanee reported she "never worried" about food, but believes she uses for satisfaction. She was  verbally administered a questionnaire assessing various behaviors related to emotional eating. Sybol endorsed  the following: overeat when you are celebrating, experience food cravings on a regular basis, find food is comforting to you, overeat frequently when you are bored or lonely, eat to help you stay awake and eat as a reward. She shared she craves sweets, noting an improvement since starting with the clinic. Catalena believes the onset of emotional eating was likely in childhood and described the frequency of emotional eating prior to starting with the clinic as "daily." In addition, Bryar denied a history of binge eating. Chonita denied a history of restricting food intake, purging and engagement in other compensatory strategies, and has never been diagnosed with an eating disorder. She also denied a history of treatment for emotional eating. Moreover, Thersia indicated celebration, connecting with family, and boredom triggers emotional eating, whereas having a plan makes emotional eating better. Furthermore, Brit described herself as a "people pleaser."   Mental Status Examination:  Appearance: well groomed and appropriate hygiene  Behavior: appropriate to circumstances Mood: euthymic Affect: mood congruent Speech: normal in rate, volume, and tone Eye Contact: appropriate Psychomotor Activity: appropriate Gait: unable to assess Thought Process: linear, logical, and goal directed  Thought Content/Perception: denies suicidal and homicidal ideation, plan, and intent and no hallucinations, delusions, bizarre thinking or behavior reported or observed Orientation: time, person, place and purpose of appointment Memory/Concentration: memory, attention, language, and fund of knowledge intact  Insight/Judgment: good  Family & Psychosocial History: Yaritzy reported she is married and she has an 23 year old daughter. She noted she also has a step-son (age 12). She indicated she is currently not employed.  Additionally, Jaeden shared her highest level of education obtained is "some college." Currently, Clinton's social support system consists of her husband, daughter, friends, and family. Moreover, Moncia stated she resides with her husband and daughter.   Medical History:  Past Medical History:  Diagnosis Date  . Fatty liver   . Headache    Migraines  . History of chicken pox   . Hypothyroidism   . IBS (irritable bowel syndrome)   . Microscopic colitis   . PCOS (polycystic ovarian syndrome)   . Urinary tract infection   . Vertigo    Past Surgical History:  Procedure Laterality Date  . LAPAROSCOPIC OVARIAN CYSTECTOMY Bilateral 07/08/2019   Procedure: LAPAROSCOPIC OVARIAN CYSTECTOMY with peritoneal biopsies;  Surgeon: Emily Filbert, MD;  Location: [facility];  Service: Gynecology;  Laterality: Bilateral;  . TONSILLECTOMY     adenoidectomy  . WISDOM TOOTH EXTRACTION     Current Outpatient Medications on File Prior to Visit  Medication Sig Dispense Refill  . dicyclomine (BENTYL) 20 MG tablet Take 20 mg by mouth every 6 (six) hours.    . famotidine ([employer] ACID CONTROLLER) 10 MG tablet Take 10 mg by mouth daily as needed for heartburn or indigestion.    Marland Kitchen levothyroxine (SYNTHROID) 50 MCG tablet Take 1 tablet (50 mcg total) by mouth daily. 90 tablet 1  . SRONYX 0.1-20 MG-MCG tablet Take 1 tablet by mouth daily. 3 Package 3  . valACYclovir (VALTREX) 1000 MG tablet Take 1 tablet (1,000 mg total) by mouth 2 (two) times daily. (Patient taking differently: Take 1,000 mg by mouth 2 (two) times daily as needed. ) 6 tablet 2   No current facility-administered medications on file prior to visit.  Oluwaseyi denied a history of head injuries and loss of consciousness.    Mental Health History: Tavionna denied a history of therapeutic and psychiatric services. Laiba reported there is no history of  hospitalizations for psychiatric concerns. Lecia denied a family history of mental health related  concerns. Loretta reported there is no history of trauma including psychological, physical  and sexual abuse, as well as neglect.   Jiya described her typical mood lately as "great" especially since starting the meal plan. Cherish endorsed social alcohol use. She denied tobacco use. She denied illicit/recreational substance use. Regarding caffeine intake, Tailyn reported consuming sugar free Red Bull (8 oz) daily. Furthermore, Carlen indicated she is not experiencing the following: hallucinations and delusions, paranoia, symptoms of mania , social withdrawal, crying spells, panic attacks and decreased motivation. She also denied history of and current suicidal ideation, plan, and intent; history of and current homicidal ideation, plan, and intent; and history of and current engagement in self-harm.  The following strengths were reported by Yeilyn: get along with others, Museum/gallery curator, keeping the house clean, and focusing on self-care. The following strengths were observed by this provider: ability to express thoughts and feelings during the therapeutic session, ability to establish and benefit from a therapeutic relationship, willingness to work toward established goal(s) with the clinic and ability to engage in reciprocal conversation.  Legal History: Melvinia reported there is no history of legal involvement.   Structured Assessments Results: The Patient Health Questionnaire-9 (PHQ-9) is a self-report measure that assesses symptoms and severity of depression over the course of the last two weeks. Kazuko obtained a score of 1 suggesting minimal depression. Shanigua finds the endorsed symptoms to be not difficult at all. [0= Not at all; 1= Several days; 2= More than half the days; 3= Nearly every day] Little interest or pleasure in doing things 0  Feeling down, depressed, or hopeless 0  Trouble falling or staying asleep, or sleeping too much 0  Feeling tired or having little energy 1  Poor appetite or overeating 0    Feeling bad about yourself --- or that you are a failure or have let yourself or your family down 0  Trouble concentrating on things, such as reading the newspaper or watching television 0  Moving or speaking so slowly that other people could have noticed? Or the opposite --- being so fidgety or restless that you have been moving around a lot more than usual 0  Thoughts that you would be better off dead or hurting yourself in some way 0  PHQ-9 Score 1    The Generalized Anxiety Disorder-7 (GAD-7) is a brief self-report measure that assesses symptoms of anxiety over the course of the last two weeks. Lasharn obtained a score of 0. [0= Not at all; 1= Several days; 2= Over half the days; 3= Nearly every day] Feeling nervous, anxious, on edge 0  Not being able to stop or control worrying 0  Worrying too much about different things 0  Trouble relaxing 0  Being so restless that it's hard to sit still 0  Becoming easily annoyed or irritable 0  Feeling afraid as if something awful might happen 0  GAD-7 Score 0   Interventions:  Conducted a chart review Focused on rapport building Verbally administered PHQ-9 and GAD-7 for symptom monitoring Verbally administered Food & Mood questionnaire to assess various behaviors related to emotional eating Provided emphatic reflections and validation Collaborated with patient on a treatment goal  Psychoeducation provided regarding physical versus emotional hunger  Provisional DSM-5 Diagnosis(es): 307.59 (F50.8) Other Specified Feeding or Eating Disorder, Emotional Eating Behaviors  Plan: Evelia appears able and willing to participate as evidenced by collaboration on a treatment goal, engagement  in reciprocal conversation, and asking questions as needed for clarification. The next appointment will be scheduled in three weeks, which will be via MyChart Video Visit. The following treatment goal was established: increase coping skills. This provider will regularly  review the treatment plan and medical chart to keep informed of status changes. Hollis expressed understanding and agreement with the initial treatment plan of care. Yeimi will be sent a handout via e-mail to utilize between now and the next appointment to increase awareness of hunger patterns and subsequent eating. Kinaya provided verbal consent during today's appointment for this provider to send the handout via e-mail.

## 2020-06-01 LAB — T3: T3, Total: 168 ng/dL (ref 71–180)

## 2020-06-01 LAB — COMPREHENSIVE METABOLIC PANEL
ALT: 19 IU/L (ref 0–32)
AST: 19 IU/L (ref 0–40)
Albumin/Globulin Ratio: 1.6 (ref 1.2–2.2)
Albumin: 4.4 g/dL (ref 3.8–4.8)
Alkaline Phosphatase: 77 IU/L (ref 48–121)
BUN/Creatinine Ratio: 11 (ref 9–23)
BUN: 10 mg/dL (ref 6–20)
Bilirubin Total: 0.2 mg/dL (ref 0.0–1.2)
CO2: 25 mmol/L (ref 20–29)
Calcium: 9.6 mg/dL (ref 8.7–10.2)
Chloride: 100 mmol/L (ref 96–106)
Creatinine, Ser: 0.88 mg/dL (ref 0.57–1.00)
GFR calc Af Amer: 101 mL/min/{1.73_m2} (ref >59)
GFR calc non Af Amer: 88 mL/min/{1.73_m2} (ref >59)
Globulin, Total: 2.7 g/dL (ref 1.5–4.5)
Glucose: 79 mg/dL (ref 65–99)
Potassium: 4.3 mmol/L (ref 3.5–5.2)
Sodium: 140 mmol/L (ref 134–144)
Total Protein: 7.1 g/dL (ref 6.0–8.5)

## 2020-06-01 LAB — CBC WITH DIFFERENTIAL/PLATELET
Basophils Absolute: 0 10*3/uL (ref 0.0–0.2)
Basos: 0 %
EOS (ABSOLUTE): 0.1 10*3/uL (ref 0.0–0.4)
Eos: 1 %
Hematocrit: 43.5 % (ref 34.0–46.6)
Hemoglobin: 14.5 g/dL (ref 11.1–15.9)
Immature Grans (Abs): 0 10*3/uL (ref 0.0–0.1)
Immature Granulocytes: 0 %
Lymphocytes Absolute: 1.7 10*3/uL (ref 0.7–3.1)
Lymphs: 25 %
MCH: 30.2 pg (ref 26.6–33.0)
MCHC: 33.3 g/dL (ref 31.5–35.7)
MCV: 91 fL (ref 79–97)
Monocytes Absolute: 0.4 10*3/uL (ref 0.1–0.9)
Monocytes: 6 %
Neutrophils Absolute: 4.7 10*3/uL (ref 1.4–7.0)
Neutrophils: 68 %
Platelets: 322 10*3/uL (ref 150–450)
RBC: 4.8 x10E6/uL (ref 3.77–5.28)
RDW: 11.7 % (ref 11.7–15.4)
WBC: 6.9 10*3/uL (ref 3.4–10.8)

## 2020-06-01 LAB — LIPID PANEL WITH LDL/HDL RATIO
Cholesterol, Total: 219 mg/dL — ABNORMAL HIGH (ref 100–199)
HDL: 57 mg/dL (ref >39)
LDL Chol Calc (NIH): 144 mg/dL — ABNORMAL HIGH (ref 0–99)
LDL/HDL Ratio: 2.5 ratio (ref 0.0–3.2)
Triglycerides: 103 mg/dL (ref 0–149)
VLDL Cholesterol Cal: 18 mg/dL (ref 5–40)

## 2020-06-01 LAB — HEMOGLOBIN A1C
Est. average glucose Bld gHb Est-mCnc: 97 mg/dL
Hgb A1c MFr Bld: 5 % (ref 4.8–5.6)

## 2020-06-01 LAB — VITAMIN B12: Vitamin B-12: 560 pg/mL (ref 232–1245)

## 2020-06-01 LAB — TSH: TSH: 1.71 u[IU]/mL (ref 0.450–4.500)

## 2020-06-01 LAB — FOLATE: Folate: 15.3 ng/mL (ref >3.0)

## 2020-06-01 LAB — INSULIN, RANDOM: INSULIN: 13.6 u[IU]/mL (ref 2.6–24.9)

## 2020-06-01 LAB — T4, FREE: Free T4: 1.13 ng/dL (ref 0.82–1.77)

## 2020-06-01 LAB — VITAMIN D 25 HYDROXY (VIT D DEFICIENCY, FRACTURES): Vit D, 25-Hydroxy: 76.1 ng/mL (ref 30.0–100.0)

## 2020-06-09 ENCOUNTER — Other Ambulatory Visit: Payer: Self-pay

## 2020-06-09 ENCOUNTER — Telehealth (INDEPENDENT_AMBULATORY_CARE_PROVIDER_SITE_OTHER): Payer: BC Managed Care – PPO | Admitting: Psychology

## 2020-06-09 ENCOUNTER — Encounter: Payer: Self-pay | Admitting: Family Medicine

## 2020-06-09 DIAGNOSIS — F5089 Other specified eating disorder: Secondary | ICD-10-CM

## 2020-06-10 NOTE — Telephone Encounter (Signed)
Patient seen in office

## 2020-06-14 ENCOUNTER — Ambulatory Visit (INDEPENDENT_AMBULATORY_CARE_PROVIDER_SITE_OTHER): Payer: 59 | Admitting: Family Medicine

## 2020-06-14 ENCOUNTER — Encounter (INDEPENDENT_AMBULATORY_CARE_PROVIDER_SITE_OTHER): Payer: Self-pay | Admitting: Family Medicine

## 2020-06-14 ENCOUNTER — Other Ambulatory Visit: Payer: Self-pay

## 2020-06-14 VITALS — BP 117/72 | HR 67 | Temp 98.3°F | Ht 65.0 in | Wt 194.0 lb

## 2020-06-14 DIAGNOSIS — Z6832 Body mass index (BMI) 32.0-32.9, adult: Secondary | ICD-10-CM

## 2020-06-14 DIAGNOSIS — F3289 Other specified depressive episodes: Secondary | ICD-10-CM | POA: Diagnosis not present

## 2020-06-14 DIAGNOSIS — E7849 Other hyperlipidemia: Secondary | ICD-10-CM | POA: Diagnosis not present

## 2020-06-14 DIAGNOSIS — E038 Other specified hypothyroidism: Secondary | ICD-10-CM

## 2020-06-14 DIAGNOSIS — E669 Obesity, unspecified: Secondary | ICD-10-CM

## 2020-06-14 DIAGNOSIS — R7989 Other specified abnormal findings of blood chemistry: Secondary | ICD-10-CM | POA: Diagnosis not present

## 2020-06-14 DIAGNOSIS — E8881 Metabolic syndrome: Secondary | ICD-10-CM

## 2020-06-16 NOTE — Progress Notes (Signed)
Chief Complaint:   OBESITY [name] is here to discuss her progress with her obesity treatment plan along with follow-up of her obesity related diagnoses. [name] is on the Category 3 Plan and states she is following her eating plan approximately 95% of the time. [name] states she is doing strengthening and cardio for 60 minutes 3-4 times per week.  Today's visit was #: 2 Starting weight: 199 lbs Starting date: 05/31/2020 Today's weight: 194 lbs Today's date: 06/14/2020 Total lbs lost to date: 5 Total lbs lost since last in-office visit: 5  Interim History: [name] met with Dr. Mallie Mussel and she notes it worked well for her, and she felt she gathered a lot of useful information and she enjoyed her very much. She has an 31 year old daughter and husband at home. She is still struggling a bit with celebration eating and she is trying to change her relationship with food. She overate her calories, but she notes good hunger and cravings control.  Subjective:   1. Other specified hypothyroidism [name]'s recent labs were within normal limits. I discussed labs with the patient today.  2. Elevated LFTs [name]'s ALT was 41, down to 19 now. I discussed labs with the patient today.  3. Other depression with emotional eating [name] met with Dr. Mallie Mussel, and notes this was helpful.  4. Other hyperlipidemia with elevated LDL [name] has a new diagnosis of hyperlipidemia. Her recent LDL is elevated. She notes a family history of hyperlipidemia. I discussed labs with the patient today.  5. Insulin resistance [name] has a new diagnosis of insulin resistance. She states she has a family history of diabetes mellitus. I discussed labs with the patient today.  Assessment/Plan:   1. Other specified hypothyroidism Patient with long-standing hypothyroidism, on levothyroxine therapy. She appears euthyroid. Heath will continue her medications, and we will continue to monitor. Orders and follow up as documented in patient  record.  2. Elevated LFTs We discussed the likely diagnosis of non-alcoholic fatty liver disease today and how this condition is obesity related. [name] was educated the importance of weight loss. [name] agreed to continue with her weight loss efforts with healthier diet and exercise as an essential part of her treatment plan. We will continue to monitor.  3. Other depression with emotional eating Behavior modification techniques were discussed today to help [name] deal with her emotional/non-hunger eating behaviors. She will work on Chief Operating Officer to food, celebration, and relationship to food. Orders and follow up as documented in patient record.   4. Other hyperlipidemia with elevated LDL Cardiovascular risk and specific lipid/LDL goals reviewed. We discussed several lifestyle modifications today and [name] will continue to work on diet, exercise and weight loss efforts. Orders and follow up as documented in patient record.   5. Insulin resistance [name] will continue to work on weight loss, diet, exercise, and decreasing simple carbohydrates to help decrease the risk of diabetes. [name] agreed to follow-up with [name] as directed to closely monitor her progress.  6. Class 1 obesity with serious comorbidity and body mass index (BMI) of 32.0 to 32.9 in adult, unspecified obesity type [name] is currently in the action stage of change. As such, her goal is to continue with weight loss efforts. She has agreed to the Category 3 Plan.   Exercise goals: As is.  Behavioral modification strategies: emotional eating strategies and celebration eating strategies.  [name] has agreed to follow-up with our clinic in 2 weeks. She was informed of the importance of frequent  follow-up visits to maximize her success with intensive lifestyle modifications for her multiple health conditions.   Objective:   Blood pressure 117/72, pulse 67, temperature 98.3 F (36.8 C), temperature source Oral, height _0  (1.651 m),  weight 194 lb (88 kg), SpO2 98 %. Body mass index is 32.28 kg/m.  General: Cooperative, alert, well developed, in no acute distress. HEENT: Conjunctivae and lids unremarkable. Cardiovascular: Regular rhythm.  Lungs: Normal work of breathing. Neurologic: No focal deficits.   Lab Results  Component Value Date   CREATININE 0.88 05/31/2020   BUN 10 05/31/2020   NA 140 05/31/2020   K 4.3 05/31/2020   CL 100 05/31/2020   CO2 25 05/31/2020   Lab Results  Component Value Date   ALT 19 05/31/2020   AST 19 05/31/2020   ALKPHOS 77 05/31/2020   BILITOT 0.2 05/31/2020   Lab Results  Component Value Date   HGBA1C 5.0 05/31/2020   Lab Results  Component Value Date   INSULIN 13.6 05/31/2020   Lab Results  Component Value Date   TSH 1.710 05/31/2020   Lab Results  Component Value Date   CHOL 219 (H) 05/31/2020   HDL 57 05/31/2020   LDLCALC 144 (H) 05/31/2020   TRIG 103 05/31/2020   Lab Results  Component Value Date   WBC 6.9 05/31/2020   HGB 14.5 05/31/2020   HCT 43.5 05/31/2020   MCV 91 05/31/2020   PLT 322 05/31/2020   Lab Results  Component Value Date   IRON 57 02/24/2019   FERRITIN 30.7 02/24/2019   Attestation Statements:   Reviewed by clinician on day of visit: allergies, medications, problem list, medical history, surgical history, family history, social history, and previous encounter notes.  Time spent on visit including pre-visit chart review and post-visit care and charting was 40 minutes.    I, Trixie Dredge, am acting as transcriptionist for Dennard Nip, MD.  I have reviewed the above documentation for accuracy and completeness, and I agree with the above. -  Dennard Nip, MD

## 2020-06-16 NOTE — Progress Notes (Signed)
  Office: [phone]  /  Fax: [phone]    Date: June 30, 2020   Appointment Start Time: 9:59am Duration: 28 minutes Provider: Lawerance Cruel, Psy.D. Type of Session: Individual Therapy  Location of Patient: Home Location of Provider: Provider's Home Type of Contact: Telepsychological Visit via MyChart Video Visit  Session Content: [name] is a 29 y.o. female presenting via MyChart Video Visit for a follow-up appointment to address the previously established treatment goal of increasing coping skills. Today's appointment was a telepsychological visit due to COVID-19. [name] provided verbal consent for today's telepsychological appointment and she is aware she is responsible for securing confidentiality on her end of the session. Prior to proceeding with today's appointment, [name]'s physical location at the time of this appointment was obtained as well a phone number she could be reached at in the event of technical difficulties. [name] and this provider participated in today's telepsychological service.   This provider conducted a brief check-in. [name] reported using the provided handout. She also discussed the "last two weeks have made [her] stronger." Psychoeducation regarding triggers for emotional eating was provided. [name] was provided a handout, and encouraged to utilize the handout between now and the next appointment to increase awareness of triggers and frequency. [name] agreed. This provider also discussed behavioral strategies for specific triggers, such as placing the utensil down when conversing to avoid mindless eating. [name] provided verbal consent during today's appointment for this provider to send a handout about triggers via e-mail. [name] was receptive to today's appointment as evidenced by openness to sharing, responsiveness to feedback, and willingness to explore triggers for emotional eating.  Mental Status Examination:  Appearance: well groomed and appropriate hygiene    Behavior: appropriate to circumstances Mood: euthymic Affect: mood congruent Speech: normal in rate, volume, and tone Eye Contact: appropriate Psychomotor Activity: appropriate Gait: unable to assess Thought Process: linear, logical, and goal directed  Thought Content/Perception: no hallucinations, delusions, bizarre thinking or behavior reported or observed and no evidence of suicidal and homicidal ideation, plan, and intent Orientation: time, person, place, and purpose of appointment Memory/Concentration: memory, attention, language, and fund of knowledge intact  Insight/Judgment: good  Interventions:  Conducted a brief chart review Provided empathic reflections and validation Reviewed content from the previous session Employed supportive psychotherapy interventions to facilitate reduced distress and to improve coping skills with identified stressors Psychoeducation provided regarding triggers for emotional eating  DSM-5 Diagnosis(es): 307.59 (F50.8) Other Specified Feeding or Eating Disorder, Emotional Eating Behaviors  Treatment Goal & Progress: During the initial appointment with this provider, the following treatment goal was established: increase coping skills. [name] demonstrated increased awareness of hunger patterns resulting in making better choices.  Plan: Based on recent progress, the frequency of appointments will be reduced. As such, the next appointment will be scheduled in one month, which will be via MyChart Video Visit. The next session will focus on working towards the established treatment goal.

## 2020-06-21 ENCOUNTER — Ambulatory Visit (INDEPENDENT_AMBULATORY_CARE_PROVIDER_SITE_OTHER): Payer: 59

## 2020-06-21 ENCOUNTER — Other Ambulatory Visit: Payer: Self-pay

## 2020-06-21 VITALS — BP 122/65 | HR 72

## 2020-06-21 DIAGNOSIS — Z23 Encounter for immunization: Secondary | ICD-10-CM | POA: Diagnosis not present

## 2020-06-21 NOTE — Progress Notes (Signed)
Patient came in today for hpv #3  injection

## 2020-06-28 ENCOUNTER — Encounter (INDEPENDENT_AMBULATORY_CARE_PROVIDER_SITE_OTHER): Payer: Self-pay | Admitting: Family Medicine

## 2020-06-28 ENCOUNTER — Ambulatory Visit (INDEPENDENT_AMBULATORY_CARE_PROVIDER_SITE_OTHER): Payer: 59 | Admitting: Family Medicine

## 2020-06-28 ENCOUNTER — Other Ambulatory Visit: Payer: Self-pay

## 2020-06-28 VITALS — BP 135/82 | HR 75 | Temp 98.1°F | Ht 65.0 in | Wt 192.0 lb

## 2020-06-28 DIAGNOSIS — E8881 Metabolic syndrome: Secondary | ICD-10-CM | POA: Diagnosis not present

## 2020-06-28 DIAGNOSIS — E038 Other specified hypothyroidism: Secondary | ICD-10-CM | POA: Diagnosis not present

## 2020-06-28 DIAGNOSIS — Z9189 Other specified personal risk factors, not elsewhere classified: Secondary | ICD-10-CM

## 2020-06-28 DIAGNOSIS — E669 Obesity, unspecified: Secondary | ICD-10-CM

## 2020-06-28 DIAGNOSIS — F3289 Other specified depressive episodes: Secondary | ICD-10-CM | POA: Diagnosis not present

## 2020-06-28 DIAGNOSIS — Z6832 Body mass index (BMI) 32.0-32.9, adult: Secondary | ICD-10-CM

## 2020-06-28 NOTE — Progress Notes (Signed)
Chief Complaint:   OBESITY [name] is here to discuss her progress with her obesity treatment plan along with follow-up of her obesity related diagnoses. [name] is on the Category 3 Plan and states she is following her eating plan approximately 85% of the time. [name] states she is doing strength training, cardio, and mixed cardio for 60 minutes 5 times per week.  Today's visit was #: 3 Starting weight: 199 lbs Starting date: 05/31/2020 Today's weight: 192 lbs Today's date: 06/28/2020 Total lbs lost to date: 7 lbs Total lbs lost since last in-office visit: 2 lbs  Interim History: [name] says she has been traveling a lot.  She took along a snack box that had a lot of string cheese, yogurt, etc., and says she made better choices.  She says that during most meals, she was mindful of eating.  She snacked on yogurt, apples, and string cheese.  She loves the program and loves how she is feeling.  She reports that she is much happier and feels in better control.  She has a very supportive family.  Subjective:   1. Insulin resistance [name] has a diagnosis of insulin resistance based on her elevated fasting insulin level >5. She continues to work on diet and exercise to decrease her risk of diabetes.  Lab Results  Component Value Date   INSULIN 13.6 05/31/2020   Lab Results  Component Value Date   HGBA1C 5.0 05/31/2020   2. Other specified hypothyroidism [name] is taking levothyroxine 50 mcg daily.  Lab Results  Component Value Date   TSH 1.710 05/31/2020   3. Other depression with emotional eating [name] has been to see Dr. Dewaine Conger and found it very useful!  She is changing the way she looks at food and feels about continuing with Dr. Dewaine Conger.  4. At risk for dehydration [name] is at increased risk for dehydration due to working outside more and increased activity levels.  She needs to be sure she gets adequate amounts of fluid.  Assessment/Plan:   1. Insulin resistance [name] will  continue to work on weight loss, exercise, and decreasing simple carbohydrates to help decrease the risk of diabetes. Seven agreed to follow-up with [name] as directed to closely monitor her progress.  2. Other specified hypothyroidism Patient with long-standing hypothyroidism, on levothyroxine therapy. She appears euthyroid. Orders and follow up as documented in patient record.  Counseling . Good thyroid control is important for overall health. Supratherapeutic thyroid levels are dangerous and will not improve weight loss results. . The correct way to take levothyroxine is fasting, with water, separated by at least 30 minutes from breakfast, and separated by more than 4 hours from calcium, iron, multivitamins, acid reflux medications (PPIs).   3. Other depression with emotional eating Behavior modification techniques were discussed today to help [name] deal with her emotional/non-hunger eating behaviors.  Orders and follow up as documented in patient record.  Continue with Dr. Dewaine Conger.  Continue mindful eating practices.  4. At risk for dehydration [name] was given approximately 15 minutes dehydration prevention counseling today. [name] is at risk for dehydration due to weight loss and current medication(s). She was encouraged to hydrate and monitor fluid status to avoid dehydration as well as weight loss plateaus.   5. Class 1 obesity with serious comorbidity and body mass index (BMI) of 32.0 to 32.9 in adult, unspecified obesity type [name] is currently in the action stage of change. As such, her goal is to continue with weight loss efforts. She  has agreed to the Category 3 Plan.   Exercise goals: As is.  Behavioral modification strategies: celebration eating strategies.  [name] has agreed to follow-up with our clinic in 2 weeks. She was informed of the importance of frequent follow-up visits to maximize her success with intensive lifestyle modifications for her multiple health conditions.    Objective:   Blood pressure 135/82, pulse 75, temperature 98.1 F (36.7 C), temperature source Oral, height 5\' 5"  (1.651 m), weight 192 lb (87.1 kg), SpO2 98 %. Body mass index is 31.95 kg/m.  General: Cooperative, alert, well developed, in no acute distress. HEENT: Conjunctivae and lids unremarkable. Cardiovascular: Regular rhythm.  Lungs: Normal work of breathing. Neurologic: No focal deficits.   Lab Results  Component Value Date   CREATININE 0.88 05/31/2020   BUN 10 05/31/2020   NA 140 05/31/2020   K 4.3 05/31/2020   CL 100 05/31/2020   CO2 25 05/31/2020   Lab Results  Component Value Date   ALT 19 05/31/2020   AST 19 05/31/2020   ALKPHOS 77 05/31/2020   BILITOT 0.2 05/31/2020   Lab Results  Component Value Date   HGBA1C 5.0 05/31/2020   Lab Results  Component Value Date   INSULIN 13.6 05/31/2020   Lab Results  Component Value Date   TSH 1.710 05/31/2020   Lab Results  Component Value Date   CHOL 219 (H) 05/31/2020   HDL 57 05/31/2020   LDLCALC 144 (H) 05/31/2020   TRIG 103 05/31/2020   Lab Results  Component Value Date   WBC 6.9 05/31/2020   HGB 14.5 05/31/2020   HCT 43.5 05/31/2020   MCV 91 05/31/2020   PLT 322 05/31/2020   Lab Results  Component Value Date   IRON 57 02/24/2019   FERRITIN 30.7 02/24/2019   Attestation Statements:   Reviewed by clinician on day of visit: allergies, medications, problem list, medical history, surgical history, family history, social history, and previous encounter notes.  I, 02/26/2019, CMA, am acting as Insurance claims handler for Energy manager, DO.  I have reviewed the above documentation for accuracy and completeness, and I agree with the above. -  [employer], DO

## 2020-06-30 ENCOUNTER — Other Ambulatory Visit: Payer: Self-pay

## 2020-06-30 ENCOUNTER — Telehealth (INDEPENDENT_AMBULATORY_CARE_PROVIDER_SITE_OTHER): Payer: 59 | Admitting: Psychology

## 2020-06-30 ENCOUNTER — Telehealth (INDEPENDENT_AMBULATORY_CARE_PROVIDER_SITE_OTHER): Payer: Self-pay | Admitting: Psychology

## 2020-06-30 DIAGNOSIS — F5089 Other specified eating disorder: Secondary | ICD-10-CM | POA: Diagnosis not present

## 2020-06-30 NOTE — Telephone Encounter (Signed)
  Office: [phone]  /  Fax: [phone]  Date of Call: June 30, 2020  Time of Call: 2:28pm Provider: Lawerance Cruel, PsyD  CONTENT: This provider called [place] to schedule a follow-up appointment, as Epic was down during today's appointment and the appointment could not be scheduled. A HIPAA compliant voicemail was left requesting a call back.   PLAN: This provider will wait for [place] to call back. No further follow-up planned by this provider.

## 2020-07-12 ENCOUNTER — Other Ambulatory Visit: Payer: Self-pay

## 2020-07-12 ENCOUNTER — Ambulatory Visit (INDEPENDENT_AMBULATORY_CARE_PROVIDER_SITE_OTHER): Payer: 59 | Admitting: Family Medicine

## 2020-07-12 ENCOUNTER — Encounter (INDEPENDENT_AMBULATORY_CARE_PROVIDER_SITE_OTHER): Payer: Self-pay | Admitting: Family Medicine

## 2020-07-12 VITALS — BP 117/79 | HR 78 | Temp 98.1°F | Ht 65.0 in | Wt 191.0 lb

## 2020-07-12 DIAGNOSIS — Z6831 Body mass index (BMI) 31.0-31.9, adult: Secondary | ICD-10-CM | POA: Diagnosis not present

## 2020-07-12 DIAGNOSIS — K219 Gastro-esophageal reflux disease without esophagitis: Secondary | ICD-10-CM

## 2020-07-12 DIAGNOSIS — E669 Obesity, unspecified: Secondary | ICD-10-CM

## 2020-07-12 DIAGNOSIS — E66811 Obesity, class 1: Secondary | ICD-10-CM

## 2020-07-12 DIAGNOSIS — E038 Other specified hypothyroidism: Secondary | ICD-10-CM

## 2020-07-13 NOTE — Progress Notes (Signed)
Chief Complaint:   OBESITY [name] is here to discuss her progress with her obesity treatment plan along with follow-up of her obesity related diagnoses. [name] is on the Category 3 Plan and states she is following her eating plan approximately 90% of the time. [name] states she is doing strength training and cardio for 60 minutes 5 times per week.  Today's visit was #: 4 Starting weight: 199 lbs Starting date: 05/31/2020 Today's weight: 191 lbs Today's date: 07/12/2020 Total lbs lost to date: 8 lbs Total lbs lost since last in-office visit: 1 lb  Interim History: [name] says she has been more mindful of foods and happier with herself now that she is making better choices and feels more in control of her life.  She is struggling with meal prep still and she is not really doing it yet.  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  She says she has more time now that she stopped working earlier this year.  She used to see Dr. Earlene Plater as her PCP.  Subjective:   1. Other specified hypothyroidism [name] takes levothyroxine 50 mg daily.  Lab Results  Component Value Date   TSH 1.710 05/31/2020   2. Gastroesophageal reflux disease without esophagitis No complaints or concerns today.  She is taking famotidine and tolerating it well.  Assessment/Plan:   1. Other specified hypothyroidism Patient with long-standing hypothyroidism, on levothyroxine therapy. She appears euthyroid. Orders and follow up as documented in patient record.  Continue medications.  Will continue to monitor.  Continue prudent nutritional plan, weight loss.  Counseling  Good thyroid control is important for overall health. Supratherapeutic thyroid levels are dangerous and will not improve weight loss results.  The correct way to take levothyroxine is fasting, with water, separated by at least 30 minutes from breakfast, and separated by more than 4 hours from calcium, iron,  multivitamins, acid reflux medications (PPIs).   2. Gastroesophageal reflux disease without esophagitis Intensive lifestyle modifications are the first line treatment for this issue. We discussed several lifestyle modifications today and she will continue to work on diet, exercise and weight loss efforts. Orders and follow up as documented in patient record.  Continue prudent nutritional plan, weight loss.  Continue medication per PCP.  Will continue to monitor alongside them.  Counseling  If a person has gastroesophageal reflux disease (GERD), food and stomach acid move back up into the esophagus and cause symptoms or problems such as damage to the esophagus.  Anti-reflux measures include: raising the head of the bed, avoiding tight clothing or belts, avoiding eating late at night, not lying down shortly after mealtime, and achieving weight loss.  Avoid ASA, NSAID's, caffeine, alcohol, and tobacco.   OTC Pepcid and/or Tums are often very helpful for as needed use.   However, for persisting chronic or daily symptoms, stronger medications like Omeprazole may be needed.  You may need to avoid foods and drinks such as: ? Coffee and tea (with or without caffeine). ? Drinks that contain alcohol. ? Energy drinks and sports drinks. ? Bubbly (carbonated) drinks or sodas. ? Chocolate and cocoa. ? Peppermint and mint flavorings. ? Garlic and onions. ? Horseradish. ? Spicy and acidic foods. These include peppers, chili powder, curry powder, vinegar, hot sauces, and BBQ sauce. ? Citrus fruit juices and citrus fruits, such as oranges, lemons, and limes. ? Tomato-based foods. These include red sauce, chili, salsa, and pizza with red sauce. ?  Fried and fatty foods. These include donuts, french fries, potato chips, and high-fat dressings. ? High-fat meats. These include hot dogs, rib eye steak, sausage, ham, and bacon.  3. Class 1 obesity with serious comorbidity and body mass index (BMI) of 31.0 to  31.9 in adult, unspecified obesity type [name] is currently in the action stage of change. As such, her goal is to continue with weight loss efforts. She has agreed to the Category 3 Plan.   Exercise goals: As is.  Behavioral modification strategies: increasing lean protein intake, increasing water intake, meal planning and cooking strategies and planning for success.  [name] has agreed to follow-up with our clinic in 2 weeks. She was informed of the importance of frequent follow-up visits to maximize her success with intensive lifestyle modifications for her multiple health conditions.   Objective:   Blood pressure 117/79, pulse 78, temperature 98.1 F (36.7 C), temperature source Oral, height 5\' 5"  (1.651 m), weight 191 lb (86.6 kg), SpO2 97 %. Body mass index is 31.78 kg/m.  General: Cooperative, alert, well developed, in no acute distress. HEENT: Conjunctivae and lids unremarkable. Cardiovascular: Regular rhythm.  Lungs: Normal work of breathing. Neurologic: No focal deficits.   Lab Results  Component Value Date   CREATININE 0.88 05/31/2020   BUN 10 05/31/2020   NA 140 05/31/2020   K 4.3 05/31/2020   CL 100 05/31/2020   CO2 25 05/31/2020   Lab Results  Component Value Date   ALT 19 05/31/2020   AST 19 05/31/2020   ALKPHOS 77 05/31/2020   BILITOT 0.2 05/31/2020   Lab Results  Component Value Date   HGBA1C 5.0 05/31/2020   Lab Results  Component Value Date   INSULIN 13.6 05/31/2020   Lab Results  Component Value Date   TSH 1.710 05/31/2020   Lab Results  Component Value Date   CHOL 219 (H) 05/31/2020   HDL 57 05/31/2020   LDLCALC 144 (H) 05/31/2020   TRIG 103 05/31/2020   Lab Results  Component Value Date   WBC 6.9 05/31/2020   HGB 14.5 05/31/2020   HCT 43.5 05/31/2020   MCV 91 05/31/2020   PLT 322 05/31/2020   Lab Results  Component Value Date   IRON 57 02/24/2019   FERRITIN 30.7 02/24/2019   Attestation Statements:   Reviewed by clinician on  day of visit: allergies, medications, problem list, medical history, surgical history, family history, social history, and previous encounter notes.  Time spent on visit including pre-visit chart review and post-visit care and charting was 30 minutes.   I, 02/26/2019, CMA, am acting as Insurance claims handler for Energy manager, DO.  I have reviewed the above documentation for accuracy and completeness, and I agree with the above. -  [employer], DO

## 2020-07-14 NOTE — Progress Notes (Signed)
Office: [phone]  /  Fax: [phone]    Date: July 28, 2020   Appointment Start Time: 10:04am Duration: 24 minutes Provider: Lawerance Cruel, Psy.D. Type of Session: Individual Therapy  Location of Patient: Parked in car outside grocery store  Location of Provider: Provider's Home Type of Contact: Telepsychological Visit via MyChart Video Visit  Session Content: This provider called [name] at 10:02am as she did not present for the telepsychological appointment. The call was answered and disconnected; therefore, this provider called again. [name] stated she attempted to connect, but was unsuccessful. A text with the link was sent. As such, today's appointment was initiated 4 minutes late. [name] is a 29 y.o. female presenting via MyChart Video Visit for a follow-up appointment to address the previously established treatment goal of increasing coping skills. Today's appointment was a telepsychological visit due to COVID-19. [name] provided verbal consent for today's telepsychological appointment and she is aware she is responsible for securing confidentiality on her end of the session. Prior to proceeding with today's appointment, [name]'s physical location at the time of this appointment was obtained as well a phone number she could be reached at in the event of technical difficulties. [name] and this provider participated in today's telepsychological service.   This provider conducted a brief check-in. [name] stated she is "very mentally aware" about ongoing stressors, including her grandmother's passing. She acknowledged the aforementioned impacted her eating habits, adding she resumed her meal plan on Monday. Positive reinforcement was provided. Additionally, triggers for emotional eating were reviewed. She shared what she observed. Moreover, psychoeducation regarding mindfulness was provided. A handout was provided to [name] with further information regarding mindfulness, including exercises. This  provider also explained the benefit of mindfulness as it relates to emotional eating. [name] was encouraged to engage in the provided exercises between now and the next appointment with this provider. [name] agreed. During today's appointment, [name] was led through a mindfulness exercise involving her senses. [name] provided verbal consent during today's appointment for this provider to send a handout about mindfulness via e-mail.  [name] was receptive to today's appointment as evidenced by openness to sharing, responsiveness to feedback, and willingness to engage in mindfulness exercises to assist with coping.  Mental Status Examination:  Appearance: well groomed and appropriate hygiene  Behavior: appropriate to circumstances Mood: euthymic Affect: mood congruent Speech: normal in rate, volume, and tone Eye Contact: appropriate Psychomotor Activity: appropriate Gait: unable to assess Thought Process: linear, logical, and goal directed  Thought Content/Perception: no hallucinations, delusions, bizarre thinking or behavior reported or observed and no evidence of suicidal and homicidal ideation, plan, and intent Orientation: time, person, place, and purpose of appointment Memory/Concentration: memory, attention, language, and fund of knowledge intact  Insight/Judgment: fair  Interventions:  Conducted a brief chart review Provided empathic reflections and validation Provided positive reinforcement Employed supportive psychotherapy interventions to facilitate reduced distress and to improve coping skills with identified stressors Psychoeducation provided regarding mindfulness Engaged patient in mindfulness exercise(s) Employed acceptance and commitment interventions to emphasize mindfulness and acceptance without struggle  DSM-5 Diagnosis(es): 307.59 (F50.8) Other Specified Feeding or Eating Disorder, Emotional Eating Behaviors  Treatment Goal & Progress: During the initial appointment with this  provider, the following treatment goal was established: increase coping skills. [name] has demonstrated progress in her goal as evidenced by increased awareness of hunger patterns and increased awareness of triggers for emotional eating. [name] also demonstrates willingness to engage in mindfulness exercises.  Plan: The next appointment will be scheduled in 2-3 weeks, which  will be via MyChart Video Visit. The next session will focus on working towards the established treatment goal.

## 2020-07-26 ENCOUNTER — Ambulatory Visit (INDEPENDENT_AMBULATORY_CARE_PROVIDER_SITE_OTHER): Payer: 59 | Admitting: Physician Assistant

## 2020-07-26 ENCOUNTER — Other Ambulatory Visit: Payer: Self-pay

## 2020-07-26 ENCOUNTER — Encounter (INDEPENDENT_AMBULATORY_CARE_PROVIDER_SITE_OTHER): Payer: Self-pay | Admitting: Physician Assistant

## 2020-07-26 VITALS — BP 131/83 | HR 82 | Temp 98.3°F | Ht 65.0 in | Wt 192.0 lb

## 2020-07-26 DIAGNOSIS — E669 Obesity, unspecified: Secondary | ICD-10-CM

## 2020-07-26 DIAGNOSIS — E038 Other specified hypothyroidism: Secondary | ICD-10-CM

## 2020-07-26 DIAGNOSIS — Z6832 Body mass index (BMI) 32.0-32.9, adult: Secondary | ICD-10-CM

## 2020-07-26 DIAGNOSIS — E8881 Metabolic syndrome: Secondary | ICD-10-CM | POA: Diagnosis not present

## 2020-07-27 NOTE — Progress Notes (Signed)
Chief Complaint:   OBESITY [name] is here to discuss her progress with her obesity treatment plan along with follow-up of her obesity related diagnoses. [name] is on the Category 3 Plan and states she is following her eating plan approximately 50-60% of the time. [name] states she is doing cardio/HIIT 60 minutes 5 times per week.  Today's visit was #: 5 Starting weight: 199 lbs Starting date: 05/31/2020 Today's weight: 192 lbs Today's date: 07/26/2020 Total lbs lost to date: 7 Total lbs lost since last in-office visit: 0  Interim History: [name] reports that her weight gain today was expected due to being off track the last few weeks. They are redoing their kitchen and ordered out more often.  Subjective:   Other specified hypothyroidism. [name] is on levothyroxine. She denies excessive fatigue.  Lab Results  Component Value Date   TSH 1.710 05/31/2020   Insulin resistance.  Assessment/Plan:   Other specified hypothyroidism. Patient with long-standing hypothyroidism, on levothyroxine therapy. She appears euthyroid. Orders and follow up as documented in patient record. Edwina will continue levothyroxine as directed.   Counseling . Good thyroid control is important for overall health. Supratherapeutic thyroid levels are dangerous and will not improve weight loss results. . The correct way to take levothyroxine is fasting, with water, separated by at least 30 minutes from breakfast, and separated by more than 4 hours from calcium, iron, multivitamins, acid reflux medications (PPIs).   Insulin resistance.  Class 1 obesity with serious comorbidity and body mass index (BMI) of 32.0 to 32.9 in adult, unspecified obesity type.  [name] is currently in the action stage of change. As such, her goal is to continue with weight loss efforts. She has agreed to keeping a food journal and adhering to recommended goals of 1500-1600 calories and 100 grams of protein daily.   Exercise  goals: For substantial health benefits, adults should do at least 150 minutes (2 hours and 30 minutes) a week of moderate-intensity, or 75 minutes (1 hour and 15 minutes) a week of vigorous-intensity aerobic physical activity, or an equivalent combination of moderate- and vigorous-intensity aerobic activity. Aerobic activity should be performed in episodes of at least 10 minutes, and preferably, it should be spread throughout the week.  Behavioral modification strategies: increasing lean protein intake and avoiding temptations.  [name] has agreed to follow-up with our clinic in 2 weeks. She was informed of the importance of frequent follow-up visits to maximize her success with intensive lifestyle modifications for her multiple health conditions.   Objective:   Blood pressure (!) 131/83, pulse 82, temperature 98.3 F (36.8 C), temperature source Oral, height 5\' 5"  (1.651 m), weight 192 lb (87.1 kg), SpO2 99 %. Body mass index is 31.95 kg/m.  General: Cooperative, alert, well developed, in no acute distress. HEENT: Conjunctivae and lids unremarkable. Cardiovascular: Regular rhythm.  Lungs: Normal work of breathing. Neurologic: No focal deficits.   Lab Results  Component Value Date   CREATININE 0.88 05/31/2020   BUN 10 05/31/2020   NA 140 05/31/2020   K 4.3 05/31/2020   CL 100 05/31/2020   CO2 25 05/31/2020   Lab Results  Component Value Date   ALT 19 05/31/2020   AST 19 05/31/2020   ALKPHOS 77 05/31/2020   BILITOT 0.2 05/31/2020   Lab Results  Component Value Date   HGBA1C 5.0 05/31/2020   Lab Results  Component Value Date   INSULIN 13.6 05/31/2020   Lab Results  Component Value Date  TSH 1.710 05/31/2020   Lab Results  Component Value Date   CHOL 219 (H) 05/31/2020   HDL 57 05/31/2020   LDLCALC 144 (H) 05/31/2020   TRIG 103 05/31/2020   Lab Results  Component Value Date   WBC 6.9 05/31/2020   HGB 14.5 05/31/2020   HCT 43.5 05/31/2020   MCV 91 05/31/2020    PLT 322 05/31/2020   Lab Results  Component Value Date   IRON 57 02/24/2019   FERRITIN 30.7 02/24/2019   Attestation Statements:   Reviewed by clinician on day of visit: allergies, medications, problem list, medical history, surgical history, family history, social history, and previous encounter notes.  Time spent on visit including pre-visit chart review and post-visit charting and care was 30 minutes.   IMarianna Payment, am acting as transcriptionist for Alois Cliche, PA-C   I have reviewed the above documentation for accuracy and completeness, and I agree with the above. Alois Cliche, PA-C

## 2020-07-28 ENCOUNTER — Other Ambulatory Visit: Payer: Self-pay

## 2020-07-28 ENCOUNTER — Telehealth (INDEPENDENT_AMBULATORY_CARE_PROVIDER_SITE_OTHER): Payer: 59 | Admitting: Psychology

## 2020-07-28 DIAGNOSIS — F5089 Other specified eating disorder: Secondary | ICD-10-CM | POA: Diagnosis not present

## 2020-08-01 NOTE — Progress Notes (Signed)
  Office: [phone]  /  Fax: [phone]    Date: August 15, 2020   Appointment Start Time: 10:00am Duration: 23 minutes Provider: Lawerance Leach, Psy.D. Type of Session: Individual Therapy  Location of Patient: Home Location of Provider: [facility] Type of Contact: Telepsychological Visit via MyChart Video Visit  Session Content: [name] is a 29 y.o. female presenting via MyChart Video Visit for a follow-up appointment to address the previously established treatment goal of increasing coping skills. Today's appointment was a telepsychological visit due to COVID-19. [name] provided verbal consent for today's telepsychological appointment and she is aware she is responsible for securing confidentiality on her end of the session. Prior to proceeding with today's appointment, [name]'s physical location at the time of this appointment was obtained as well a phone number she could be reached at in the event of technical difficulties. [name] and this provider participated in today's telepsychological service.   This provider conducted a brief check-in. [name] stated, "It's been a very normal two weeks." She added it has been "a lot easier" to follow her meal plan and exercise. Positive reinforcement was provided. Session focused further on mindfulness to assist with coping. She noted, "That has helped my whole family." [name] discussed engaging in exercises previously shared. [name] was led through a mindfulness exercise (A Taste of Mindfulness) during today's appointment and her experience was processed. [name] provided verbal consent during today's appointment for this provider to send the handout for today's exercise via e-mail. This provider also discussed the utilization of YouTube for mindfulness exercises (e.g., exercises by Rhae Hammock). Furthermore, termination planning was discussed. [name] was receptive to a follow-up appointment in 3-4 weeks and an additional  follow-up/termination appointment in 3-4 weeks after that. [name] was receptive to today's appointment as evidenced by openness to sharing, responsiveness to feedback, and willingness to continue engaging in mindfulness exercises.  Mental Status Examination:  Appearance: well groomed and appropriate hygiene  Behavior: appropriate to circumstances Mood: euthymic Affect: mood congruent Speech: normal in rate, volume, and tone Eye Contact: appropriate Psychomotor Activity: appropriate Gait: unable to assess Thought Process: linear, logical, and goal directed  Thought Content/Perception: no hallucinations, delusions, bizarre thinking or behavior reported or observed and no evidence of suicidal and homicidal ideation, plan, and intent Orientation: time, person, place, and purpose of appointment Memory/Concentration: memory, attention, language, and fund of knowledge intact  Insight/Judgment: good  Interventions:  Conducted a brief chart review Provided empathic reflections and validation Provided positive reinforcement Employed supportive psychotherapy interventions to facilitate reduced distress and to improve coping skills with identified stressors Engaged patient in mindfulness exercise(s) Employed acceptance and commitment interventions to emphasize mindfulness and acceptance without struggle Discussed termination planning  DSM-5 Diagnosis(es): 307.59 (F50.8) Other Specified Feeding or Eating Disorder, Emotional Eating Behaviors  Treatment Goal & Progress: During the initial appointment with this provider, the following treatment goal was established: increase coping skills. [name] has demonstrated progress in her goal as evidenced by increased awareness of hunger patterns and increased awareness of triggers for emotional eating. [name] also demonstrates willingness to engage in mindfulness exercises.  Plan: The next appointment will be scheduled in 3-4 weeks, which will be via MyChart  Video Visit. The next session will focus on working towards the established treatment goal.

## 2020-08-09 ENCOUNTER — Encounter (INDEPENDENT_AMBULATORY_CARE_PROVIDER_SITE_OTHER): Payer: Self-pay | Admitting: Physician Assistant

## 2020-08-09 ENCOUNTER — Ambulatory Visit (INDEPENDENT_AMBULATORY_CARE_PROVIDER_SITE_OTHER): Payer: 59 | Admitting: Physician Assistant

## 2020-08-09 ENCOUNTER — Other Ambulatory Visit: Payer: Self-pay

## 2020-08-09 VITALS — BP 122/81 | HR 79 | Temp 98.0°F | Ht 65.0 in | Wt 189.0 lb

## 2020-08-09 DIAGNOSIS — E8881 Metabolic syndrome: Secondary | ICD-10-CM | POA: Diagnosis not present

## 2020-08-09 DIAGNOSIS — E669 Obesity, unspecified: Secondary | ICD-10-CM | POA: Diagnosis not present

## 2020-08-09 DIAGNOSIS — E7849 Other hyperlipidemia: Secondary | ICD-10-CM

## 2020-08-09 DIAGNOSIS — Z6831 Body mass index (BMI) 31.0-31.9, adult: Secondary | ICD-10-CM

## 2020-08-10 NOTE — Progress Notes (Signed)
Chief Complaint:   [name] is here to discuss her progress with her Hannah treatment plan along with follow-up of her Hannah related diagnoses. [name] is on the Category 3 Plan and states she is following her eating plan approximately 98% of the time. [name] states she is doing HIIT/strengthening 30-60 minutes 6 times per week.  Today's visit was #: 6 Starting weight: 199 lbs Starting date: 05/31/2020 Today's weight: 189 lbs Today's date: 08/09/2020 Total lbs lost to date: 10 Total lbs lost since last in-office visit: 3  Interim History: [name] states that she has been eating egg wraps and yogurt for breakfast, microwave meals or sandwiches and yogurt for lunch, and using the "Eating Out" sheet for dinner when needed. She is cooking at home more often.  Subjective:   Other hyperlipidemia. [name] has hyperlipidemia and has been trying to improve her cholesterol levels with intensive lifestyle modification including a low saturated fat diet, exercise and weight loss. She denies any chest pain. [name] is on no medication. She is exercising regularly.  Lab Results  Component Value Date   ALT 19 05/31/2020   AST 19 05/31/2020   ALKPHOS 77 05/31/2020   BILITOT 0.2 05/31/2020   Lab Results  Component Value Date   CHOL 219 (H) 05/31/2020   HDL 57 05/31/2020   LDLCALC 144 (H) 05/31/2020   TRIG 103 05/31/2020   Insulin resistance. [name] has a diagnosis of insulin resistance based on her elevated fasting insulin level >5. She continues to work on diet and exercise to decrease her risk of diabetes. [name] is on no medication. No polyphagia.  Lab Results  Component Value Date   INSULIN 13.6 05/31/2020   Lab Results  Component Value Date   HGBA1C 5.0 05/31/2020   Assessment/Plan:   Other hyperlipidemia.  Cardiovascular risk and specific lipid/LDL goals reviewed.  We discussed several lifestyle modifications today and [name] will continue to work on diet, exercise and  weight loss efforts. Orders and follow up as documented in patient record.   Counseling Intensive lifestyle modifications are the first line treatment for this issue. . Dietary changes: Increase soluble fiber. Decrease simple carbohydrates. . Exercise changes: Moderate to vigorous-intensity aerobic activity 150 minutes per week if tolerated. . Lipid-lowering medications: see documented in medical record.  Insulin resistance. [name] will continue to work on weight loss, exercise, and decreasing simple carbohydrates to help decrease the risk of diabetes. [name] agreed to follow-up with [name] as directed to closely monitor her progress.  Class 1 Hannah with serious comorbidity and body mass index (BMI) of 31.0 to 31.9 in adult, unspecified Hannah type.  [name] is currently in the action stage of change. As such, her goal is to continue with weight loss efforts. She has agreed to keeping a food journal and adhering to recommended goals of 1500-1600 calories and 100 grams of protein daily.   Exercise goals: For substantial health benefits, adults should do at least 150 minutes (2 hours and 30 minutes) a week of moderate-intensity, or 75 minutes (1 hour and 15 minutes) a week of vigorous-intensity aerobic physical activity, or an equivalent combination of moderate- and vigorous-intensity aerobic activity. Aerobic activity should be performed in episodes of at least 10 minutes, and preferably, it should be spread throughout the week.  Behavioral modification strategies: meal planning and cooking strategies and planning for success.  [name] has agreed to follow-up with our clinic in 2 weeks. She was informed of the importance of frequent follow-up visits  to maximize her success with intensive lifestyle modifications for her multiple health conditions.   Objective:   Blood pressure 122/81, pulse 79, temperature 98 F (36.7 C), temperature source Oral, height 5\' 5"  (1.651 m), weight 189 lb (85.7 kg), SpO2 99  %. Body mass index is 31.45 kg/m.  General: Cooperative, alert, well developed, in no acute distress. HEENT: Conjunctivae and lids unremarkable. Cardiovascular: Regular rhythm.  Lungs: Normal work of breathing. Neurologic: No focal deficits.   Lab Results  Component Value Date   CREATININE 0.88 05/31/2020   BUN 10 05/31/2020   NA 140 05/31/2020   K 4.3 05/31/2020   CL 100 05/31/2020   CO2 25 05/31/2020   Lab Results  Component Value Date   ALT 19 05/31/2020   AST 19 05/31/2020   ALKPHOS 77 05/31/2020   BILITOT 0.2 05/31/2020   Lab Results  Component Value Date   HGBA1C 5.0 05/31/2020   Lab Results  Component Value Date   INSULIN 13.6 05/31/2020   Lab Results  Component Value Date   TSH 1.710 05/31/2020   Lab Results  Component Value Date   CHOL 219 (H) 05/31/2020   HDL 57 05/31/2020   LDLCALC 144 (H) 05/31/2020   TRIG 103 05/31/2020   Lab Results  Component Value Date   WBC 6.9 05/31/2020   HGB 14.5 05/31/2020   HCT 43.5 05/31/2020   MCV 91 05/31/2020   PLT 322 05/31/2020   Lab Results  Component Value Date   IRON 57 02/24/2019   FERRITIN 30.7 02/24/2019   Attestation Statements:   Reviewed by clinician on day of visit: allergies, medications, problem list, medical history, surgical history, family history, social history, and previous encounter notes.  Time spent on visit including pre-visit chart review and post-visit charting and care was 30 minutes.   I02/27/2020, am acting as transcriptionist for Marianna Payment, PA-C   I have reviewed the above documentation for accuracy and completeness, and I agree with the above. Alois Cliche, PA-C

## 2020-08-12 ENCOUNTER — Other Ambulatory Visit: Payer: Self-pay | Admitting: *Deleted

## 2020-08-12 MED ORDER — SRONYX 0.1-20 MG-MCG PO TABS: 1.0000 | ORAL_TABLET | Freq: Every day | ORAL | 2 refills | Status: DC

## 2020-08-15 ENCOUNTER — Other Ambulatory Visit: Payer: Self-pay

## 2020-08-15 ENCOUNTER — Telehealth (INDEPENDENT_AMBULATORY_CARE_PROVIDER_SITE_OTHER): Payer: 59 | Admitting: Psychology

## 2020-08-15 DIAGNOSIS — F5089 Other specified eating disorder: Secondary | ICD-10-CM

## 2020-08-24 ENCOUNTER — Other Ambulatory Visit: Payer: Self-pay | Admitting: Obstetrics and Gynecology

## 2020-08-24 ENCOUNTER — Ambulatory Visit (INDEPENDENT_AMBULATORY_CARE_PROVIDER_SITE_OTHER): Payer: 59 | Admitting: Physician Assistant
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
## 2020-08-25 ENCOUNTER — Encounter (INDEPENDENT_AMBULATORY_CARE_PROVIDER_SITE_OTHER): Payer: Self-pay | Admitting: Physician Assistant

## 2020-08-25 ENCOUNTER — Other Ambulatory Visit: Payer: Self-pay

## 2020-08-25 ENCOUNTER — Ambulatory Visit (INDEPENDENT_AMBULATORY_CARE_PROVIDER_SITE_OTHER): Payer: 59 | Admitting: Physician Assistant

## 2020-08-25 VITALS — BP 141/83 | HR 76 | Temp 98.0°F | Ht 65.0 in | Wt 190.0 lb

## 2020-08-25 DIAGNOSIS — E8881 Metabolic syndrome: Secondary | ICD-10-CM

## 2020-08-25 DIAGNOSIS — E669 Obesity, unspecified: Secondary | ICD-10-CM | POA: Diagnosis not present

## 2020-08-25 DIAGNOSIS — Z6831 Body mass index (BMI) 31.0-31.9, adult: Secondary | ICD-10-CM | POA: Diagnosis not present

## 2020-08-25 DIAGNOSIS — E66811 Obesity, class 1: Secondary | ICD-10-CM

## 2020-08-25 DIAGNOSIS — E88819 Insulin resistance, unspecified: Secondary | ICD-10-CM

## 2020-08-25 DIAGNOSIS — E038 Other specified hypothyroidism: Secondary | ICD-10-CM

## 2020-08-29 NOTE — Progress Notes (Signed)
Chief Complaint:   OBESITY [name] is here to discuss her progress with her obesity treatment plan along with follow-up of her obesity related diagnoses. [name] is keeping a food journal and adhering to recommended goals of 1500-1600 calories and 100 grams of protein and states she is following her eating plan approximately 90% of the time. [name] states she is strengthening/HIIT/cardio 60 minutes 6 times per week.  Today's visit was #: 7 Starting weight: 199 lbs Starting date: 05/31/2020 Today's weight: 190 lbs Today's date: 08/25/2020 Total lbs lost to date: 9 Total lbs lost since last in-office visit: 0  Interim History: [name] reports that she did not journal consistently the last 2 weeks. She is working out 6 days a week for an hour and would like to do more. She denies excessive hunger.  Subjective:   Other specified hypothyroidism. [name] is on levothyroxine. She denies excessive fatigue.   Lab Results  Component Value Date   TSH 1.710 05/31/2020   Insulin resistance. [name] has a diagnosis of insulin resistance based on her elevated fasting insulin level >5. She continues to work on diet and exercise to decrease her risk of diabetes. [name] is on no medications and denies polyphagia. She is exercising regularly.  Lab Results  Component Value Date   INSULIN 13.6 05/31/2020   Lab Results  Component Value Date   HGBA1C 5.0 05/31/2020   Assessment/Plan:   Other specified hypothyroidism. Patient with long-standing hypothyroidism, on levothyroxine therapy. She appears euthyroid. Orders and follow up as documented in patient record. Jizelle will continue levothyroxine as directed.   Counseling . Good thyroid control is important for overall health. Supratherapeutic thyroid levels are dangerous and will not improve weight loss results. . The correct way to take levothyroxine is fasting, with water, separated by at least 30 minutes from breakfast, and separated by more than  4 hours from calcium, iron, multivitamins, acid reflux medications (PPIs).   Insulin resistance. [name] will continue to work on weight loss, exercise, and decreasing simple carbohydrates to help decrease the risk of diabetes. [name] agreed to follow-up with [name] as directed to closely monitor her progress.  Class 1 obesity with serious comorbidity and body mass index (BMI) of 31.0 to 31.9 in adult, unspecified obesity type.  [name] is currently in the action stage of change. As such, her goal is to continue with weight loss efforts. She has agreed to keeping a food journal and adhering to recommended goals of 1600-1700 calories and 135 grams of protein daily.   Exercise goals: For substantial health benefits, adults should do at least 150 minutes (2 hours and 30 minutes) a week of moderate-intensity, or 75 minutes (1 hour and 15 minutes) a week of vigorous-intensity aerobic physical activity, or an equivalent combination of moderate- and vigorous-intensity aerobic activity. Aerobic activity should be performed in episodes of at least 10 minutes, and preferably, it should be spread throughout the week.  Behavioral modification strategies: increasing lean protein intake, planning for success and keeping a strict food journal.  [name] has agreed to follow-up with our clinic in 2-3 weeks. She was informed of the importance of frequent follow-up visits to maximize her success with intensive lifestyle modifications for her multiple health conditions.   Objective:   Blood pressure (!) 141/83, pulse 76, temperature 98 F (36.7 C), temperature source Oral, height 5\' 5"  (1.651 m), weight 190 lb (86.2 kg), SpO2 98 %. Body mass index is 31.62 kg/m.  General: Cooperative, alert, well developed,  in no acute distress. HEENT: Conjunctivae and lids unremarkable. Cardiovascular: Regular rhythm.  Lungs: Normal work of breathing. Neurologic: No focal deficits.   Lab Results  Component Value Date   CREATININE  0.88 05/31/2020   BUN 10 05/31/2020   NA 140 05/31/2020   K 4.3 05/31/2020   CL 100 05/31/2020   CO2 25 05/31/2020   Lab Results  Component Value Date   ALT 19 05/31/2020   AST 19 05/31/2020   ALKPHOS 77 05/31/2020   BILITOT 0.2 05/31/2020   Lab Results  Component Value Date   HGBA1C 5.0 05/31/2020   Lab Results  Component Value Date   INSULIN 13.6 05/31/2020   Lab Results  Component Value Date   TSH 1.710 05/31/2020   Lab Results  Component Value Date   CHOL 219 (H) 05/31/2020   HDL 57 05/31/2020   LDLCALC 144 (H) 05/31/2020   TRIG 103 05/31/2020   Lab Results  Component Value Date   WBC 6.9 05/31/2020   HGB 14.5 05/31/2020   HCT 43.5 05/31/2020   MCV 91 05/31/2020   PLT 322 05/31/2020   Lab Results  Component Value Date   IRON 57 02/24/2019   FERRITIN 30.7 02/24/2019   Attestation Statements:   Reviewed by clinician on day of visit: allergies, medications, problem list, medical history, surgical history, family history, social history, and previous encounter notes.  Time spent on visit including pre-visit chart review and post-visit charting and care was 30 minutes.   IMarianna Payment, am acting as transcriptionist for Alois Cliche, PA-C   I have reviewed the above documentation for accuracy and completeness, and I agree with the above. Alois Cliche, PA-C

## 2020-08-31 ENCOUNTER — Other Ambulatory Visit: Payer: Self-pay | Admitting: Endocrinology

## 2020-09-07 ENCOUNTER — Telehealth (INDEPENDENT_AMBULATORY_CARE_PROVIDER_SITE_OTHER): Payer: 59 | Admitting: Psychology

## 2020-09-07 DIAGNOSIS — F5089 Other specified eating disorder: Secondary | ICD-10-CM

## 2020-09-15 ENCOUNTER — Encounter (INDEPENDENT_AMBULATORY_CARE_PROVIDER_SITE_OTHER): Payer: Self-pay | Admitting: Physician Assistant

## 2020-09-15 ENCOUNTER — Ambulatory Visit (INDEPENDENT_AMBULATORY_CARE_PROVIDER_SITE_OTHER): Payer: 59 | Admitting: Physician Assistant

## 2020-09-15 ENCOUNTER — Other Ambulatory Visit: Payer: Self-pay

## 2020-09-15 VITALS — BP 114/70 | HR 65 | Temp 98.3°F | Ht 65.0 in | Wt 188.0 lb

## 2020-09-15 DIAGNOSIS — Z6831 Body mass index (BMI) 31.0-31.9, adult: Secondary | ICD-10-CM | POA: Diagnosis not present

## 2020-09-15 DIAGNOSIS — E7849 Other hyperlipidemia: Secondary | ICD-10-CM | POA: Diagnosis not present

## 2020-09-15 DIAGNOSIS — E669 Obesity, unspecified: Secondary | ICD-10-CM | POA: Diagnosis not present

## 2020-09-15 DIAGNOSIS — E8881 Metabolic syndrome: Secondary | ICD-10-CM

## 2020-09-15 DIAGNOSIS — E66811 Obesity, class 1: Secondary | ICD-10-CM

## 2020-09-15 DIAGNOSIS — E88819 Insulin resistance, unspecified: Secondary | ICD-10-CM

## 2020-09-18 NOTE — Progress Notes (Signed)
Chief Complaint:   OBESITY [name] is here to discuss her progress with her obesity treatment plan along with follow-up of her obesity related diagnoses. [name] is on keeping a food journal and adhering to recommended goals of 1600-1700 calories and 135 grams of protein daily and states she is following her eating plan approximately 95% of the time. [name] states she is doing strengthening and cardio for 60 minutes 5 times per week.  Today's visit was #: 8 Starting weight: 199 lbs Starting date: 05/31/2020 Today's weight: 188 lbs Today's date: 09/15/2020 Total lbs lost to date: 11 Total lbs lost since last in-office visit: 2  Interim History: [name] reports that she has finally found a rhythm with her dinners. She is meal planning better overall. She is using her snack calories for food and fruit condiments.  Subjective:   1. Insulin resistance [name] is not on medications, and her hunger is controlled. She is exercising regularly.  2. Other hyperlipidemia [name] is not on medications, and she is exercising regularly.  Assessment/Plan:   1. Insulin resistance [name] will continue her meal plan, and will continue to work on weight loss, exercise, and decreasing simple carbohydrates to help decrease the risk of diabetes.  [name] agreed to follow-up with [name] as directed to closely monitor her progress.  2. Other hyperlipidemia Cardiovascular risk and specific lipid/LDL goals reviewed. We discussed several lifestyle modifications today. [name] will continue her meal plan, and will continue to work on exercise and weight loss efforts. Orders and follow up as documented in patient record.   Counseling Intensive lifestyle modifications are the first line treatment for this issue. . Dietary changes: Increase soluble fiber. Decrease simple carbohydrates. . Exercise changes: Moderate to vigorous-intensity aerobic activity 150 minutes per week if tolerated. . Lipid-lowering medications: see  documented in medical record.  3. Class 1 obesity with serious comorbidity and body mass index (BMI) of 31.0 to 31.9 in adult, unspecified obesity type [name] is currently in the action stage of change. As such, her goal is to continue with weight loss efforts. She has agreed to keeping a food journal and adhering to recommended goals of 1500-1600 calories and 120 grams of protein.   Exercise goals: As is.  Behavioral modification strategies: celebration eating strategies and avoiding temptations.  [name] has agreed to follow-up with our clinic in 3 weeks. She was informed of the importance of frequent follow-up visits to maximize her success with intensive lifestyle modifications for her multiple health conditions.   Objective:   Blood pressure 114/70, pulse 65, temperature 98.3 F (36.8 C), height 5\' 5"  (1.651 m), weight 188 lb (85.3 kg), SpO2 100 %. Body mass index is 31.28 kg/m.  General: Cooperative, alert, well developed, in no acute distress. HEENT: Conjunctivae and lids unremarkable. Cardiovascular: Regular rhythm.  Lungs: Normal work of breathing. Neurologic: No focal deficits.   Lab Results  Component Value Date   CREATININE 0.88 05/31/2020   BUN 10 05/31/2020   NA 140 05/31/2020   K 4.3 05/31/2020   CL 100 05/31/2020   CO2 25 05/31/2020   Lab Results  Component Value Date   ALT 19 05/31/2020   AST 19 05/31/2020   ALKPHOS 77 05/31/2020   BILITOT 0.2 05/31/2020   Lab Results  Component Value Date   HGBA1C 5.0 05/31/2020   Lab Results  Component Value Date   INSULIN 13.6 05/31/2020   Lab Results  Component Value Date   TSH 1.710 05/31/2020   Lab Results  Component Value Date   CHOL 219 (H) 05/31/2020   HDL 57 05/31/2020   LDLCALC 144 (H) 05/31/2020   TRIG 103 05/31/2020   Lab Results  Component Value Date   WBC 6.9 05/31/2020   HGB 14.5 05/31/2020   HCT 43.5 05/31/2020   MCV 91 05/31/2020   PLT 322 05/31/2020   Lab Results  Component Value  Date   IRON 57 02/24/2019   FERRITIN 30.7 02/24/2019   Attestation Statements:   Reviewed by clinician on day of visit: allergies, medications, problem list, medical history, surgical history, family history, social history, and previous encounter notes.  Time spent on visit including pre-visit chart review and post-visit care and charting was 30 minutes.    Trude Mcburney, am acting as transcriptionist for [employer], PA-C.  I have reviewed the above documentation for accuracy and completeness, and I agree with the above. -  *Alois Cliche, PA-C

## 2020-09-21 NOTE — Progress Notes (Signed)
Office: [phone]  /  Fax: [phone]    Date: October 05, 2020   Appointment Start Time: 10:00am Duration: 25 minutes Provider: Lawerance Cruel, Psy.D. Type of Session: Individual Therapy  Location of Patient: Home Location of Provider: Provider's Home Type of Contact: Telepsychological Visit via MyChart Video Visit  Session Content: Djeneba is a 29 y.o. female presenting for a follow-up appointment to address the previously established treatment goal of increasing coping skills. Today's appointment was a telepsychological visit due to COVID-19. Tionne provided verbal consent for today's telepsychological appointment and she is aware she is responsible for securing confidentiality on her end of the session. Prior to proceeding with today's appointment, Symphonie's physical location at the time of this appointment was obtained as well a phone number she could be reached at in the event of technical difficulties. Joanna and this provider participated in today's telepsychological service.   This provider conducted a brief check-in and verbally administered the PHQ-9 and GAD-7. Jeslin shared she continues to lose weight and described making better choices and engaging in portion control when celebrating. Positive reinforcement was provided. A plan was developed to help Kyler cope with emotional eating in the future using learned skills. She wrote down the following plan: focus on hydration; be prepared with snacks congruent to the meal plan; pause to ask questions when triggered to eat (e.g., Am I really hungry?, Is there something bothering me?, and Will I feel better if I eat?); and engage in discussed coping strategies after going through the aforementioned questions. Tangela was receptive to today's appointment as evidenced by openness to sharing, responsiveness to feedback, and willingness to continue engaging in learned skills.  Mental Status Examination:  Appearance: well groomed and appropriate  hygiene  Behavior: appropriate to circumstances Mood: euthymic Affect: mood congruent Speech: normal in rate, volume, and tone Eye Contact: appropriate Psychomotor Activity: appropriate Gait: unable to assess Thought Process: linear, logical, and goal directed  Thought Content/Perception: no hallucinations, delusions, bizarre thinking or behavior reported or observed and no evidence of suicidal and homicidal ideation, plan, and intent Orientation: time, person, place, and purpose of appointment Memory/Concentration: memory, attention, language, and fund of knowledge intact  Insight/Judgment: good  Structured Assessments Results: The Patient Health Questionnaire-9 (PHQ-9) is a self-report measure that assesses symptoms and severity of depression over the course of the last two weeks. Abbigal obtained a score of 0. [0= Not at all; 1= Several days; 2= More than half the days; 3= Nearly every day] Little interest or pleasure in doing things 0  Feeling down, depressed, or hopeless 0  Trouble falling or staying asleep, or sleeping too much 0  Feeling tired or having little energy 0  Poor appetite or overeating 0  Feeling bad about yourself --- or that you are a failure or have let yourself or your family down 0  Trouble concentrating on things, such as reading the newspaper or watching television 0  Moving or speaking so slowly that other people could have noticed? Or the opposite --- being so fidgety or restless that you have been moving around a lot more than usual 0  Thoughts that you would be better off dead or hurting yourself in some way 0  PHQ-9 Score 0    The Generalized Anxiety Disorder-7 (GAD-7) is a brief self-report measure that assesses symptoms of anxiety over the course of the last two weeks. Mignonne obtained a score of 0. [0= Not at all; 1= Several days; 2= Over half the days; 3= Nearly  every day] Feeling nervous, anxious, on edge 0  Not being able to stop or control worrying 0    Worrying too much about different things 0  Trouble relaxing 0  Being so restless that it's hard to sit still 0  Becoming easily annoyed or irritable 0  Feeling afraid as if something awful might happen 0  GAD-7 Score 0   Interventions:  Conducted a brief chart review Verbally administered PHQ-9 and GAD-7 for symptom monitoring Provided empathic reflections and validation Employed supportive psychotherapy interventions to facilitate reduced distress and to improve coping skills with identified stressors Reviewed learned skills  DSM-5 Diagnosis(es): 307.59 (F50.8) Other Specified Feeding or Eating Disorder, Emotional Eating Behaviors  Treatment Goal & Progress: During the initial appointment with this provider, the following treatment goal was established: increase coping skills. Tommy demonstrated progress in her goal as evidenced by increased awareness of hunger patterns, increased awareness of triggers for emotional eating and reduction in emotional eating. Carrol also continues to demonstrate willingness to engage in learned skill(s).  Plan: As previously planned, today was Shalunda's last appointment with this provider. She acknowledged understanding that she may request a follow-up appointment with this provider in the future as long as she is still established with the clinic. Notably, this provider discussed her upcoming maternity leave toward the end of November. Iris acknowledged understanding given the uncertain nature of the circumstances, this provider may be out of the office sooner. This provider and Renad discussed referral options in the event she wanted to re-initiate therapeutic services while this provider was out on maternity leave, and verbal consent was provided for this provider to send a list of referral options via e-mail. All questions/concerns were addressed. Quadasia denied any concerns. No further follow-up planned by this provider.

## 2020-09-29 ENCOUNTER — Other Ambulatory Visit: Payer: Self-pay | Admitting: Endocrinology

## 2020-10-03 ENCOUNTER — Ambulatory Visit (INDEPENDENT_AMBULATORY_CARE_PROVIDER_SITE_OTHER): Payer: 59 | Admitting: Adult Health

## 2020-10-03 ENCOUNTER — Other Ambulatory Visit: Payer: Self-pay

## 2020-10-03 VITALS — BP 109/71 | HR 69 | Temp 98.3°F | Ht 65.0 in | Wt 184.0 lb

## 2020-10-03 DIAGNOSIS — Z6830 Body mass index (BMI) 30.0-30.9, adult: Secondary | ICD-10-CM

## 2020-10-03 DIAGNOSIS — E8881 Metabolic syndrome: Secondary | ICD-10-CM

## 2020-10-03 DIAGNOSIS — E669 Obesity, unspecified: Secondary | ICD-10-CM | POA: Diagnosis not present

## 2020-10-03 DIAGNOSIS — E782 Mixed hyperlipidemia: Secondary | ICD-10-CM

## 2020-10-04 DIAGNOSIS — E782 Mixed hyperlipidemia: Secondary | ICD-10-CM | POA: Insufficient documentation

## 2020-10-04 NOTE — Progress Notes (Signed)
Chief Complaint:   [name] is here to discuss her progress with her Hannah treatment plan along with follow-up of her Hannah related diagnoses. Jaretssi is keeping a food journal and adhering to recommended goals of 1500-1600 calories and 120 grams of protein and states she is following her eating plan approximately 80% of the time. Aarti states she is doing cardio/strengthening 60 minutes 4-5 times per week.  Today's visit was #: 9 Starting weight: 199 lbs Starting date: 05/31/2020 Today's weight: 184 lbs Today's date: 10/03/2020 Total lbs lost to date: 15 Total lbs lost since last in-office visit: 4  Interim History: In the last 2 weeks Lovelyn celebrated a birthday and she assisted with 2 weddings. She followed the plan by focusing on protein at each meal and snacks. She is thrilled with her consistent progress on the journaling plan. She will be traveling to [place] at the end of the week and plans on meal planning/prepping while there.  Subjective:   Insulin resistance. Zanaya has a diagnosis of insulin resistance based on her elevated fasting insulin level >5. She continues to work on diet and exercise to decrease her risk of diabetes. 05/31/2020 blood glucose 79, A1c 5.0 with an insulin level of 13.6. Shelby is not on metformin.  Lab Results  Component Value Date   INSULIN 13.6 05/31/2020   Lab Results  Component Value Date   HGBA1C 5.0 05/31/2020   Mixed hyperlipidemia. Toluwanimi has hyperlipidemia and has been trying to improve her cholesterol levels with intensive lifestyle modification including a low saturated fat diet, exercise and weight loss. She denies any chest pain, claudication or myalgias. 05/31/2020 total cholesterol 219, HDL 57, triglycerides 103, and LDL 144. Laporcha is not on a statin.  Lab Results  Component Value Date   ALT 19 05/31/2020   AST 19 05/31/2020   ALKPHOS 77 05/31/2020   BILITOT 0.2 05/31/2020   Lab Results  Component Value  Date   CHOL 219 (H) 05/31/2020   HDL 57 05/31/2020   LDLCALC 144 (H) 05/31/2020   TRIG 103 05/31/2020   Assessment/Plan:   Insulin resistance. Babe will continue to work on weight loss, exercise, and decreasing simple carbohydrates to help decrease the risk of diabetes. Karthika agreed to follow-up with [name] as directed to closely monitor her progress. She will continue to increase her protein intake. Labs will be checked at her next office visit.  Mixed hyperlipidemia. Cardiovascular risk and specific lipid/LDL goals reviewed.  We discussed several lifestyle modifications today and Rindi will continue to work on diet, exercise and weight loss efforts. Orders and follow up as documented in patient record. She will continue to limit saturated fat and exercise regularly. Labs will be checked at her next office visit.  Counseling Intensive lifestyle modifications are the first line treatment for this issue. . Dietary changes: Increase soluble fiber. Decrease simple carbohydrates. . Exercise changes: Moderate to vigorous-intensity aerobic activity 150 minutes per week if tolerated. . Lipid-lowering medications: see documented in medical record.  Class 1 Hannah with serious comorbidity and body mass index (BMI) of 30.0 to 30.9 in adult, unspecified Hannah type.  Jaycey is currently in the action stage of change. As such, her goal is to continue with weight loss efforts. She has agreed to keeping a food journal and adhering to recommended goals of 1500-1600 calories and 120 grams of protein daily.  Exercise goals: Amadi will continue her current exercise regimen.   Behavioral modification strategies: increasing  lean protein intake, decreasing simple carbohydrates, no skipping meals, meal planning and cooking strategies, travel eating strategies, celebration eating strategies, planning for success and keeping a strict food journal.  Calysta has agreed to follow-up with our clinic fasting in 2 weeks.  She was informed of the importance of frequent follow-up visits to maximize her success with intensive lifestyle modifications for her multiple health conditions.   Objective:   Blood pressure 109/71, pulse 69, temperature 98.3 F (36.8 C), height 5\' 5"  (1.651 m), weight 184 lb (83.5 kg), SpO2 98 %. Body mass index is 30.62 kg/m.  General: Cooperative, alert, well developed, in no acute distress. HEENT: Conjunctivae and lids unremarkable. Cardiovascular: Regular rhythm.  Lungs: Normal work of breathing. Neurologic: No focal deficits.   Lab Results  Component Value Date   CREATININE 0.88 05/31/2020   BUN 10 05/31/2020   NA 140 05/31/2020   K 4.3 05/31/2020   CL 100 05/31/2020   CO2 25 05/31/2020   Lab Results  Component Value Date   ALT 19 05/31/2020   AST 19 05/31/2020   ALKPHOS 77 05/31/2020   BILITOT 0.2 05/31/2020   Lab Results  Component Value Date   HGBA1C 5.0 05/31/2020   Lab Results  Component Value Date   INSULIN 13.6 05/31/2020   Lab Results  Component Value Date   TSH 1.710 05/31/2020   Lab Results  Component Value Date   CHOL 219 (H) 05/31/2020   HDL 57 05/31/2020   LDLCALC 144 (H) 05/31/2020   TRIG 103 05/31/2020   Lab Results  Component Value Date   WBC 6.9 05/31/2020   HGB 14.5 05/31/2020   HCT 43.5 05/31/2020   MCV 91 05/31/2020   PLT 322 05/31/2020   Lab Results  Component Value Date   IRON 57 02/24/2019   FERRITIN 30.7 02/24/2019   Attestation Statements:   Reviewed by clinician on day of visit: allergies, medications, problem list, medical history, surgical history, family history, social history, and previous encounter notes.  Time spent on visit including pre-visit chart review and post-visit charting and care was 27 minutes.   I, 02/26/2019, am acting as Marianna [employer] for [employer] manager, NP-C   I have reviewed the above documentation for accuracy and completeness, and I agree with the above. -  [employer], NP

## 2020-10-05 ENCOUNTER — Telehealth (INDEPENDENT_AMBULATORY_CARE_PROVIDER_SITE_OTHER): Payer: 59 | Admitting: Psychology

## 2020-10-05 DIAGNOSIS — F5089 Other specified eating disorder: Secondary | ICD-10-CM

## 2020-10-12 ENCOUNTER — Other Ambulatory Visit: Payer: Self-pay

## 2020-10-12 ENCOUNTER — Other Ambulatory Visit (INDEPENDENT_AMBULATORY_CARE_PROVIDER_SITE_OTHER): Payer: 59

## 2020-10-12 DIAGNOSIS — E063 Autoimmune thyroiditis: Secondary | ICD-10-CM | POA: Diagnosis not present

## 2020-10-12 LAB — TSH: TSH: 1.68 u[IU]/mL (ref 0.35–4.50)

## 2020-10-12 LAB — T4, FREE: Free T4: 0.74 ng/dL (ref 0.60–1.60)

## 2020-10-17 ENCOUNTER — Telehealth (INDEPENDENT_AMBULATORY_CARE_PROVIDER_SITE_OTHER): Payer: 59 | Admitting: Endocrinology

## 2020-10-17 ENCOUNTER — Encounter: Payer: Self-pay | Admitting: Endocrinology

## 2020-10-17 ENCOUNTER — Other Ambulatory Visit: Payer: Self-pay

## 2020-10-17 VITALS — Ht 65.0 in | Wt 186.0 lb

## 2020-10-17 DIAGNOSIS — E063 Autoimmune thyroiditis: Secondary | ICD-10-CM | POA: Diagnosis not present

## 2020-10-17 NOTE — Progress Notes (Signed)
Patient ID: [name], female   DOB: 21-Jul-1991, 29 y.o.   MRN: [id number]             Reason for Appointment:  Hypothyroidism, follow-up visit   Today's office visit was provided via telemedicine using video technique Explained to the patient and the the limitations of evaluation and management by telemedicine and the availability of in person appointments.  The patient understood the limitations and agreed to proceed. Patient also understood that the telehealth visit is billable. . Location of the patient: Home . Location of the provider: Office Only the patient and myself were participating in the encounter     History of Present Illness:   Hypothyroidism was first diagnosed in 9/17  Patient has had symptoms of fatigue for about 4 years.  This had been fairly consistent and not significantly more recently She also has had an overall weight gain of about 40 pounds although this has been variable and at one point about 2 years ago had lost about 20 pounds with diet and exercise She has had some tendency to hair loss over the last year along with difficulty concentrating and memory Does have mild cold sensitivity but more in her feet  In 9/17 her gynecologist checked her TSH and it was about 6.  RECENT HISTORY: She has been on levothyroxine daily since 10/2016, initially 25 g and subsequently higher doses  Initially with starting the levothyroxine she felt a little better with energy level and also had more motivation and better focusing.   When TSH was high normal  her dose was increased to 37.5 g in February 2018 This was again increased up to 50 mcg in 10/2019 when TSH was 3.3  On her last visit in 3/21 she had no symptoms of fatigue or difficulty with concentration Her only concern at that time was weight gain  She is again feeling fairly good; overall better since she has been able to focus on her diet better and deal with stress She is having good energy level and  probably has lost 15 pounds No recent hair loss  She is consistent with taking her levothyroxine around 6 AM daily with water She is taking the 50 mcg prescription every day now  TSH is stable at 1.7  Patient's weight history is as follows:  Wt Readings from Last 3 Encounters:  10/17/20 186 lb (84.4 kg)  10/03/20 184 lb (83.5 kg)  09/15/20 188 lb (85.3 kg)    Thyroid function results have been as follows:   TSH at baseline done in 9/17 from outside lab was 6  Lab Results  Component Value Date   TSH 1.68 10/12/2020   TSH 1.710 05/31/2020   TSH 2.27 02/26/2020   FREET4 0.74 10/12/2020   FREET4 1.13 05/31/2020   FREET4 0.82 02/26/2020      Past Medical History:  Diagnosis Date  . Fatty liver   . Headache    Migraines  . History of chicken pox   . Hypothyroidism   . IBS (irritable bowel syndrome)   . Microscopic colitis   . PCOS (polycystic ovarian syndrome)   . Urinary tract infection   . Vertigo     Past Surgical History:  Procedure Laterality Date  . LAPAROSCOPIC OVARIAN CYSTECTOMY Bilateral 07/08/2019   Procedure: LAPAROSCOPIC OVARIAN CYSTECTOMY with peritoneal biopsies;  Surgeon: Allie Bossier, MD;  Location: [facility];  Service: Gynecology;  Laterality: Bilateral;  . TONSILLECTOMY     adenoidectomy  .  WISDOM TOOTH EXTRACTION      Family History  Problem Relation Age of Onset  . Diabetes Maternal Grandmother   . Breast cancer Maternal Grandmother   . Parkinson's disease Maternal Grandmother   . Thyroid disease Father   . Sleep apnea Mother   . Obesity Mother   . Kidney disease Paternal Grandmother   . Thyroid disease Paternal Grandmother   . Healthy Brother   . Healthy Daughter   . Anesthesia problems Neg Hx     Social History:  reports that she has never smoked. She has never used smokeless tobacco. She reports that she does not drink alcohol and does not use drugs.  Allergies:  Allergies  Allergen Reactions  . Erythromycin     . Penicillins Hives    Can tolerate cephalosporins    Allergies as of 10/17/2020      Reactions   Erythromycin    Penicillins Hives   Can tolerate cephalosporins      Medication List       Accurate as of October 17, 2020  4:01 PM. If you have any questions, ask your nurse or doctor.        levothyroxine 50 MCG tablet Commonly known as: SYNTHROID 1 tablet daily, schedule follow-up appointment   Sronyx 0.1-20 MG-MCG tablet Generic drug: levonorgestrel-ethinyl estradiol Take 1 tablet by mouth daily. Skip placebo pills   valACYclovir 1000 MG tablet Commonly known as: VALTREX Take 1 tablet (1,000 mg total) by mouth 2 (two) times daily. What changed:   when to take this  reasons to take this        Review of Systems  She has polycystic ovaries, on birth control pills          Examination:    Ht 5\' 5"  (1.651 m)   Wt 186 lb (84.4 kg)   BMI 30.95 kg/m    No exam done, patient is on a virtual visit   Assessment:  HYPOTHYROIDISM with baseline TSH of 6, symptomatic   She had improved symptomatically with levothyroxine supplementation which has been gradually increased until 2020 Her symptoms have been at baseline nonspecific and may not be all related to hypothyroidism However she did improve subjectively with levothyroxine supplementation  Has had a small goiter on initial exam in 2017  With taking 50 mcg of levothyroxine her TSH is quite stable and below 2.0 now   Weight gain: She has been able to control this with lifestyle changes  PLAN:   Continue 50 mcg of levothyroxine daily Follow-up in 9 months  [name] 10/17/2020, 4:01 PM    Note: This office note was prepared with Dragon voice recognition system technology. Any transcriptional errors that result from this process are unintentional.

## 2020-10-18 ENCOUNTER — Other Ambulatory Visit: Payer: Self-pay | Admitting: Endocrinology

## 2020-10-24 ENCOUNTER — Other Ambulatory Visit: Payer: Self-pay

## 2020-10-24 ENCOUNTER — Ambulatory Visit (INDEPENDENT_AMBULATORY_CARE_PROVIDER_SITE_OTHER): Payer: 59 | Admitting: Adult Health

## 2020-10-24 ENCOUNTER — Encounter (INDEPENDENT_AMBULATORY_CARE_PROVIDER_SITE_OTHER): Payer: Self-pay | Admitting: Adult Health

## 2020-10-24 VITALS — BP 115/73 | HR 65 | Temp 98.2°F | Ht 65.0 in | Wt 186.0 lb

## 2020-10-24 DIAGNOSIS — R7401 Elevation of levels of liver transaminase levels: Secondary | ICD-10-CM

## 2020-10-24 DIAGNOSIS — E782 Mixed hyperlipidemia: Secondary | ICD-10-CM | POA: Diagnosis not present

## 2020-10-24 DIAGNOSIS — E8881 Metabolic syndrome: Secondary | ICD-10-CM

## 2020-10-24 DIAGNOSIS — E669 Obesity, unspecified: Secondary | ICD-10-CM | POA: Diagnosis not present

## 2020-10-24 DIAGNOSIS — Z9189 Other specified personal risk factors, not elsewhere classified: Secondary | ICD-10-CM

## 2020-10-24 DIAGNOSIS — R7989 Other specified abnormal findings of blood chemistry: Secondary | ICD-10-CM

## 2020-10-24 DIAGNOSIS — Z6831 Body mass index (BMI) 31.0-31.9, adult: Secondary | ICD-10-CM

## 2020-10-25 LAB — COMPREHENSIVE METABOLIC PANEL
ALT: 16 IU/L (ref 0–32)
AST: 13 IU/L (ref 0–40)
Albumin/Globulin Ratio: 1.6 (ref 1.2–2.2)
Albumin: 4.1 g/dL (ref 3.8–4.8)
Alkaline Phosphatase: 82 IU/L (ref 44–121)
BUN/Creatinine Ratio: 13 (ref 9–23)
BUN: 11 mg/dL (ref 6–20)
Bilirubin Total: <0.2 mg/dL (ref 0.0–1.2)
CO2: 25 mmol/L (ref 20–29)
Calcium: 9.1 mg/dL (ref 8.7–10.2)
Chloride: 105 mmol/L (ref 96–106)
Creatinine, Ser: 0.85 mg/dL (ref 0.57–1.00)
GFR calc Af Amer: 106 mL/min/{1.73_m2} (ref >59)
GFR calc non Af Amer: 92 mL/min/{1.73_m2} (ref >59)
Globulin, Total: 2.6 g/dL (ref 1.5–4.5)
Glucose: 79 mg/dL (ref 65–99)
Potassium: 4.3 mmol/L (ref 3.5–5.2)
Sodium: 141 mmol/L (ref 134–144)
Total Protein: 6.7 g/dL (ref 6.0–8.5)

## 2020-10-25 LAB — LIPID PANEL
Chol/HDL Ratio: 3.5 ratio (ref 0.0–4.4)
Cholesterol, Total: 190 mg/dL (ref 100–199)
HDL: 55 mg/dL (ref >39)
LDL Chol Calc (NIH): 123 mg/dL — ABNORMAL HIGH (ref 0–99)
Triglycerides: 62 mg/dL (ref 0–149)
VLDL Cholesterol Cal: 12 mg/dL (ref 5–40)

## 2020-10-25 LAB — INSULIN, RANDOM: INSULIN: 17.8 u[IU]/mL (ref 2.6–24.9)

## 2020-10-25 NOTE — Progress Notes (Signed)
Chief Complaint:   OBESITY [name] is here to discuss her progress with her obesity treatment plan along with follow-up of her obesity related diagnoses. [name] is keeping a food journal and adhering to recommended goals of 1500-1600 calories and 120 grams of protein and states she is following her eating plan approximately 75% of the time. [name] states she is strengthening/cardio 60 minutes 4 times per week.  Today's visit was #: 10 Starting weight: 199 lbs Starting date: 05/31/2020 Today's weight: 186 lbs Today's date: 10/24/2020 Total lbs lost to date: 13 Total lbs lost since last in-office visit: 0  Interim History: [name] traveled to [place] and to the mountains over the last several weeks. She tried to stay on plan with meal planning/prepping when her schedule allowed, but followed more of a PC/Bode plan most travel days. Now that she is home, she is committed to getting back on track with calorie/protein goals and consistently tracking intake.  Subjective:   Mixed hyperlipidemia. [name] has hyperlipidemia and has been trying to improve her cholesterol levels with intensive lifestyle modification including a low saturated fat diet, exercise and weight loss. She denies any chest pain, claudication or myalgias. 05/31/2020 total and LDL both were above goal. [name] denies cardiac symptoms. No tobacco/vape use. She denies first degree family history of coronary artery disease.  Insulin resistance. [name] has a diagnosis of insulin resistance based on her elevated fasting insulin level >5. She continues to work on diet and exercise to decrease her risk of diabetes. 05/31/2020 blood glucose 79, A1c 5.0 with an insulin level of 13.6. [name] is not on metformin and denies polyphagia.  Lab Results  Component Value Date   INSULIN 13.6 05/31/2020   Lab Results  Component Value Date   HGBA1C 5.0 05/31/2020   Transaminitis. [name] denies right upper quadrant pain.  At risk for  heart disease. [name] is at a higher than average risk for cardiovascular disease due to hyperlipidemia and obesity.   Assessment/Plan:   Mixed hyperlipidemia. Cardiovascular risk and specific lipid/LDL goals reviewed.  We discussed several lifestyle modifications today and [name] will continue to work on diet, exercise and weight loss efforts. Orders and follow up as documented in patient record. Labs will be checked today.  Counseling Intensive lifestyle modifications are the first line treatment for this issue. . Dietary changes: Increase soluble fiber. Decrease simple carbohydrates. . Exercise changes: Moderate to vigorous-intensity aerobic activity 150 minutes per week if tolerated. . Lipid-lowering medications: see documented in medical record.  Insulin resistance. [name] will continue to work on weight loss, exercise, and decreasing simple carbohydrates to help decrease the risk of diabetes. [name] agreed to follow-up with [name] as directed to closely monitor her progress. Labs will be checked today.  Transaminitis.  Labs will be checked today.  At risk for heart disease. [name] was given approximately 15 minutes of coronary artery disease prevention counseling today. She is 29 y.o. female and has risk factors for heart disease including obesity. We discussed intensive lifestyle modifications today with an emphasis on specific weight loss instructions and strategies.   Repetitive spaced learning was employed today to elicit superior memory formation and behavioral change.  Class 1 obesity with serious comorbidity and body mass index (BMI) of 31.0 to 31.9 in adult, unspecified obesity type.  [name] is currently in the action stage of change. As such, her goal is to continue with weight loss efforts. She has agreed to keeping a food journal and adhering  to recommended goals of 1500-1600 calories and 120 grams of protein daily.   Exercise goals: [name] will continue strengthening and cardio 60  minutes 4 times per week.  Behavioral modification strategies: increasing lean protein intake, decreasing simple carbohydrates, increasing water intake, no skipping meals, meal planning and cooking strategies, better snacking choices and planning for success.  [name] has agreed to follow-up with our clinic in 2 weeks. She was informed of the importance of frequent follow-up visits to maximize her success with intensive lifestyle modifications for her multiple health conditions.   [name] was informed we would discuss her lab results at her next visit unless there is a critical issue that needs to be addressed sooner. [name] agreed to keep her next visit at the agreed upon time to discuss these results.  Objective:   Blood pressure 115/73, pulse 65, temperature 98.2 F (36.8 C), height 5\' 5"  (1.651 m), weight 186 lb (84.4 kg), SpO2 98 %. Body mass index is 30.95 kg/m.  General: Cooperative, alert, well developed, in no acute distress. HEENT: Conjunctivae and lids unremarkable. Cardiovascular: Regular rhythm.  Lungs: Normal work of breathing. Neurologic: No focal deficits.   Lab Results  Component Value Date   HGBA1C 5.0 05/31/2020   Lab Results  Component Value Date   INSULIN 13.6 05/31/2020   Lab Results  Component Value Date   TSH 1.68 10/12/2020   Lab Results  Component Value Date   WBC 6.9 05/31/2020   HGB 14.5 05/31/2020   HCT 43.5 05/31/2020   MCV 91 05/31/2020   PLT 322 05/31/2020   Lab Results  Component Value Date   IRON 57 02/24/2019   FERRITIN 30.7 02/24/2019   Attestation Statements:   Reviewed by clinician on day of visit: allergies, medications, problem list, medical history, surgical history, family history, social history, and previous encounter notes.  I, 02/26/2019, am acting as [employer] manager, NP-C   I have reviewed the above documentation for accuracy and completeness, and I agree with the above. -  Daren Yeagle d. Kimaya Whitlatch, NP-C

## 2020-10-28 ENCOUNTER — Encounter: Payer: Self-pay | Admitting: Radiology

## 2020-11-07 ENCOUNTER — Ambulatory Visit (INDEPENDENT_AMBULATORY_CARE_PROVIDER_SITE_OTHER): Payer: 59 | Admitting: Adult Health

## 2020-11-07 ENCOUNTER — Encounter (INDEPENDENT_AMBULATORY_CARE_PROVIDER_SITE_OTHER): Payer: Self-pay | Admitting: Adult Health

## 2020-11-07 ENCOUNTER — Other Ambulatory Visit: Payer: Self-pay

## 2020-11-07 VITALS — BP 126/77 | HR 73 | Temp 98.3°F | Ht 65.0 in | Wt 184.0 lb

## 2020-11-07 DIAGNOSIS — R7401 Elevation of levels of liver transaminase levels: Secondary | ICD-10-CM

## 2020-11-07 DIAGNOSIS — E669 Obesity, unspecified: Secondary | ICD-10-CM | POA: Diagnosis not present

## 2020-11-07 DIAGNOSIS — E8881 Metabolic syndrome: Secondary | ICD-10-CM

## 2020-11-07 DIAGNOSIS — E782 Mixed hyperlipidemia: Secondary | ICD-10-CM

## 2020-11-07 DIAGNOSIS — Z6830 Body mass index (BMI) 30.0-30.9, adult: Secondary | ICD-10-CM

## 2020-11-07 NOTE — Progress Notes (Signed)
Chief Complaint:   OBESITY [name] is here to discuss her progress with her obesity treatment plan along with follow-up of her obesity related diagnoses. [name] is keeping a food journal and adhering to recommended goals of 1500-1600 calories and 120 grams of protein and states she is following her eating plan approximately 80% of the time. [name] states she is doing cardio/strengthening 60 minutes 4 times per week.  Today's visit was #: 11 Starting weight: 199 lbs Starting date: 05/31/2020 Today's weight: 184 lbs Today's date: 11/07/2020 Total lbs lost to date: 15 Total lbs lost since last in-office visit: 2  Interim History: [name] has been tracking intake and hitting calorie and protein goals 80% of the time. She would like to lose 2-4 lbs by the next office visit.  She is very pleased with overall progress in program and reduction in LDL cholesterol.  Subjective:   Mixed hyperlipidemia. [name] has hyperlipidemia and has been trying to improve her cholesterol levels with intensive lifestyle modification including a low saturated fat diet, exercise and weight loss. She denies any chest pain, claudication or myalgias. Lipid panel on 10/24/2020 showed total cholesterol 190, triglycerides 62, HDL 55, and LDL 123 - great reduction from 144 on 05/31/2020. She denies cardiac symptoms. Labs were discussed with the patient today.   Lab Results  Component Value Date   ALT 16 10/24/2020   AST 13 10/24/2020   ALKPHOS 82 10/24/2020   BILITOT <0.2 10/24/2020   Lab Results  Component Value Date   CHOL 190 10/24/2020   HDL 55 10/24/2020   LDLCALC 123 (H) 10/24/2020   TRIG 62 10/24/2020   CHOLHDL 3.5 10/24/2020   Insulin resistance. [name] has a diagnosis of insulin resistance based on her elevated fasting insulin level >5. She continues to work on diet and exercise to decrease her risk of diabetes. 10/24/2020 insulin level 17.8, increased from 13.6 on 05/31/2020. She denies polyphagia.  Labs were discussed with the patient today.   Lab Results  Component Value Date   INSULIN 17.8 10/24/2020   INSULIN 13.6 05/31/2020   Lab Results  Component Value Date   HGBA1C 5.0 05/31/2020   Transaminitis. CMP on 10/24/2020 was stable and liver function tests were within normal limits. She denies left upper quadrant pain. Labs were discussed with the patient today.   Assessment/Plan:   Mixed hyperlipidemia. Cardiovascular risk and specific lipid/LDL goals reviewed.  We discussed several lifestyle modifications today and Nohemi will continue to work on diet, exercise and weight loss efforts. Orders and follow up as documented in patient record. She will continue to reduce saturated fat and continue regular exercise.  Counseling Intensive lifestyle modifications are the first line treatment for this issue. . Dietary changes: Increase soluble fiber. Decrease simple carbohydrates. . Exercise changes: Moderate to vigorous-intensity aerobic activity 150 minutes per week if tolerated. . Lipid-lowering medications: see documented in medical record.  Insulin resistance. [name] will continue to work on weight loss, exercise, and decreasing simple carbohydrates to help decrease the risk of diabetes. [name] agreed to follow-up with [name] as directed to closely monitor her progress. She will continue to increase protein intake at each meal and continue regular exercise.  Transaminitis. Will monitor labs.  Class 1 obesity with serious comorbidity and body mass index (BMI) of 30.0 to 30.9 in adult, unspecified obesity type.  [name] is currently in the action stage of change. As such, her goal is to continue with weight loss efforts. She has agreed to  keeping a food journal and adhering to recommended goals of 1500-1600 calories and 120 grams of protein daily.   She was advised on how to order the appropriate meals when eating out., i.e., ensuring enough protein with meal.  Exercise goals: [name] will  continue cardio/strengthening 60 minutes 4 times a week.  Behavioral modification strategies: increasing lean protein intake, meal planning and cooking strategies, holiday eating strategies , planning for success and keeping a strict food journal.  [name] has agreed to follow-up with our clinic in 3 weeks. She was informed of the importance of frequent follow-up visits to maximize her success with intensive lifestyle modifications for her multiple health conditions.   Objective:   Blood pressure 126/77, pulse 73, temperature 98.3 F (36.8 C), height 5\' 5"  (1.651 m), weight 184 lb (83.5 kg), SpO2 99 %. Body mass index is 30.62 kg/m.  General: Cooperative, alert, well developed, in no acute distress. HEENT: Conjunctivae and lids unremarkable. Cardiovascular: Regular rhythm.  Lungs: Normal work of breathing. Neurologic: No focal deficits.   Lab Results  Component Value Date   CREATININE 0.85 10/24/2020   BUN 11 10/24/2020   NA 141 10/24/2020   K 4.3 10/24/2020   CL 105 10/24/2020   CO2 25 10/24/2020   Lab Results  Component Value Date   ALT 16 10/24/2020   AST 13 10/24/2020   ALKPHOS 82 10/24/2020   BILITOT <0.2 10/24/2020   Lab Results  Component Value Date   HGBA1C 5.0 05/31/2020   Lab Results  Component Value Date   INSULIN 17.8 10/24/2020   INSULIN 13.6 05/31/2020   Lab Results  Component Value Date   TSH 1.68 10/12/2020   Lab Results  Component Value Date   CHOL 190 10/24/2020   HDL 55 10/24/2020   LDLCALC 123 (H) 10/24/2020   TRIG 62 10/24/2020   CHOLHDL 3.5 10/24/2020   Lab Results  Component Value Date   WBC 6.9 05/31/2020   HGB 14.5 05/31/2020   HCT 43.5 05/31/2020   MCV 91 05/31/2020   PLT 322 05/31/2020   Lab Results  Component Value Date   IRON 57 02/24/2019   FERRITIN 30.7 02/24/2019   Attestation Statements:   Reviewed by clinician on day of visit: allergies, medications, problem list, medical history, surgical history, family history,  social history, and previous encounter notes.  Time spent on visit including pre-visit chart review and post-visit charting and care was 29 minutes.   I, 02/26/2019, am acting as [employer] for [employer] manager, NP-C   I have reviewed the above documentation for accuracy and completeness, and I agree with the above. -  Kaesen Rodriguez d. Chavon Lucarelli, NP-C

## 2020-11-28 ENCOUNTER — Ambulatory Visit (INDEPENDENT_AMBULATORY_CARE_PROVIDER_SITE_OTHER): Payer: 59 | Admitting: Adult Health

## 2020-12-12 ENCOUNTER — Encounter (INDEPENDENT_AMBULATORY_CARE_PROVIDER_SITE_OTHER): Payer: Self-pay

## 2020-12-12 ENCOUNTER — Ambulatory Visit (INDEPENDENT_AMBULATORY_CARE_PROVIDER_SITE_OTHER): Payer: 59 | Admitting: Adult Health

## 2020-12-14 ENCOUNTER — Ambulatory Visit (INDEPENDENT_AMBULATORY_CARE_PROVIDER_SITE_OTHER): Payer: 59 | Admitting: Adult Health

## 2020-12-14 ENCOUNTER — Other Ambulatory Visit: Payer: Self-pay

## 2020-12-14 ENCOUNTER — Encounter (INDEPENDENT_AMBULATORY_CARE_PROVIDER_SITE_OTHER): Payer: Self-pay | Admitting: Adult Health

## 2020-12-14 VITALS — BP 116/77 | HR 74 | Temp 98.2°F | Ht 65.0 in | Wt 184.0 lb

## 2020-12-14 DIAGNOSIS — E8881 Metabolic syndrome: Secondary | ICD-10-CM | POA: Diagnosis not present

## 2020-12-14 DIAGNOSIS — E669 Obesity, unspecified: Secondary | ICD-10-CM

## 2020-12-14 DIAGNOSIS — Z6830 Body mass index (BMI) 30.0-30.9, adult: Secondary | ICD-10-CM

## 2020-12-14 DIAGNOSIS — E038 Other specified hypothyroidism: Secondary | ICD-10-CM

## 2020-12-14 NOTE — Progress Notes (Signed)
Chief Complaint:   OBESITY [name] is here to discuss her progress with her obesity treatment plan along with follow-up of her obesity related diagnoses. [name] is keeping a food journal and adhering to recommended goals of 1500-1600 calories and 120 grams of protein and states she is following her eating plan approximately 85% of the time. [name] states she is strength training 60 minutes 4 times per week.  Today's visit was #: 12 Starting weight: 199 lbs Starting date: 05/31/2020 Today's weight: 184 lbs Today's date: 12/14/2020 Total lbs lost to date: 15 Total lbs lost since last in-office visit: 0  Interim History: [name] is trying to track often and early in the day. She finds the tracking really keeps her on goal.   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  Subjective:   Insulin resistance. [name] has a diagnosis of insulin resistance based on her elevated fasting insulin level >5. She continues to work on diet and exercise to decrease her risk of diabetes. Insulin level on 10/24/2020 was above goal at 17.8 with normal blood glucose. She is not on metformin and denies polyphagia.  Lab Results  Component Value Date   INSULIN 17.8 10/24/2020   INSULIN 13.6 05/31/2020   Lab Results  Component Value Date   HGBA1C 5.0 05/31/2020   Other specified hypothyroidism. TSH on 10/12/2020 was within normal limits at 1.68. [name] is on levothyroxine 50 mcg daily and reports increased energy levels.  Lab Results  Component Value Date   TSH 1.68 10/12/2020   Assessment/Plan:   Insulin resistance. [name] will continue to work on weight loss, exercise, increasing protein, and decreasing simple carbohydrates to help decrease the risk of diabetes. [name] agreed to follow-up with [name] as directed to closely monitor her progress.  Other specified hypothyroidism. Patient  with long-standing hypothyroidism, on levothyroxine therapy. She appears euthyroid. Orders and follow up as documented in patient record. [name] will continue levothyroxine 50 mcg daily as directed.   Counseling . Good thyroid control is important for overall health. Supratherapeutic thyroid levels are dangerous and will not improve weight loss results. . The correct way to take levothyroxine is fasting, with water, separated by at least 30 minutes from breakfast, and separated by more than 4 hours from calcium, iron, multivitamins, acid reflux medications (PPIs).   Class 1 obesity with serious comorbidity and body mass index (BMI) of 30.0 to 30.9 in adult, unspecified obesity type.  [name] is currently in the action stage of change. As such, her goal is to continue with weight loss efforts. She has agreed to keeping a food journal and adhering to recommended goals of 1500-1600 calories and 120 grams of  protein daily.  Handouts were provided on Holiday Strategies and Holiday Recipes.   Exercise goals: [name] will continue strength training 60 minutes 4 times per week.  Behavioral modification strategies: increasing lean protein intake, decreasing simple carbohydrates, no skipping meals, meal planning and cooking strategies, holiday eating strategies , celebration eating strategies, planning for success and keeping a strict food journal.  [name] has agreed to follow-up with our clinic in 3 weeks. She was informed of the importance of frequent follow-up visits to maximize her success with intensive lifestyle modifications for her multiple health conditions.   Objective:   Blood pressure 116/77, pulse 74, temperature 98.2 F (36.8 C), height 5\' 5"  (1.651 m),  weight 184 lb (83.5 kg), SpO2 99 %. Body mass index is 30.62 kg/m.  General: Cooperative, alert, well developed, in no acute distress. HEENT: Conjunctivae and lids unremarkable. Cardiovascular: Regular rhythm.  Lungs: Normal work of  breathing. Neurologic: No focal deficits.   Lab Results  Component Value Date   CREATININE 0.85 10/24/2020   BUN 11 10/24/2020   NA 141 10/24/2020   K 4.3 10/24/2020   CL 105 10/24/2020   CO2 25 10/24/2020   Lab Results  Component Value Date   ALT 16 10/24/2020   AST 13 10/24/2020   ALKPHOS 82 10/24/2020   BILITOT <0.2 10/24/2020   Lab Results  Component Value Date   HGBA1C 5.0 05/31/2020   Lab Results  Component Value Date   INSULIN 17.8 10/24/2020   INSULIN 13.6 05/31/2020   Lab Results  Component Value Date   TSH 1.68 10/12/2020   Lab Results  Component Value Date   CHOL 190 10/24/2020   HDL 55 10/24/2020   LDLCALC 123 (H) 10/24/2020   TRIG 62 10/24/2020   CHOLHDL 3.5 10/24/2020   Lab Results  Component Value Date   WBC 6.9 05/31/2020   HGB 14.5 05/31/2020   HCT 43.5 05/31/2020   MCV 91 05/31/2020   PLT 322 05/31/2020   Lab Results  Component Value Date   IRON 57 02/24/2019   FERRITIN 30.7 02/24/2019   Attestation Statements:   Reviewed by clinician on day of visit: allergies, medications, problem list, medical history, surgical history, family history, social history, and previous encounter notes.  Time spent on visit including pre-visit chart review and post-visit charting and care was 29 minutes.   I, Marianna Payment, am acting as Energy manager for [employer], NP-C   I have reviewed the above documentation for accuracy and completeness, and I agree with the above. -  Lind Ausley d. Griffin Gerrard, NP-C

## 2021-01-04 ENCOUNTER — Telehealth (INDEPENDENT_AMBULATORY_CARE_PROVIDER_SITE_OTHER): Payer: 59 | Admitting: Adult Health

## 2021-01-04 ENCOUNTER — Encounter (INDEPENDENT_AMBULATORY_CARE_PROVIDER_SITE_OTHER): Payer: Self-pay | Admitting: Adult Health

## 2021-01-04 DIAGNOSIS — E8881 Metabolic syndrome: Secondary | ICD-10-CM

## 2021-01-04 DIAGNOSIS — Z6830 Body mass index (BMI) 30.0-30.9, adult: Secondary | ICD-10-CM

## 2021-01-04 DIAGNOSIS — E669 Obesity, unspecified: Secondary | ICD-10-CM | POA: Diagnosis not present

## 2021-01-08 NOTE — Progress Notes (Signed)
TeleHealth Visit:  Due to the COVID-19 pandemic, this visit was completed with telemedicine (audio/video) technology to reduce patient and provider exposure as well as to preserve personal protective equipment.   [name] has verbally consented to this TeleHealth visit. The patient is located at home, the provider is located at the [facility] office. The participants in this visit include the listed provider and patient. The visit was conducted today via video.  Chief Complaint: OBESITY [name] is here to discuss her progress with her obesity treatment plan along with follow-up of her obesity related diagnoses. [name] is on the Category 3 Plan and keeping a food journal and adhering to recommended goals of 1500-1600 calories and 120 g protein and states she is following her eating plan approximately 60% of the time. [name] states she is doing cardio 60 minutes 4 times per week.  Today's visit was #: 13 Starting weight: 199 lbs Starting date: 05/31/2020  Interim History: Since she has started working at the [employer], [name] has been getting a lot of walking/physical activity during her shifts. She will often experience physical soreness the next day. She estimates to be tracking intake 100% of the time.  Subjective:   1. Insulin resistance [name] has a diagnosis of insulin resistance based on her elevated fasting insulin level >5. She continues to work on diet and exercise to decrease her risk of diabetes. 10/24/2020 Insulin level was 17.8 and blood glucose at goal at 79. She will crave carbohydrates during her menstrual cycle. She is not Metformin.  Lab Results  Component Value Date   INSULIN 17.8 10/24/2020   INSULIN 13.6 05/31/2020   Lab Results  Component Value Date   HGBA1C 5.0 05/31/2020     Assessment/Plan:   1. Insulin resistance [name] will continue to work on weight loss, exercise, and decreasing simple carbohydrates to help decrease the risk of diabetes.  [name] agreed to follow-up with [name] as directed to closely monitor her progress. Increase protein and limit simple carbohydrate intake.  2. Class 1 obesity with serious comorbidity and body mass index (BMI) of 30.0 to 30.9 in adult, unspecified obesity type [name] is currently in the action stage of change. As such, her goal is to continue with weight loss efforts. She has agreed to keeping a food journal and adhering to recommended goals of 1500-1600 calories and 120 g protein.   Exercise goals: As is  Behavioral modification strategies: increasing lean protein intake, decreasing simple carbohydrates, meal planning and cooking strategies, planning for success and keeping a strict food journal.  [name] has agreed to follow-up with our clinic in 2 weeks. She was informed of the importance of frequent follow-up visits to maximize her success with intensive lifestyle modifications for her multiple health conditions.  Objective:   VITALS: Per patient if applicable, see vitals. GENERAL: Alert and in no acute distress. CARDIOPULMONARY: No increased WOB. Speaking in clear sentences.  PSYCH: Pleasant and cooperative. Speech normal rate and rhythm. Affect is appropriate. Insight and judgement are appropriate. Attention is focused, linear, and appropriate.  NEURO: Oriented as arrived to appointment on time with no prompting.   Lab Results  Component Value Date   CREATININE 0.85 10/24/2020   BUN 11 10/24/2020   NA 141 10/24/2020   K 4.3 10/24/2020   CL 105 10/24/2020   CO2 25 10/24/2020   Lab Results  Component Value Date   ALT 16 10/24/2020   AST 13 10/24/2020   ALKPHOS 82 10/24/2020  BILITOT <0.2 10/24/2020   Lab Results  Component Value Date   HGBA1C 5.0 05/31/2020   Lab Results  Component Value Date   INSULIN 17.8 10/24/2020   INSULIN 13.6 05/31/2020   Lab Results  Component Value Date   TSH 1.68 10/12/2020   Lab Results  Component Value Date   CHOL 190 10/24/2020   HDL 55  10/24/2020   LDLCALC 123 (H) 10/24/2020   TRIG 62 10/24/2020   CHOLHDL 3.5 10/24/2020   Lab Results  Component Value Date   WBC 6.9 05/31/2020   HGB 14.5 05/31/2020   HCT 43.5 05/31/2020   MCV 91 05/31/2020   PLT 322 05/31/2020   Lab Results  Component Value Date   IRON 57 02/24/2019   FERRITIN 30.7 02/24/2019    Attestation Statements:   Reviewed by clinician on day of visit: allergies, medications, problem list, medical history, surgical history, family history, social history, and previous encounter notes.  Time spent on visit including pre-visit chart review and post-visit charting and care was 32 minutes.   Edmund Hilda, am acting as Energy manager for William Hamburger, NP.  I have reviewed the above documentation for accuracy and completeness, and I agree with the above. - Wilene Pharo d. Janellie Tennison, NP-C

## 2021-01-14 ENCOUNTER — Encounter (INDEPENDENT_AMBULATORY_CARE_PROVIDER_SITE_OTHER): Payer: Self-pay

## 2021-01-17 ENCOUNTER — Telehealth (INDEPENDENT_AMBULATORY_CARE_PROVIDER_SITE_OTHER): Payer: 59 | Admitting: Adult Health

## 2021-02-02 ENCOUNTER — Other Ambulatory Visit: Payer: Self-pay

## 2021-02-02 ENCOUNTER — Ambulatory Visit (INDEPENDENT_AMBULATORY_CARE_PROVIDER_SITE_OTHER): Payer: 59 | Admitting: Adult Health

## 2021-02-02 ENCOUNTER — Encounter (INDEPENDENT_AMBULATORY_CARE_PROVIDER_SITE_OTHER): Payer: Self-pay | Admitting: Adult Health

## 2021-02-02 VITALS — BP 122/75 | HR 69 | Temp 97.9°F | Ht 65.0 in | Wt 185.0 lb

## 2021-02-02 DIAGNOSIS — E8881 Metabolic syndrome: Secondary | ICD-10-CM | POA: Diagnosis not present

## 2021-02-02 DIAGNOSIS — E669 Obesity, unspecified: Secondary | ICD-10-CM

## 2021-02-02 DIAGNOSIS — Z6830 Body mass index (BMI) 30.0-30.9, adult: Secondary | ICD-10-CM

## 2021-02-02 DIAGNOSIS — R7401 Elevation of levels of liver transaminase levels: Secondary | ICD-10-CM

## 2021-02-02 DIAGNOSIS — R632 Polyphagia: Secondary | ICD-10-CM | POA: Diagnosis not present

## 2021-02-06 DIAGNOSIS — R632 Polyphagia: Secondary | ICD-10-CM | POA: Insufficient documentation

## 2021-02-06 NOTE — Progress Notes (Signed)
Chief Complaint:   OBESITY [name] is here to discuss her progress with her obesity treatment plan along with follow-up of her obesity related diagnoses. [name] is on keeping a food journal and adhering to recommended goals of 1500-1600 calories and 120 g protein and states she is following her eating plan approximately 75% of the time. [name] states she is strength 60 minutes 2-3 times per week.  Today's visit was #: 14 Starting weight: 199 lbs Starting date: 05/31/2020 Today's weight: 185 lbs Today's date: 02/02/2021 Total lbs lost to date: 14 lbs Total lbs lost since last in-office visit: 0  Interim History: Pt is still working 3rd shift at [employer]. She has been taking high protein snacks to work and hydrating with water during her shift job.  Interval goal: tracking intake 100% of the time and hitting goals at least 90% of the time.  Subjective:   1. Polyphagia [name] resumed Sronyx oral birth control. She experienced pronounced polyphagia when she was off Sronyx for 3 weeks. Now her cravings are well controlled.  2. Insulin resistance 10/24/2020 Insulin level 17.8 with normal blood glucose of 79.  Lab Results  Component Value Date   INSULIN 17.8 10/24/2020   INSULIN 13.6 05/31/2020   Lab Results  Component Value Date   HGBA1C 5.0 05/31/2020    3. Transaminitis [name] denies abdominal pain. Last two CMP resulted with normal LFT levels.  Assessment/Plan:   1. Polyphagia Continue Sronyx and journaling plan.  Increase protein intake.  2. Insulin resistance [name] will continue to work on weight loss, exercise, and decreasing simple carbohydrates to help decrease the risk of diabetes. [name] agreed to follow-up with [name] as directed to closely monitor her progress. Increase daily protein intake. Check labs at next OV.   3. Transaminitis Check labs at next OV.  4. Class 1 obesity with serious comorbidity and body mass index (BMI) of 30.0 to 30.9 in adult, unspecified obesity  type [name] is currently in the action stage of change. As such, her goal is to continue with weight loss efforts. She has agreed to keeping a food journal and adhering to recommended goals of 1500-1600 calories and 120 g protein.   Fasting labs at next OV.  Exercise goals: As is  Behavioral modification strategies: increasing lean protein intake, meal planning and cooking strategies, planning for success and keeping a strict food journal.  [name] has agreed to follow-up with our clinic fasting in 3 weeks. She was informed of the importance of frequent follow-up visits to maximize her success with intensive lifestyle modifications for her multiple health conditions.   Objective:   Blood pressure 122/75, pulse 69, temperature 97.9 F (36.6 C), height 5\' 5"  (1.651 m), weight 185 lb (83.9 kg), SpO2 99 %. Body mass index is 30.79 kg/m.  General: Cooperative, alert, well developed, in no acute distress. HEENT: Conjunctivae and lids unremarkable. Cardiovascular: Regular rhythm.  Lungs: Normal work of breathing. Neurologic: No focal deficits.   Lab Results  Component Value Date   CREATININE 0.85 10/24/2020   BUN 11 10/24/2020   NA 141 10/24/2020   K 4.3 10/24/2020   CL 105 10/24/2020   CO2 25 10/24/2020   Lab Results  Component Value Date   ALT 16 10/24/2020   AST 13 10/24/2020   ALKPHOS 82 10/24/2020   BILITOT <0.2 10/24/2020   Lab Results  Component Value Date   HGBA1C 5.0 05/31/2020   Lab Results  Component Value Date   INSULIN 17.8 10/24/2020  INSULIN 13.6 05/31/2020   Lab Results  Component Value Date   TSH 1.68 10/12/2020   Lab Results  Component Value Date   CHOL 190 10/24/2020   HDL 55 10/24/2020   LDLCALC 123 (H) 10/24/2020   TRIG 62 10/24/2020   CHOLHDL 3.5 10/24/2020   Lab Results  Component Value Date   WBC 6.9 05/31/2020   HGB 14.5 05/31/2020   HCT 43.5 05/31/2020   MCV 91 05/31/2020   PLT 322 05/31/2020   Lab Results  Component Value Date    IRON 57 02/24/2019   FERRITIN 30.7 02/24/2019    Attestation Statements:   Reviewed by clinician on day of visit: allergies, medications, problem list, medical history, surgical history, family history, social history, and previous encounter notes.  Time spent on visit including pre-visit chart review and post-visit care and charting was 33 minutes.   Edmund Hilda, am acting as Energy manager for William Hamburger, NP.  I have reviewed the above documentation for accuracy and completeness, and I agree with the above. -  Jaydenn Boccio d. Brexley Cutshaw, NP-C

## 2021-02-21 ENCOUNTER — Ambulatory Visit (INDEPENDENT_AMBULATORY_CARE_PROVIDER_SITE_OTHER): Payer: 59 | Admitting: Adult Health

## 2021-02-21 ENCOUNTER — Encounter (INDEPENDENT_AMBULATORY_CARE_PROVIDER_SITE_OTHER): Payer: Self-pay | Admitting: Adult Health

## 2021-02-21 ENCOUNTER — Other Ambulatory Visit: Payer: Self-pay

## 2021-02-21 VITALS — BP 118/72 | HR 63 | Temp 98.1°F | Ht 65.0 in | Wt 185.0 lb

## 2021-02-21 DIAGNOSIS — E669 Obesity, unspecified: Secondary | ICD-10-CM

## 2021-02-21 DIAGNOSIS — E782 Mixed hyperlipidemia: Secondary | ICD-10-CM

## 2021-02-21 DIAGNOSIS — Z9189 Other specified personal risk factors, not elsewhere classified: Secondary | ICD-10-CM | POA: Diagnosis not present

## 2021-02-21 DIAGNOSIS — Z6830 Body mass index (BMI) 30.0-30.9, adult: Secondary | ICD-10-CM

## 2021-02-21 DIAGNOSIS — R7401 Elevation of levels of liver transaminase levels: Secondary | ICD-10-CM

## 2021-02-21 DIAGNOSIS — E66811 Obesity, class 1: Secondary | ICD-10-CM

## 2021-02-21 DIAGNOSIS — E8881 Metabolic syndrome: Secondary | ICD-10-CM | POA: Diagnosis not present

## 2021-02-21 DIAGNOSIS — E88819 Insulin resistance, unspecified: Secondary | ICD-10-CM

## 2021-02-22 LAB — COMPREHENSIVE METABOLIC PANEL
ALT: 17 IU/L (ref 0–32)
AST: 19 IU/L (ref 0–40)
Albumin/Globulin Ratio: 2 (ref 1.2–2.2)
Albumin: 4 g/dL (ref 3.8–4.8)
Alkaline Phosphatase: 60 IU/L (ref 44–121)
BUN/Creatinine Ratio: 14 (ref 9–23)
BUN: 12 mg/dL (ref 6–20)
Bilirubin Total: 0.2 mg/dL (ref 0.0–1.2)
CO2: 22 mmol/L (ref 20–29)
Calcium: 8.8 mg/dL (ref 8.7–10.2)
Chloride: 103 mmol/L (ref 96–106)
Creatinine, Ser: 0.86 mg/dL (ref 0.57–1.00)
GFR calc Af Amer: 104 mL/min/{1.73_m2} (ref >59)
GFR calc non Af Amer: 90 mL/min/{1.73_m2} (ref >59)
Globulin, Total: 2 g/dL (ref 1.5–4.5)
Glucose: 83 mg/dL (ref 65–99)
Potassium: 4.3 mmol/L (ref 3.5–5.2)
Sodium: 140 mmol/L (ref 134–144)
Total Protein: 6 g/dL (ref 6.0–8.5)

## 2021-02-22 LAB — LIPID PANEL
Chol/HDL Ratio: 3.9 ratio (ref 0.0–4.4)
Cholesterol, Total: 229 mg/dL — ABNORMAL HIGH (ref 100–199)
HDL: 59 mg/dL (ref >39)
LDL Chol Calc (NIH): 154 mg/dL — ABNORMAL HIGH (ref 0–99)
Triglycerides: 93 mg/dL (ref 0–149)
VLDL Cholesterol Cal: 16 mg/dL (ref 5–40)

## 2021-02-22 LAB — HEMOGLOBIN A1C
Est. average glucose Bld gHb Est-mCnc: 97 mg/dL
Hgb A1c MFr Bld: 5 % (ref 4.8–5.6)

## 2021-02-22 LAB — INSULIN, RANDOM: INSULIN: 14.9 u[IU]/mL (ref 2.6–24.9)

## 2021-02-22 NOTE — Progress Notes (Signed)
Chief Complaint:   OBESITY [name] is here to discuss her progress with her obesity treatment plan along with follow-up of her obesity related diagnoses. [name] is on keeping a food journal and adhering to recommended goals of 1500-1600 calories and 120 g protein and states she is following her eating plan approximately 100% of the time. [name] states she is strength training 60 minutes 2-3 times per week.  Today's visit was #: 15 Starting weight: 199 lbs Starting date: 05/31/2020 Today's weight: 185 lbs Today's date: 02/21/2021 Total lbs lost to date: 14 lbs Total lbs lost since last in-office visit: 0  Interim History: [name] is now tracking her intake 100% of the time- met her interval goal. She estimates to consume 1650 cal/day and 75-95 g protein/day. She has left her 3rd shift job at [employer].  She reports reduction in overall fatigue since leaving the 3rd shift position.  Subjective:   1. Insulin resistance [name]'s insulin level worsened at last check of 17.8 on 10/24/2020.   Ref. Range 10/24/2020 09:02  INSULIN Latest Ref Range: 2.6 - 24.9 uIU/mL 17.8   2. Transaminitis [name] denies abdominal pain.  3. Mixed hyperlipidemia [name]'s last lipid panel resulted total and LDL both improved, however, LDL is still above goal.  4. At risk for diabetes mellitus [name] is at higher than average risk for developing diabetes due to obesity and insulin resistance.  Assessment/Plan:   1. Insulin resistance Vinette will continue to work on weight loss, exercise, and decreasing simple carbohydrates to help decrease the risk of diabetes. [name] agreed to follow-up with [name] as directed to closely monitor her progress. Check labs today.  - Hemoglobin A1c - Insulin, random  2. Transaminitis Check labs today.  - Comprehensive metabolic panel  3. Mixed hyperlipidemia Cardiovascular risk and specific lipid/LDL goals reviewed.  We discussed several lifestyle modifications today and Kenny will  continue to work on diet, exercise and weight loss efforts. Orders and follow up as documented in patient record. Check labs today.  Counseling Intensive lifestyle modifications are the first line treatment for this issue. . Dietary changes: Increase soluble fiber. Decrease simple carbohydrates. . Exercise changes: Moderate to vigorous-intensity aerobic activity 150 minutes per week if tolerated. . Lipid-lowering medications: see documented in medical record. - Lipid panel  4. At risk for diabetes mellitus [name] was given approximately 15 minutes of diabetes education and counseling today. We discussed intensive lifestyle modifications today with an emphasis on weight loss as well as increasing exercise and decreasing simple carbohydrates in her diet. We also reviewed medication options with an emphasis on risk versus benefit of those discussed.   Repetitive spaced learning was employed today to elicit superior memory formation and behavioral change.  5. Class 1 obesity with serious comorbidity and body mass index (BMI) of 30.0 to 30.9 in adult, unspecified obesity type [name] is currently in the action stage of change. As such, her goal is to continue with weight loss efforts. She has agreed to keeping a food journal and adhering to recommended goals of 1500-1600 calories and 120 g protein.   Exercise goals: As is  Behavioral modification strategies: increasing lean protein intake, meal planning and cooking strategies, planning for success and keeping a strict food journal.  [name] has agreed to follow-up with our clinic in 2 weeks. She was informed of the importance of frequent follow-up visits to maximize her success with intensive lifestyle modifications for her multiple health conditions.   [name] was informed we would discuss  her lab results at her next visit unless there is a critical issue that needs to be addressed sooner. [name] agreed to keep her next visit at the agreed upon time to  discuss these results.  Objective:   Blood pressure 118/72, pulse 63, temperature 98.1 F (36.7 C), height 5' 5"  (1.651 m), weight 185 lb (83.9 kg), SpO2 98 %. Body mass index is 30.79 kg/m.  General: Cooperative, alert, well developed, in no acute distress. HEENT: Conjunctivae and lids unremarkable. Cardiovascular: Regular rhythm.  Lungs: Normal work of breathing. Neurologic: No focal deficits.   Lab Results  Component Value Date   CREATININE 0.86 02/21/2021   BUN 12 02/21/2021   NA 140 02/21/2021   K 4.3 02/21/2021   CL 103 02/21/2021   CO2 22 02/21/2021   Lab Results  Component Value Date   ALT 17 02/21/2021   AST 19 02/21/2021   ALKPHOS 60 02/21/2021   BILITOT 0.2 02/21/2021   Lab Results  Component Value Date   HGBA1C 5.0 02/21/2021   HGBA1C 5.0 05/31/2020   Lab Results  Component Value Date   INSULIN 14.9 02/21/2021   INSULIN 17.8 10/24/2020   INSULIN 13.6 05/31/2020   Lab Results  Component Value Date   TSH 1.68 10/12/2020   Lab Results  Component Value Date   CHOL 229 (H) 02/21/2021   HDL 59 02/21/2021   LDLCALC 154 (H) 02/21/2021   TRIG 93 02/21/2021   CHOLHDL 3.9 02/21/2021   Lab Results  Component Value Date   WBC 6.9 05/31/2020   HGB 14.5 05/31/2020   HCT 43.5 05/31/2020   MCV 91 05/31/2020   PLT 322 05/31/2020   Lab Results  Component Value Date   IRON 57 02/24/2019   FERRITIN 30.7 02/24/2019    Attestation Statements:   Reviewed by clinician on day of visit: allergies, medications, problem list, medical history, surgical history, family history, social history, and previous encounter notes.  Coral Ceo, am acting as Location manager for Mina Marble, NP.  I have reviewed the above documentation for accuracy and completeness, and I agree with the above. -  Miyoko Hashimi d. Naiara Lombardozzi, NP-C

## 2021-03-01 ENCOUNTER — Encounter: Payer: Self-pay | Admitting: Family Medicine

## 2021-03-01 ENCOUNTER — Other Ambulatory Visit: Payer: Self-pay

## 2021-03-01 ENCOUNTER — Ambulatory Visit: Payer: 59 | Admitting: Family Medicine

## 2021-03-01 VITALS — BP 98/62 | HR 73 | Temp 98.0°F | Ht 65.0 in | Wt 190.8 lb

## 2021-03-01 DIAGNOSIS — Z0000 Encounter for general adult medical examination without abnormal findings: Secondary | ICD-10-CM

## 2021-03-01 DIAGNOSIS — N981 Hyperstimulation of ovaries: Secondary | ICD-10-CM | POA: Diagnosis not present

## 2021-03-01 NOTE — Progress Notes (Signed)
Annual Exam   Chief Complaint:  Chief Complaint  Patient presents with  . Transitions Of Care    No concerns    History of Present Illness:  Ms. [name] is a 30 y.o. G3P1001 who LMP was Patient's last menstrual period was 02/03/2021 (exact date)., presents today for her annual examination.      Nutrition/Lifestyle Exercise: strength training - mix of everything - 5 days a week, 1 hour Diet: low calorie, healthy diet and high protein diet She does get adequate calcium and Vitamin D in her diet.  Social History   Tobacco Use  Smoking Status Never Smoker  Smokeless Tobacco Never Used   Social History   Substance and Sexual Activity  Alcohol Use Yes  . Alcohol/week: 0.0 standard drinks   Comment: a few times a year   Social History   Substance and Sexual Activity  Drug Use No     Safety The patient wears seatbelts: yes.     The patient feels safe at home and in their relationships: yes.  General Health Dentist in the last year: No - will make plans to go Eye doctor: not applicable  Menstrual On OCPs Monthly cycle which is light  GYN She is single partner, contraception - OCP (estrogen/progesterone).     Cervical Cancer Screening (Age 77-65) Last Pap:  December 2020 Results were: no abnormalities with HPV negative  Family History of Breast Cancer: yes Family History of Ovarian Cancer: no    Weight Wt Readings from Last 3 Encounters:  03/01/21 190 lb 12 oz (86.5 kg)  02/21/21 185 lb (83.9 kg)  02/02/21 185 lb (83.9 kg)   Patient has high BMI  BMI Readings from Last 1 Encounters:  03/01/21 31.74 kg/m     Chronic disease screening Blood pressure monitoring:  BP Readings from Last 3 Encounters:  03/01/21 98/62  02/21/21 118/72  02/02/21 122/75     Lipid Monitoring: Indication for screening: age >29, obesity, diabetes, family hx, CV risk factors.  Lipid screening: Yes  Lab Results  Component Value Date   CHOL 229 (H) 02/21/2021    HDL 59 02/21/2021   LDLCALC 154 (H) 02/21/2021   TRIG 93 02/21/2021   CHOLHDL 3.9 02/21/2021     Diabetes Screening: age >30, overweight, family hx, PCOS, hx of gestational diabetes, at risk ethnicity, elevated blood pressure >135/80.  Diabetes Screening screening: Yes  Lab Results  Component Value Date   HGBA1C 5.0 02/21/2021      Past Medical History:  Diagnosis Date  . Fatty liver   . Headache    Migraines  . History of chicken pox   . Hypothyroidism   . IBS (irritable bowel syndrome)   . Microscopic colitis   . PCOS (polycystic ovarian syndrome)   . Urinary tract infection   . Vertigo     Past Surgical History:  Procedure Laterality Date  . LAPAROSCOPIC OVARIAN CYSTECTOMY Bilateral 07/08/2019   Procedure: LAPAROSCOPIC OVARIAN CYSTECTOMY with peritoneal biopsies;  Surgeon: Allie Bossier, MD;  Location: [facility];  Service: Gynecology;  Laterality: Bilateral;  . TONSILLECTOMY     adenoidectomy  . WISDOM TOOTH EXTRACTION      Prior to Admission medications   Medication Sig Start Date End Date Taking? Authorizing Provider  levothyroxine (SYNTHROID) 50 MCG tablet TAKE 1 TABLET BY MOUTH EVERY DAY 10/18/20  Yes Reather Littler, MD  SRONYX 0.1-20 MG-MCG tablet Take 1 tablet by mouth daily. Skip placebo pills 08/12/20  Yes Anyanwu,  Jethro BastosUgonna A, MD  valACYclovir (VALTREX) 1000 MG tablet Take 1 tablet (1,000 mg total) by mouth 2 (two) times daily. Patient taking differently: Take 1,000 mg by mouth 2 (two) times daily as needed. 07/01/19  Yes Helane RimaWallace, Erica, DO    Allergies  Allergen Reactions  . Erythromycin   . Penicillins Hives    Can tolerate cephalosporins    Gynecologic History: Patient's last menstrual period was 02/03/2021 (exact date).  Obstetric History: G3P1001  Social History   Socioeconomic History  . Marital status: Married    Spouse name: Campbell LernerKevin Eichler  . Number of children: 1  . Years of education: Not on file  . Highest education level: Not  on file  Occupational History  . Occupation: Starting non profit  Tobacco Use  . Smoking status: Never Smoker  . Smokeless tobacco: Never Used  Vaping Use  . Vaping Use: Never used  Substance and Sexual Activity  . Alcohol use: Yes    Alcohol/week: 0.0 standard drinks    Comment: a few times a year  . Drug use: No  . Sexual activity: Yes    Birth control/protection: Pill  Other Topics Concern  . 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 - [employer]      Family: daughter - Rodman PickleCassidy (2014)      Enjoys: exercise, spend time with family      Exercise: strength training - mix of everything - 5 days a week, 1 hour   Diet: low calorie, healthy diet and high protein diet      Safety   Seat belts: Yes    Guns: No   Safe in relationships: Yes    Social Determinants of Corporate investment bankerHealth   Financial Resource Strain: Not on file  Food Insecurity: Not on file  Transportation Needs: Not on file  Physical Activity: Not on file  Stress: Not on file  Social Connections: Not on file  Intimate Partner Violence: Not on file    Family History  Problem Relation Age of Onset  . Diabetes Maternal Grandmother   . Breast cancer Maternal Grandmother [id number]  . Parkinson's disease Maternal Grandmother   . Thyroid disease Father   . Sleep apnea Mother   . Obesity Mother   . Kidney disease Paternal Grandmother   . Thyroid disease Paternal Grandmother   . Healthy Brother   . Healthy Daughter   . Anesthesia problems Neg Hx     Review of Systems  Constitutional: Negative for chills and fever.  HENT: Negative for congestion and sore throat.   Eyes: Negative for blurred vision and double vision.  Respiratory: Negative for shortness of breath.   Cardiovascular: Negative for chest pain.  Gastrointestinal: Negative for heartburn, nausea and vomiting.  Genitourinary: Negative.    Musculoskeletal: Negative.  Negative for myalgias.  Skin: Negative for rash.  Neurological: Negative for dizziness and headaches.  Endo/Heme/Allergies: Does not bruise/bleed easily.  Psychiatric/Behavioral: Negative for depression. The patient is not nervous/anxious.      Physical Exam BP 98/62   Pulse 73   Temp 98 F (36.7 C) (Temporal)   Ht 5\' 5"  (1.651 m)   Wt 190 lb 12 oz (86.5 kg)   LMP 02/03/2021 (Exact Date)   SpO2 99%   BMI 31.74 kg/m    BP Readings from Last 3 Encounters:  03/01/21 98/62  02/21/21 118/72  02/02/21 122/75    Wt Readings from Last 3 Encounters:  03/01/21 190 lb 12 oz (86.5 kg)  02/21/21 185 lb (83.9 kg)  02/02/21 185 lb (83.9 kg)     Physical Exam Constitutional:      General: She is not in acute distress.    Appearance: She is well-developed and well-nourished. She is not diaphoretic.  HENT:     Head: Normocephalic and atraumatic.     Right Ear: External ear normal.     Left Ear: External ear normal.     Nose: Nose normal.     Mouth/Throat:     Mouth: Oropharynx is clear and moist.  Eyes:     General: No scleral icterus.    Extraocular Movements: EOM normal.     Conjunctiva/sclera: Conjunctivae normal.  Cardiovascular:     Rate and Rhythm: Normal rate and regular rhythm.     Heart sounds: No murmur heard.   Pulmonary:     Effort: Pulmonary effort is normal. No respiratory distress.     Breath sounds: Normal breath sounds. No wheezing.  Abdominal:     General: Bowel sounds are normal. There is no distension.     Palpations: Abdomen is soft. There is no mass.     Tenderness: There is no abdominal tenderness. There is no guarding or rebound.  Musculoskeletal:        General: No edema. Normal range of motion.     Cervical back: Neck supple.  Lymphadenopathy:     Cervical: No cervical adenopathy.  Skin:    General: Skin is warm and dry.     Capillary Refill: Capillary refill takes less than 2 seconds.  Neurological:     Mental  Status: She is alert and oriented to person, place, and time.     Deep Tendon Reflexes: Strength normal. Reflexes normal.  Psychiatric:        Mood and Affect: Mood and affect normal.        Behavior: Behavior normal.       Results: Depression screen [facility] 2/9 03/01/2021 05/31/2020 12/16/2019  Decreased Interest 0 1 0  Down, Depressed, Hopeless 0 1 0  PHQ - 2 Score 0 2 0  Altered sleeping - 0 -  Tired, decreased energy - 2 -  Change in appetite - 2 -  Feeling bad or failure about yourself  - 1 -  Trouble concentrating - 3 -  Moving slowly or fidgety/restless - 0 -  Suicidal thoughts - 0 -  PHQ-9 Score - 10 -  Difficult doing work/chores - Not difficult at all -      Assessment: 30 y.o. G94P1001 female here for routine annual examination.  Plan: Problem List Items Addressed This Visit      Endocrine   Hyperstimulation of ovaries, with Hx of polycystic ovaries on [name]    Other Visit Diagnoses    Annual physical exam    -  Primary       Screening: -- Blood pressure screen normal -- cholesterol screening: not due for screening -- Weight screening: overweight: continue to monitor -- Diabetes Screening: not due for screening -- Nutrition: encouraged healthy diet   Psych -- Depression screening (PHQ-9):  Flowsheet Row Office Visit from 05/31/2020 in [facility]  PHQ-9 Total Score 10       Safety -- tobacco screening: not using -- alcohol screening:  low-risk usage. -- no evidence of domestic violence or intimate partner violence.   Cancer Screening -- pap  smear not collected per ASCCP guidelines -- family history of breast cancer screening: done. not at high risk.   Immunizations Immunization History  Administered Date(s) Administered  . HPV 9-valent 12/29/2019, 02/23/2020, 06/21/2020  . Tdap 05/10/2012    -- flu vaccine declined -- TDAP q10 years up to date -- Covid-19 Vaccine declined  Encouraged regular vision and dental screening.  Encouraged healthy exercise and diet.   Lynnda Child

## 2021-03-01 NOTE — Patient Instructions (Signed)
Consider getting Hep C with next lab draw  Make a dentist appointment  biglittlefeelings feedinglittles

## 2021-03-07 ENCOUNTER — Other Ambulatory Visit: Payer: Self-pay | Admitting: Obstetrics & Gynecology

## 2021-03-07 ENCOUNTER — Ambulatory Visit (INDEPENDENT_AMBULATORY_CARE_PROVIDER_SITE_OTHER): Payer: 59 | Admitting: Adult Health

## 2021-03-07 ENCOUNTER — Encounter (INDEPENDENT_AMBULATORY_CARE_PROVIDER_SITE_OTHER): Payer: Self-pay | Admitting: Adult Health

## 2021-03-07 ENCOUNTER — Other Ambulatory Visit: Payer: Self-pay

## 2021-03-07 VITALS — BP 128/81 | HR 75 | Temp 97.8°F | Ht 65.0 in | Wt 187.0 lb

## 2021-03-07 DIAGNOSIS — E669 Obesity, unspecified: Secondary | ICD-10-CM | POA: Diagnosis not present

## 2021-03-07 DIAGNOSIS — Z6831 Body mass index (BMI) 31.0-31.9, adult: Secondary | ICD-10-CM | POA: Diagnosis not present

## 2021-03-07 DIAGNOSIS — Z9189 Other specified personal risk factors, not elsewhere classified: Secondary | ICD-10-CM | POA: Diagnosis not present

## 2021-03-07 DIAGNOSIS — E8881 Metabolic syndrome: Secondary | ICD-10-CM | POA: Diagnosis not present

## 2021-03-07 DIAGNOSIS — E782 Mixed hyperlipidemia: Secondary | ICD-10-CM | POA: Diagnosis not present

## 2021-03-07 MED ORDER — METFORMIN HCL 500 MG PO TABS: ORAL_TABLET | ORAL | 0 refills | Status: DC

## 2021-03-08 NOTE — Progress Notes (Signed)
Chief Complaint:   OBESITY [name] is here to discuss her progress with her obesity treatment plan along with follow-up of her obesity related diagnoses. [name] is on keeping a food journal and adhering to recommended goals of 1500-1600 calories and 120 g protein and states she is following her eating plan approximately 95% of the time. [name] states she is doing cardio and weights 45-60 minutes 5 times per week.  Today's visit was #: 16 Starting weight: 199 lbs Starting date: 05/31/2020 Today's weight: 187 lbs Today's date: 03/07/2021 Total lbs lost to date: 12 lbs Total lbs lost since last in-office visit: 0  Interim History: These last few weeks, [name] has been more stressed and feels that she may be stress eating a little to manage emtions. She went on a women's retreat last weekend and there was food everywhere. She tried to employ [employer] during the retreat.  Subjective:   1. Mixed hyperlipidemia Worsening. Discussed labs with patient today. 02/21/2021 lipid panel resulted a total and LDL both worsened from previous check. [name]'s total is 259 and LDL 154. She denies  1st degree family history of CAD/HLD.  Lab Results  Component Value Date   ALT 17 02/21/2021   AST 19 02/21/2021   ALKPHOS 60 02/21/2021   BILITOT 0.2 02/21/2021   Lab Results  Component Value Date   CHOL 229 (H) 02/21/2021   HDL 59 02/21/2021   LDLCALC 154 (H) 02/21/2021   TRIG 93 02/21/2021   CHOLHDL 3.9 02/21/2021    2. Insulin resistance Discussed labs with patient today. 02/21/2021 insulin level slightly reduced to 14.9, down from 17.8 on 10/24/2020. CMP resulted a GFR of 90. [name]'s blood glucose and A1c continue to be excellent. She continues to experience late afternoon and evening carb cravings.  Lab Results  Component Value Date   INSULIN 14.9 02/21/2021   INSULIN 17.8 10/24/2020   INSULIN 13.6 05/31/2020   Lab Results  Component Value Date   HGBA1C 5.0 02/21/2021    3. At risk for  diarrhea [name] is at higher risk of diarrhea due to starting Metformin for insulin resistance and obesity.  Assessment/Plan:   1. Mixed hyperlipidemia Cardiovascular risk and specific lipid/LDL goals reviewed.  We discussed several lifestyle modifications today and Emiley will continue to work on diet, exercise and weight loss efforts. Orders and follow up as documented in patient record. Really focus on decreasing saturated fats.  Counseling Intensive lifestyle modifications are the first line treatment for this issue. . Dietary changes: Increase soluble fiber. Decrease simple carbohydrates. . Exercise changes: Moderate to vigorous-intensity aerobic activity 150 minutes per week if tolerated. . Lipid-lowering medications: see documented in medical record.  2. Insulin resistance [name] will continue to work on weight loss, exercise, and decreasing simple carbohydrates to help decrease the risk of diabetes. [name] agreed to follow-up with [name] as directed to closely monitor her progress. Start Metformin 500 mg, as per below.  - metFORMIN (GLUCOPHAGE) 500 MG tablet; 1/2 tablet by mouth at lunch  Dispense: 15 tablet; Refill: 0  3. At risk for diarrhea [name] was given approximately 15 minutes of diarrhea prevention counseling today. She is 30 y.o. female and has risk factors for diarrhea including medications and changes in diet. We discussed intensive lifestyle modifications today with an emphasis on specific weight loss instructions including dietary strategies.   Repetitive spaced learning was employed today to elicit superior memory formation and behavioral change.  4. Class 1 obesity with serious comorbidity and  body mass index (BMI) of 31.0 to 31.9 in adult, unspecified obesity type [name] is currently in the action stage of change. As such, her goal is to continue with weight loss efforts. She has agreed to keeping a food journal and adhering to recommended goals of 1500-1600 calories and 120 g  protein.   2 "off plan" per week but track intake.  Exercise goals: As is  Behavioral modification strategies: increasing lean protein intake, decreasing simple carbohydrates, meal planning and cooking strategies and planning for success.  [name] has agreed to follow-up with our clinic in 2 weeks. She was informed of the importance of frequent follow-up visits to maximize her success with intensive lifestyle modifications for her multiple health conditions.   Objective:   Blood pressure 128/81, pulse 75, temperature 97.8 F (36.6 C), height 5\' 5"  (1.651 m), weight 187 lb (84.8 kg), SpO2 99 %. Body mass index is 31.12 kg/m.  General: Cooperative, alert, well developed, in no acute distress. HEENT: Conjunctivae and lids unremarkable. Cardiovascular: Regular rhythm.  Lungs: Normal work of breathing. Neurologic: No focal deficits.   Lab Results  Component Value Date   CREATININE 0.86 02/21/2021   BUN 12 02/21/2021   NA 140 02/21/2021   K 4.3 02/21/2021   CL 103 02/21/2021   CO2 22 02/21/2021   Lab Results  Component Value Date   ALT 17 02/21/2021   AST 19 02/21/2021   ALKPHOS 60 02/21/2021   BILITOT 0.2 02/21/2021   Lab Results  Component Value Date   HGBA1C 5.0 02/21/2021   HGBA1C 5.0 05/31/2020   Lab Results  Component Value Date   INSULIN 14.9 02/21/2021   INSULIN 17.8 10/24/2020   INSULIN 13.6 05/31/2020   Lab Results  Component Value Date   TSH 1.68 10/12/2020   Lab Results  Component Value Date   CHOL 229 (H) 02/21/2021   HDL 59 02/21/2021   LDLCALC 154 (H) 02/21/2021   TRIG 93 02/21/2021   CHOLHDL 3.9 02/21/2021   Lab Results  Component Value Date   WBC 6.9 05/31/2020   HGB 14.5 05/31/2020   HCT 43.5 05/31/2020   MCV 91 05/31/2020   PLT 322 05/31/2020   Lab Results  Component Value Date   IRON 57 02/24/2019   FERRITIN 30.7 02/24/2019    Attestation Statements:   Reviewed by clinician on day of visit: allergies, medications, problem list,  medical history, surgical history, family history, social history, and previous encounter notes.  02/26/2019, am acting as Edmund Hilda for Energy manager, NP.  I have reviewed the above documentation for accuracy and completeness, and I agree with the above. -  Stefhanie Kachmar d. Jaelon Gatley, NP-C

## 2021-03-09 ENCOUNTER — Other Ambulatory Visit: Payer: Self-pay | Admitting: *Deleted

## 2021-03-09 NOTE — Telephone Encounter (Signed)
erroneous

## 2021-03-21 ENCOUNTER — Encounter (INDEPENDENT_AMBULATORY_CARE_PROVIDER_SITE_OTHER): Payer: Self-pay | Admitting: Adult Health

## 2021-03-21 ENCOUNTER — Ambulatory Visit (INDEPENDENT_AMBULATORY_CARE_PROVIDER_SITE_OTHER): Payer: 59 | Admitting: Adult Health

## 2021-03-21 ENCOUNTER — Other Ambulatory Visit: Payer: Self-pay

## 2021-03-21 VITALS — BP 128/78 | HR 69 | Temp 98.1°F | Ht 65.0 in | Wt 185.0 lb

## 2021-03-21 DIAGNOSIS — E039 Hypothyroidism, unspecified: Secondary | ICD-10-CM

## 2021-03-21 DIAGNOSIS — Z6830 Body mass index (BMI) 30.0-30.9, adult: Secondary | ICD-10-CM | POA: Diagnosis not present

## 2021-03-21 DIAGNOSIS — E8881 Metabolic syndrome: Secondary | ICD-10-CM | POA: Diagnosis not present

## 2021-03-21 DIAGNOSIS — Z9189 Other specified personal risk factors, not elsewhere classified: Secondary | ICD-10-CM

## 2021-03-21 DIAGNOSIS — E669 Obesity, unspecified: Secondary | ICD-10-CM

## 2021-03-21 MED ORDER — METFORMIN HCL 500 MG PO TABS: ORAL_TABLET | ORAL | 0 refills | Status: DC

## 2021-03-22 NOTE — Progress Notes (Signed)
Chief Complaint:   OBESITY [name] is here to discuss her progress with her obesity treatment plan along with follow-up of her obesity related diagnoses. [name] is on keeping a food journal and adhering to recommended goals of 1500-1600 calories and 120g  protein and states she is following her eating plan approximately 100% of the time. [name] states she is doing strength training 30-45 minutes 5 times per week.  Today's visit was #: 17 Starting weight: 199 lbs Starting date: 05/31/2020 Today's weight: 185 lbs Today's date: 03/21/2021 Total lbs lost to date: 14 lbs Total lbs lost since last in-office visit: 2 lbs  Interim History: [name] has enjoyed the two "PC/[place]" meals per week. This has helped her stay on plan with journaling. She will enjoy the PC/[place] meals during the weekend, with very strict adherence Mon-Fri to the journal plan. She is down 2 lbs since last OV!  Subjective:   1. Insulin resistance [name]'s insulin level is 14.9. she has tolerated Metformin 500 mg 1/2 tab with lunch. She denies GI upset. She reports decrease in cravings since starting biguanide.  Lab Results  Component Value Date   INSULIN 14.9 02/21/2021   INSULIN 17.8 10/24/2020   INSULIN 13.6 05/31/2020   Lab Results  Component Value Date   HGBA1C 5.0 02/21/2021    2. Hypothyroidism, unspecified type [name]'s 10/12/2020 TSH was 1.68. she denies increased fatigue levels. She is on levothyroxine 50 mcg QD. She is managed by Dr. Lucianne Muss at Endocrinology.  Lab Results  Component Value Date   TSH 1.68 10/12/2020    3. At risk for diarrhea [name] is at risk for diarrhea due to obesity and recently starting Metformin for insulin resistance and polyphagia.  Assessment/Plan:   1. Insulin resistance [name] will continue to work on weight loss, exercise, and decreasing simple carbohydrates to help decrease the risk of diabetes. [name] agreed to follow-up with [name] as directed to closely monitor her progress.  -  metFORMIN (GLUCOPHAGE) 500 MG tablet; 1/2 tablet by mouth at lunch  Dispense: 15 tablet; Refill: 0  2. Hypothyroidism, unspecified type Patient with long-standing hypothyroidism, on levothyroxine therapy. She appears euthyroid. Orders and follow up as documented in patient record. Continue current levothyroxine dose.  Counseling . Good thyroid control is important for overall health. Supratherapeutic thyroid levels are dangerous and will not improve weight loss results. . The correct way to take levothyroxine is fasting, with water, separated by at least 30 minutes from breakfast, and separated by more than 4 hours from calcium, iron, multivitamins, acid reflux medications (PPIs).   3. At risk for diarrhea [name] was given approximately 15 minutes of diarrhea prevention counseling today. She is 30 y.o. female and has risk factors for diarrhea including medications and changes in diet. We discussed intensive lifestyle modifications today with an emphasis on specific weight loss instructions including dietary strategies.   Repetitive spaced learning was employed today to elicit superior memory formation and behavioral change.  4. Class 1 obesity with serious comorbidity and body mass index (BMI) of 30.0 to 30.9 in adult, unspecified obesity type [name] is currently in the action stage of change. As such, her goal is to continue with weight loss efforts. She has agreed to keeping a food journal and adhering to recommended goals of 1500-1600 calories and 120 g protein.   Exercise goals: As is  Behavioral modification strategies: increasing lean protein intake, decreasing simple carbohydrates, meal planning and cooking strategies, planning for success and keeping a strict food  journal.  [name] has agreed to follow-up with our clinic in 2 weeks. She was informed of the importance of frequent follow-up visits to maximize her success with intensive lifestyle modifications for her multiple health  conditions.   Objective:   Blood pressure 128/78, pulse 69, temperature 98.1 F (36.7 C), height 5\' 5"  (1.651 m), weight 185 lb (83.9 kg), SpO2 98 %. Body mass index is 30.79 kg/m.  General: Cooperative, alert, well developed, in no acute distress. HEENT: Conjunctivae and lids unremarkable. Cardiovascular: Regular rhythm.  Lungs: Normal work of breathing. Neurologic: No focal deficits.   Lab Results  Component Value Date   CREATININE 0.86 02/21/2021   BUN 12 02/21/2021   NA 140 02/21/2021   K 4.3 02/21/2021   CL 103 02/21/2021   CO2 22 02/21/2021   Lab Results  Component Value Date   ALT 17 02/21/2021   AST 19 02/21/2021   ALKPHOS 60 02/21/2021   BILITOT 0.2 02/21/2021   Lab Results  Component Value Date   HGBA1C 5.0 02/21/2021   HGBA1C 5.0 05/31/2020   Lab Results  Component Value Date   INSULIN 14.9 02/21/2021   INSULIN 17.8 10/24/2020   INSULIN 13.6 05/31/2020   Lab Results  Component Value Date   TSH 1.68 10/12/2020   Lab Results  Component Value Date   CHOL 229 (H) 02/21/2021   HDL 59 02/21/2021   LDLCALC 154 (H) 02/21/2021   TRIG 93 02/21/2021   CHOLHDL 3.9 02/21/2021   Lab Results  Component Value Date   WBC 6.9 05/31/2020   HGB 14.5 05/31/2020   HCT 43.5 05/31/2020   MCV 91 05/31/2020   PLT 322 05/31/2020   Lab Results  Component Value Date   IRON 57 02/24/2019   FERRITIN 30.7 02/24/2019     Attestation Statements:   Reviewed by clinician on day of visit: allergies, medications, problem list, medical history, surgical history, family history, social history, and previous encounter notes.   02/26/2019, am acting as Edmund Hilda for Energy manager, NP.  I have reviewed the above documentation for accuracy and completeness, and I agree with the above. -  Neela Zecca d. Deklyn Trachtenberg, NP-C

## 2021-03-29 ENCOUNTER — Other Ambulatory Visit (INDEPENDENT_AMBULATORY_CARE_PROVIDER_SITE_OTHER): Payer: Self-pay | Admitting: Adult Health

## 2021-03-29 DIAGNOSIS — E8881 Metabolic syndrome: Secondary | ICD-10-CM

## 2021-04-06 ENCOUNTER — Encounter (INDEPENDENT_AMBULATORY_CARE_PROVIDER_SITE_OTHER): Payer: Self-pay | Admitting: Adult Health

## 2021-04-06 ENCOUNTER — Ambulatory Visit (INDEPENDENT_AMBULATORY_CARE_PROVIDER_SITE_OTHER): Payer: 59 | Admitting: Adult Health

## 2021-04-06 ENCOUNTER — Other Ambulatory Visit: Payer: Self-pay

## 2021-04-06 VITALS — BP 122/75 | HR 74 | Temp 97.9°F | Ht 65.0 in | Wt 185.0 lb

## 2021-04-06 DIAGNOSIS — E669 Obesity, unspecified: Secondary | ICD-10-CM | POA: Diagnosis not present

## 2021-04-06 DIAGNOSIS — K581 Irritable bowel syndrome with constipation: Secondary | ICD-10-CM

## 2021-04-06 DIAGNOSIS — E8881 Metabolic syndrome: Secondary | ICD-10-CM | POA: Diagnosis not present

## 2021-04-06 DIAGNOSIS — Z9189 Other specified personal risk factors, not elsewhere classified: Secondary | ICD-10-CM

## 2021-04-06 DIAGNOSIS — Z6833 Body mass index (BMI) 33.0-33.9, adult: Secondary | ICD-10-CM | POA: Diagnosis not present

## 2021-04-06 DIAGNOSIS — E88819 Insulin resistance, unspecified: Secondary | ICD-10-CM

## 2021-04-06 MED ORDER — METFORMIN HCL 500 MG PO TABS: ORAL_TABLET | ORAL | 0 refills | Status: DC

## 2021-04-10 ENCOUNTER — Encounter (INDEPENDENT_AMBULATORY_CARE_PROVIDER_SITE_OTHER): Payer: Self-pay | Admitting: Adult Health

## 2021-04-10 NOTE — Progress Notes (Signed)
Chief Complaint:   OBESITY [name] is here to discuss her progress with her obesity treatment plan along with follow-up of her obesity related diagnoses. [name] is on keeping a food journal and adhering to recommended goals of 1500-1600 calories and 120 g protein and states she is following her eating plan approximately 50% of the time. [name] states she is not currently exercising.  Today's visit was #: 18 Starting weight: 199 lbs Starting date: 05/31/2020 Today's weight: 185 lbs Today's date: 04/06/2021 Total lbs lost to date: 14 lbs Total lbs lost since last in-office visit: 0  Interim History: [name] was able to maintain her weight over the last several weeks- her birthday, work trip to [place], [place].  She followed [facility] while traveling, then immediately re-started journaling plan upon arrival home. [facility] in approx 4 weeks.    Subjective:   1. Insulin resistance [name] started Metformin 500 mg 1/2 tab QD on 03/07/2021 due to insulin resistance and late afternoon/early evening polyphagia- which has decreased since starting Metformin.  Lab Results  Component Value Date   INSULIN 14.9 02/21/2021   INSULIN 17.8 10/24/2020   INSULIN 13.6 05/31/2020   Lab Results  Component Value Date   HGBA1C 5.0 02/21/2021    2. Irritable bowel syndrome with constipation [name] has a history of IBS-Constipation. She denies worsening symptoms. She is not on any GI medications.  3. At risk for diarrhea [name] is at higher risk of diarrhea due to increasing Metformin dose, insulin resistance, and obesity.   Assessment/Plan:   1. Insulin resistance [name] will continue to work on weight loss, exercise, and decreasing simple carbohydrates to help decrease the risk of diabetes. [name] agreed to follow-up with [name] as directed to closely monitor her progress. Increase Metformin 500 mg, as prescribed below.  - metFORMIN (GLUCOPHAGE) 500 MG tablet; 1 tablet by mouth  at lunch  Dispense: 30 tablet; Refill: 0  2. Irritable bowel syndrome with constipation Remain well hydrated and eat a well balanced diet.  3. At risk for diarrhea [name] was given approximately 15 minutes of diarrhea prevention counseling today. She is 30 y.o. female and has risk factors for diarrhea including medications and changes in diet. We discussed intensive lifestyle modifications today with an emphasis on specific weight loss instructions including dietary strategies.   Repetitive spaced learning was employed today to elicit superior memory formation and behavioral change.  4. Current BMI 30.8 [name] is currently in the action stage of change. As such, her goal is to continue with weight loss efforts. She has agreed to keeping a food journal and adhering to recommended goals of 1500-1600 calories and 120 g protein.   Exercise goals: No exercise has been prescribed at this time.  Behavioral modification strategies: increasing lean protein intake, decreasing simple carbohydrates, meal planning and cooking strategies, avoiding temptations and planning for success.  [name] has agreed to follow-up with our clinic in 4 weeks. She was informed of the importance of frequent follow-up visits to maximize her success with intensive lifestyle modifications for her multiple health conditions.   Objective:   Blood pressure 122/75, pulse 74, temperature 97.9 F (36.6 C), height 5\' 5"  (1.651 m), weight 185 lb (83.9 kg), SpO2 100 %. Body mass index is 30.79 kg/m.  General: Cooperative, alert, well developed, in no acute distress. HEENT: Conjunctivae and lids unremarkable. Cardiovascular: Regular rhythm.  Lungs: Normal work of breathing. Neurologic: No focal deficits.   Lab Results  Component  Value Date   CREATININE 0.86 02/21/2021   BUN 12 02/21/2021   NA 140 02/21/2021   K 4.3 02/21/2021   CL 103 02/21/2021   CO2 22 02/21/2021   Lab Results  Component Value Date   ALT 17 02/21/2021    AST 19 02/21/2021   ALKPHOS 60 02/21/2021   BILITOT 0.2 02/21/2021   Lab Results  Component Value Date   HGBA1C 5.0 02/21/2021   HGBA1C 5.0 05/31/2020   Lab Results  Component Value Date   INSULIN 14.9 02/21/2021   INSULIN 17.8 10/24/2020   INSULIN 13.6 05/31/2020   Lab Results  Component Value Date   TSH 1.68 10/12/2020   Lab Results  Component Value Date   CHOL 229 (H) 02/21/2021   HDL 59 02/21/2021   LDLCALC 154 (H) 02/21/2021   TRIG 93 02/21/2021   CHOLHDL 3.9 02/21/2021   Lab Results  Component Value Date   WBC 6.9 05/31/2020   HGB 14.5 05/31/2020   HCT 43.5 05/31/2020   MCV 91 05/31/2020   PLT 322 05/31/2020   Lab Results  Component Value Date   IRON 57 02/24/2019   FERRITIN 30.7 02/24/2019   Attestation Statements:   Reviewed by clinician on day of visit: allergies, medications, problem list, medical history, surgical history, family history, social history, and previous encounter notes.   Edmund Hilda, am acting as Energy manager for William Hamburger, NP.  I have reviewed the above documentation for accuracy and completeness, and I agree with the above. -  Jailey Booton d. Iolani Twilley, NP-C

## 2021-04-10 NOTE — Telephone Encounter (Signed)
Forwarding this to you.

## 2021-04-11 ENCOUNTER — Ambulatory Visit (INDEPENDENT_AMBULATORY_CARE_PROVIDER_SITE_OTHER): Payer: 59 | Admitting: Obstetrics and Gynecology

## 2021-04-11 ENCOUNTER — Other Ambulatory Visit: Payer: Self-pay

## 2021-04-11 ENCOUNTER — Encounter: Payer: Self-pay | Admitting: Obstetrics and Gynecology

## 2021-04-11 VITALS — BP 138/82 | HR 65 | Ht 65.0 in | Wt 190.0 lb

## 2021-04-11 DIAGNOSIS — Z3041 Encounter for surveillance of contraceptive pills: Secondary | ICD-10-CM | POA: Diagnosis not present

## 2021-04-11 DIAGNOSIS — Z01419 Encounter for gynecological examination (general) (routine) without abnormal findings: Secondary | ICD-10-CM | POA: Diagnosis not present

## 2021-04-11 MED ORDER — SRONYX 0.1-20 MG-MCG PO TABS: 1.0000 | ORAL_TABLET | Freq: Every day | ORAL | 3 refills | Status: DC

## 2021-04-11 NOTE — Progress Notes (Signed)
Obstetrics and Gynecology Annual Patient Evaluation  Appointment Date: 04/11/2021  OBGYN Clinic: [facility]  Primary Care Provider: Gweneth Dimitri R  Referring Provider: Lynnda Child, MD  Chief Complaint:  Chief Complaint  Patient presents with  . Gynecologic Exam    History of Present Illness: [name] is a 30 y.o. Caucasian G3P1001 (No LMP recorded. (Menstrual status: Oral contraceptives).), seen for the above chief complaint. Her past medical history is significant for h/o ovarian cysts and h/o b/l ovarian cystectomies, insulin resistance, BMI 30s   Patient is doing well this morning and w/o complaints.   Review of Systems: A comprehensive review of systems was negative.    Patient Active Problem List   Diagnosis Date Noted  . Mixed hyperlipidemia 10/04/2020  . Insulin resistance 06/28/2020  . Hypothyroidism 05/31/2020  . Elevated LFTs 05/31/2020  . Depression 05/31/2020  . Class 1 obesity with serious comorbidity and body mass index (BMI) of 31.0 to 31.9 in adult 05/31/2020  . Female cystocele 02/25/2019  . Pelvic floor relaxation, s/p pelvic PT, without improvement 02/25/2019  . Genital herpes simplex, with rare outbreaks 02/25/2019  . IBS (irritable bowel syndrome) 02/25/2019  . Hyperstimulation of ovaries, with Hx of polycystic ovaries on [name] 05/05/2018  . Hypothyroidism, acquired, autoimmune, on Levothyroxine, followed by Endocrinology 11/25/2016  . Migraine, controlled with Topamax 11/01/2016    Past Medical History:  Past Medical History:  Diagnosis Date  . Fatty liver   . Headache    Migraines  . History of chicken pox   . Hypothyroidism   . IBS (irritable bowel syndrome)   . Microscopic colitis   . PCOS (polycystic ovarian syndrome)   . Urinary tract infection   . Vertigo     Past Surgical History:  Past Surgical History:  Procedure Laterality Date  . LAPAROSCOPIC OVARIAN CYSTECTOMY Bilateral 07/08/2019    Procedure: LAPAROSCOPIC OVARIAN CYSTECTOMY with peritoneal biopsies;  Surgeon: Allie Bossier, MD;  Location: [facility];  Service: Gynecology;  Laterality: Bilateral;  . TONSILLECTOMY     adenoidectomy  . WISDOM TOOTH EXTRACTION      Past Obstetrical History:  OB History  Gravida Para Term Preterm AB Living  3 1 1  0 0 1  SAB IAB Ectopic Multiple Live Births  0 0 0 0 1    # Outcome Date GA Lbr Len/2nd Weight Sex Delivery Anes PTL Lv  3 Term 05/09/12 [redacted]w[redacted]d 10:02 / 00:57 6 lb 3.7 oz (2.825 kg) F Vag-Spont Gen  LIV  2 Gravida           1 [redacted]w[redacted]d             Past Gynecological History: As per HPI. Periods: rarely. If it does happen, it's for a few days and it's light and not painful. She skips the placebo weeks but rarely will still have a period as described above. If she does not take the OCP, she gets migraines, bloating, etc History of Pap Smear(s): Yes.   Last pap 12/2019, which was neg and hpv neg  Social History:  Social History   Socioeconomic History  . Marital status: Married    Spouse name: Shanoah Asbill  . Number of children: 1  . Years of education: Not on file  . Highest education level: Not on file  Occupational History  . Occupation: Starting non profit  Tobacco Use  . Smoking status: Never Smoker  . Smokeless tobacco: Never Used  Vaping Use  .  Vaping Use: Never used  Substance and Sexual Activity  . Alcohol use: Yes    Alcohol/week: 0.0 standard drinks    Comment: a few times a year  . Drug use: No  . Sexual activity: Yes    Birth control/protection: Pill  Other Topics Concern  . 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 - [employer]      Family: daughter - Rodman Pickle (2014)      Enjoys: exercise, spend time with family      Exercise: strength training - mix of everything - 5 days a week, 1 hour   Diet: low calorie, healthy  diet and high protein diet      Safety   Seat belts: Yes    Guns: No   Safe in relationships: Yes    Social Determinants of Corporate investment banker Strain: Not on file  Food Insecurity: Not on file  Transportation Needs: Not on file  Physical Activity: Not on file  Stress: Not on file  Social Connections: Not on file  Intimate Partner Violence: Not on file    Family History:  Family History  Problem Relation Age of Onset  . Diabetes Maternal Grandmother   . Breast cancer Maternal Grandmother 39  . Parkinson's disease Maternal Grandmother   . Thyroid disease Father   . Sleep apnea Mother   . Obesity Mother   . Kidney disease Paternal Grandmother   . Thyroid disease Paternal Grandmother   . Healthy Brother   . Healthy Daughter   . Anesthesia problems Neg Hx    She denies any female cancers Medications Duanne Moron had no medications administered during this visit. Current Outpatient Medications  Medication Sig Dispense Refill  . levothyroxine (SYNTHROID) 50 MCG tablet TAKE 1 TABLET BY MOUTH EVERY DAY 30 tablet 5  . metFORMIN (GLUCOPHAGE) 500 MG tablet 1 tablet by mouth at lunch 30 tablet 0  . SRONYX 0.1-20 MG-MCG tablet Take 1 tablet by mouth daily. Skip placebo pills 120 tablet 3  . valACYclovir (VALTREX) 1000 MG tablet Take 1 tablet (1,000 mg total) by mouth 2 (two) times daily. (Patient not taking: Reported on 04/11/2021) 6 tablet 2   No current facility-administered medications for this visit.    Allergies Erythromycin and Penicillins   Physical Exam:  BP 138/82   Pulse 65   Ht 5\' 5"  (1.651 m)   Wt 190 lb (86.2 kg)   BMI 31.62 kg/m  Body mass index is 31.62 kg/m. General appearance: Well nourished, well developed female in no acute distress.  Neck:  Supple, normal appearance, and no thyromegaly  Cardiovascular: normal s1 and s2.  No murmurs, rubs or gallops. Respiratory:  Clear to auscultation bilateral. Normal respiratory effort Abdomen: positive  bowel sounds and no masses, hernias; diffusely non tender to palpation, non distended Breasts: patient denies any breast s/s. Neuro/Psych:  Normal mood and affect.  Skin:  Warm and dry.   Pelvic exam: declined  Laboratory: no new labs  Radiology: no new imaging  Assessment: pt doing well  Plan:  1. Well woman exam with routine gynecological exam Pt down 10lbs since I saw her last! Routine care. OCPs refilled  2. Encounter for surveillance of contraceptive pills  No orders of the defined types were placed in this encounter.   RTC 1 year  , Genesee Bing MD  Attending [facility] [employer] [facility] farm [facility])

## 2021-04-13 ENCOUNTER — Other Ambulatory Visit (INDEPENDENT_AMBULATORY_CARE_PROVIDER_SITE_OTHER): Payer: Self-pay | Admitting: Adult Health

## 2021-04-13 DIAGNOSIS — E8881 Metabolic syndrome: Secondary | ICD-10-CM

## 2021-04-13 DIAGNOSIS — E88819 Insulin resistance, unspecified: Secondary | ICD-10-CM

## 2021-04-13 MED ORDER — METFORMIN HCL 500 MG PO TABS: ORAL_TABLET | ORAL | 0 refills | Status: DC

## 2021-04-15 ENCOUNTER — Other Ambulatory Visit: Payer: Self-pay | Admitting: Endocrinology

## 2021-05-05 ENCOUNTER — Other Ambulatory Visit (INDEPENDENT_AMBULATORY_CARE_PROVIDER_SITE_OTHER): Payer: Self-pay | Admitting: Adult Health

## 2021-05-05 DIAGNOSIS — E8881 Metabolic syndrome: Secondary | ICD-10-CM

## 2021-05-08 ENCOUNTER — Ambulatory Visit (INDEPENDENT_AMBULATORY_CARE_PROVIDER_SITE_OTHER): Payer: 59 | Admitting: Adult Health

## 2021-05-08 ENCOUNTER — Encounter (INDEPENDENT_AMBULATORY_CARE_PROVIDER_SITE_OTHER): Payer: Self-pay | Admitting: Adult Health

## 2021-05-08 ENCOUNTER — Other Ambulatory Visit: Payer: Self-pay

## 2021-05-08 VITALS — BP 125/76 | HR 62 | Temp 98.0°F | Ht 65.0 in | Wt 186.0 lb

## 2021-05-08 DIAGNOSIS — E8881 Metabolic syndrome: Secondary | ICD-10-CM | POA: Diagnosis not present

## 2021-05-08 DIAGNOSIS — Z6831 Body mass index (BMI) 31.0-31.9, adult: Secondary | ICD-10-CM | POA: Diagnosis not present

## 2021-05-08 DIAGNOSIS — E782 Mixed hyperlipidemia: Secondary | ICD-10-CM | POA: Diagnosis not present

## 2021-05-08 DIAGNOSIS — E669 Obesity, unspecified: Secondary | ICD-10-CM

## 2021-05-08 NOTE — Telephone Encounter (Signed)
I spoke to the patient's mom a week or so ago, the patient doesn't qualify to come to our office.  Her BMI is only 23.85.

## 2021-05-08 NOTE — Progress Notes (Signed)
Chief Complaint:   OBESITY Lexey is here to discuss her progress with her obesity treatment plan along with follow-up of her obesity related diagnoses. Carisa is on the Category 3 Plan and keeping a food journal and adhering to recommended goals of 1500-1600 calories and 120 g protein and states she is following her eating plan approximately 90% of the time. Windy states she is doing cardio and strength training 45 minutes 5 times per week.  Today's visit was #: 19 Starting weight: 199 lbs Starting date: 05/31/2020 Today's weight: 186 lbs Today's date: 05/08/2021 Total lbs lost to date: 13 lbs Total lbs lost since last in-office visit: 0  Interim History: Wilmarie is tacking intake >90% of the time. On average, she consumes 100-120 grams protein per day and 1300-1600 calories per day.  Subjective:   1. Insulin resistance 02/21/2021 insulin level 14.9, normal BG 83, and A1c 5.0. Dorotha is on Metformin 500 mg at lunch. She notes it helps reduce afternoon cravings.  Lab Results  Component Value Date   INSULIN 14.9 02/21/2021   INSULIN 17.8 10/24/2020   INSULIN 13.6 05/31/2020   Lab Results  Component Value Date   HGBA1C 5.0 02/21/2021    2. Mixed hyperlipidemia 02/21/2021 lipid panel revealed total and LDL above goal. HDL is excellent at 59.  Lab Results  Component Value Date   ALT 17 02/21/2021   AST 19 02/21/2021   ALKPHOS 60 02/21/2021   BILITOT 0.2 02/21/2021   Lab Results  Component Value Date   CHOL 229 (H) 02/21/2021   HDL 59 02/21/2021   LDLCALC 154 (H) 02/21/2021   TRIG 93 02/21/2021   CHOLHDL 3.9 02/21/2021    Assessment/Plan:   1. Insulin resistance Riya will continue to work on weight loss, exercise, and decreasing simple carbohydrates to help decrease the risk of diabetes. Sarely agreed to follow-up with [name] as directed to closely monitor her progress. Check labs at next OV.  2. Mixed hyperlipidemia Cardiovascular risk and specific lipid/LDL goals  reviewed.  We discussed several lifestyle modifications today and Janele will continue to work on diet, exercise and weight loss efforts. Orders and follow up as documented in patient record. Check labs at next OV. Decrease saturated fats and continue regular exercise.  Counseling Intensive lifestyle modifications are the first line treatment for this issue. . Dietary changes: Increase soluble fiber. Decrease simple carbohydrates. . Exercise changes: Moderate to vigorous-intensity aerobic activity 150 minutes per week if tolerated. . Lipid-lowering medications: see documented in medical record.  3. Class 1 obesity with serious comorbidity and body mass index (BMI) of 31.0 to 31.9 in adult, unspecified obesity type  Javana is currently in the action stage of change. As such, her goal is to continue with weight loss efforts. She has agreed to keeping a food journal and adhering to recommended goals of 1500-1600 calories and 120 grams protein.   Check fasting labs at next OV.  Exercise goals: As is  Behavioral modification strategies: increasing lean protein intake, meal planning and cooking strategies and planning for success.  Benny has agreed to follow-up with our clinic in 3 weeks. She was informed of the importance of frequent follow-up visits to maximize her success with intensive lifestyle modifications for her multiple health conditions.   Objective:   Blood pressure 125/76, pulse 62, temperature 98 F (36.7 C), height 5\' 5"  (1.651 m), weight 186 lb (84.4 kg), SpO2 97 %. Body mass index is 30.95 kg/m.  General: Cooperative, alert, well  developed, in no acute distress. HEENT: Conjunctivae and lids unremarkable. Cardiovascular: Regular rhythm.  Lungs: Normal work of breathing. Neurologic: No focal deficits.   Lab Results  Component Value Date   CREATININE 0.86 02/21/2021   BUN 12 02/21/2021   NA 140 02/21/2021   K 4.3 02/21/2021   CL 103 02/21/2021   CO2 22 02/21/2021   Lab  Results  Component Value Date   ALT 17 02/21/2021   AST 19 02/21/2021   ALKPHOS 60 02/21/2021   BILITOT 0.2 02/21/2021   Lab Results  Component Value Date   HGBA1C 5.0 02/21/2021   HGBA1C 5.0 05/31/2020   Lab Results  Component Value Date   INSULIN 14.9 02/21/2021   INSULIN 17.8 10/24/2020   INSULIN 13.6 05/31/2020   Lab Results  Component Value Date   TSH 1.68 10/12/2020   Lab Results  Component Value Date   CHOL 229 (H) 02/21/2021   HDL 59 02/21/2021   LDLCALC 154 (H) 02/21/2021   TRIG 93 02/21/2021   CHOLHDL 3.9 02/21/2021   Lab Results  Component Value Date   WBC 6.9 05/31/2020   HGB 14.5 05/31/2020   HCT 43.5 05/31/2020   MCV 91 05/31/2020   PLT 322 05/31/2020   Lab Results  Component Value Date   IRON 57 02/24/2019   FERRITIN 30.7 02/24/2019     Attestation Statements:   Reviewed by clinician on day of visit: allergies, medications, problem list, medical history, surgical history, family history, social history, and previous encounter notes.  Time spent on visit including pre-visit chart review and post-visit care and charting was 31 minutes.   Edmund Hilda, CMA, am acting as transcriptionist for William Hamburger, NP.  I have reviewed the above documentation for accuracy and completeness, and I agree with the above. -  Livia Tarr d. Hiawatha Dressel, NP-C

## 2021-05-30 ENCOUNTER — Ambulatory Visit (INDEPENDENT_AMBULATORY_CARE_PROVIDER_SITE_OTHER): Payer: 59 | Admitting: Adult Health

## 2021-06-21 ENCOUNTER — Other Ambulatory Visit: Payer: Self-pay

## 2021-06-21 ENCOUNTER — Ambulatory Visit (INDEPENDENT_AMBULATORY_CARE_PROVIDER_SITE_OTHER): Payer: 59 | Admitting: Adult Health

## 2021-06-21 VITALS — BP 109/71 | HR 61 | Temp 98.3°F | Ht 65.0 in | Wt 182.0 lb

## 2021-06-21 DIAGNOSIS — Z0000 Encounter for general adult medical examination without abnormal findings: Secondary | ICD-10-CM | POA: Insufficient documentation

## 2021-06-21 DIAGNOSIS — Z9189 Other specified personal risk factors, not elsewhere classified: Secondary | ICD-10-CM

## 2021-06-21 DIAGNOSIS — E8881 Metabolic syndrome: Secondary | ICD-10-CM

## 2021-06-21 DIAGNOSIS — R7989 Other specified abnormal findings of blood chemistry: Secondary | ICD-10-CM

## 2021-06-21 DIAGNOSIS — E782 Mixed hyperlipidemia: Secondary | ICD-10-CM | POA: Diagnosis not present

## 2021-06-21 DIAGNOSIS — Z6833 Body mass index (BMI) 33.0-33.9, adult: Secondary | ICD-10-CM

## 2021-06-21 DIAGNOSIS — E669 Obesity, unspecified: Secondary | ICD-10-CM

## 2021-06-21 NOTE — Progress Notes (Signed)
Chief Complaint:   OBESITY [name] is here to discuss her progress with her obesity treatment plan along with follow-up of her obesity related diagnoses. [name] is on keeping a food journal and adhering to recommended goals of 1500-1600 calories and 120 g protein and states she is following her eating plan approximately 95% of the time. [name] states she is doing strength training and cardio 45 minutes 5 times per week.  Today's visit was #: 20 Starting weight: 199 lbs Starting date: 05/31/2020 Today's weight: 182 lbs Today's date: 06/21/2021 Total lbs lost to date: 17 Total lbs lost since last in-office visit: 4  Interim History: [name] has been consistently consuming 1450-1500 cal and 105 g protein daily.  She feels better with this number of cal/day and denies excessive hunger signals.  Subjective:   1. Insulin resistance [name] is on Metformin 500 mg QD at lunch.  Lab Results  Component Value Date   INSULIN 14.9 02/21/2021   INSULIN 17.8 10/24/2020   INSULIN 13.6 05/31/2020   Lab Results  Component Value Date   HGBA1C 5.0 02/21/2021   2. Mixed hyperlipidemia [name] is not on statin therapy.  Lab Results  Component Value Date   ALT 17 02/21/2021   AST 19 02/21/2021   ALKPHOS 60 02/21/2021   BILITOT 0.2 02/21/2021   Lab Results  Component Value Date   CHOL 229 (H) 02/21/2021   HDL 59 02/21/2021   LDLCALC 154 (H) 02/21/2021   TRIG 93 02/21/2021   CHOLHDL 3.9 02/21/2021   3. Elevated LFTs [name] denies acetaminophen or ETOH use.  Lab Results  Component Value Date   ALT 17 02/21/2021   AST 19 02/21/2021   ALKPHOS 60 02/21/2021   BILITOT 0.2 02/21/2021   4. Healthcare maintenance [name] is taking Juice Plus.  5. At risk for diabetes mellitus [name] is at higher than average risk for developing diabetes due to obesity.   Assessment/Plan:   1. Insulin resistance [name] will continue to work on weight loss, exercise, and decreasing simple carbohydrates to help  decrease the risk of diabetes. [name] agreed to follow-up with [name] as directed to closely monitor her progress. Check labs today.  - Hemoglobin A1c - Insulin, random  2. Mixed hyperlipidemia Cardiovascular risk and specific lipid/LDL goals reviewed.  We discussed several lifestyle modifications today and [name] will continue to work on diet, exercise and weight loss efforts. Orders and follow up as documented in patient record.  Check labs today.  Counseling Intensive lifestyle modifications are the first line treatment for this issue. Dietary changes: Increase soluble fiber. Decrease simple carbohydrates. Exercise changes: Moderate to vigorous-intensity aerobic activity 150 minutes per week if tolerated. Lipid-lowering medications: see documented in medical record.  - Lipid panel  3. Elevated LFTs We discussed the likely diagnosis of non-alcoholic fatty liver disease today and how this condition is obesity related. [name] was educated the importance of weight loss. [name] agreed to continue with her weight loss efforts with healthier diet and exercise as an essential part of her treatment plan. Check labs today.  - Comprehensive metabolic panel  4. Healthcare maintenance Check labs today.  - VITAMIN D 25 Hydroxy (Vit-D Deficiency, Fractures)  5. At risk for diabetes mellitus [name] was given approximately 15 minutes of diabetes education and counseling today. We discussed intensive lifestyle modifications today with an emphasis on weight loss as well as increasing exercise and decreasing simple carbohydrates in her diet. We also reviewed medication options with an emphasis on risk  versus benefit of those discussed.   Repetitive spaced learning was employed today to elicit superior memory formation and behavioral change.  6. Current BMI 30.4  [name] is currently in the action stage of change. As such, her goal is to continue with weight loss efforts. She has agreed to keeping a food  journal and adhering to recommended goals of 1500-1600 calories and 120 g protein.   Check IC at next OV- pt is aware to arrive 30 minutes prior to OV.  Exercise goals:  As is  Behavioral modification strategies: increasing lean protein intake, decreasing simple carbohydrates, meal planning and cooking strategies, keeping healthy foods in the home, planning for success, and keeping a strict food journal.  [name] has agreed to follow-up with our clinic in 3-4 weeks. She was informed of the importance of frequent follow-up visits to maximize her success with intensive lifestyle modifications for her multiple health conditions.   [name] was informed we would discuss her lab results at her next visit unless there is a critical issue that needs to be addressed sooner. [name] agreed to keep her next visit at the agreed upon time to discuss these results.  Objective:   Blood pressure 109/71, pulse 61, temperature 98.3 F (36.8 C), height 5\' 5"  (1.651 m), weight 182 lb (82.6 kg), SpO2 99 %. Body mass index is 30.29 kg/m.  General: Cooperative, alert, well developed, in no acute distress. HEENT: Conjunctivae and lids unremarkable. Cardiovascular: Regular rhythm.  Lungs: Normal work of breathing. Neurologic: No focal deficits.   Lab Results  Component Value Date   CREATININE 0.86 02/21/2021   BUN 12 02/21/2021   NA 140 02/21/2021   K 4.3 02/21/2021   CL 103 02/21/2021   CO2 22 02/21/2021   Lab Results  Component Value Date   ALT 17 02/21/2021   AST 19 02/21/2021   ALKPHOS 60 02/21/2021   BILITOT 0.2 02/21/2021   Lab Results  Component Value Date   HGBA1C 5.0 02/21/2021   HGBA1C 5.0 05/31/2020   Lab Results  Component Value Date   INSULIN 14.9 02/21/2021   INSULIN 17.8 10/24/2020   INSULIN 13.6 05/31/2020   Lab Results  Component Value Date   TSH 1.68 10/12/2020   Lab Results  Component Value Date   CHOL 229 (H) 02/21/2021   HDL 59 02/21/2021   LDLCALC 154 (H) 02/21/2021    TRIG 93 02/21/2021   CHOLHDL 3.9 02/21/2021   Lab Results  Component Value Date   WBC 6.9 05/31/2020   HGB 14.5 05/31/2020   HCT 43.5 05/31/2020   MCV 91 05/31/2020   PLT 322 05/31/2020   Lab Results  Component Value Date   IRON 57 02/24/2019   FERRITIN 30.7 02/24/2019    Attestation Statements:   Reviewed by clinician on day of visit: allergies, medications, problem list, medical history, surgical history, family history, social history, and previous encounter notes.  02/26/2019, CMA, am acting as transcriptionist for Edmund Hilda, NP.  I have reviewed the above documentation for accuracy and completeness, and I agree with the above. -  Shandie Bertz d. Kiondra Caicedo, NP-C

## 2021-06-22 LAB — LIPID PANEL
Chol/HDL Ratio: 3.3 ratio (ref 0.0–4.4)
Cholesterol, Total: 203 mg/dL — ABNORMAL HIGH (ref 100–199)
HDL: 62 mg/dL (ref >39)
LDL Chol Calc (NIH): 126 mg/dL — ABNORMAL HIGH (ref 0–99)
Triglycerides: 86 mg/dL (ref 0–149)
VLDL Cholesterol Cal: 15 mg/dL (ref 5–40)

## 2021-06-22 LAB — COMPREHENSIVE METABOLIC PANEL
ALT: 26 IU/L (ref 0–32)
AST: 20 IU/L (ref 0–40)
Albumin/Globulin Ratio: 1.7 (ref 1.2–2.2)
Albumin: 4.7 g/dL (ref 3.8–4.8)
Alkaline Phosphatase: 77 IU/L (ref 44–121)
BUN/Creatinine Ratio: 15 (ref 9–23)
BUN: 14 mg/dL (ref 6–20)
Bilirubin Total: 0.3 mg/dL (ref 0.0–1.2)
CO2: 23 mmol/L (ref 20–29)
Calcium: 9.4 mg/dL (ref 8.7–10.2)
Chloride: 103 mmol/L (ref 96–106)
Creatinine, Ser: 0.96 mg/dL (ref 0.57–1.00)
Globulin, Total: 2.8 g/dL (ref 1.5–4.5)
Glucose: 75 mg/dL (ref 65–99)
Potassium: 4.7 mmol/L (ref 3.5–5.2)
Sodium: 141 mmol/L (ref 134–144)
Total Protein: 7.5 g/dL (ref 6.0–8.5)
eGFR: 81 mL/min/{1.73_m2} (ref >59)

## 2021-06-22 LAB — HEMOGLOBIN A1C
Est. average glucose Bld gHb Est-mCnc: 105 mg/dL
Hgb A1c MFr Bld: 5.3 % (ref 4.8–5.6)

## 2021-06-22 LAB — VITAMIN D 25 HYDROXY (VIT D DEFICIENCY, FRACTURES): Vit D, 25-Hydroxy: 44.3 ng/mL (ref 30.0–100.0)

## 2021-06-22 LAB — INSULIN, RANDOM: INSULIN: 10.6 u[IU]/mL (ref 2.6–24.9)

## 2021-07-06 ENCOUNTER — Encounter: Payer: Self-pay | Admitting: Emergency Medicine

## 2021-07-06 ENCOUNTER — Other Ambulatory Visit: Payer: Self-pay

## 2021-07-06 ENCOUNTER — Ambulatory Visit
Admission: EM | Admit: 2021-07-06 | Discharge: 2021-07-06 | Disposition: A | Discharge status: Home / Self Care | Payer: 59 | Attending: Emergency Medicine | Admitting: Emergency Medicine

## 2021-07-06 DIAGNOSIS — B349 Viral infection, unspecified: Secondary | ICD-10-CM

## 2021-07-06 LAB — POCT RAPID STREP A (OFFICE): Rapid Strep A Screen: NEGATIVE

## 2021-07-06 NOTE — Discharge Instructions (Addendum)
Your rapid strep test is negative.  A throat culture is pending; we will call you if it is positive requiring treatment.    Your COVID test is pending.  You should self quarantine until the test result is back.    Take Tylenol or ibuprofen as needed for fever or discomfort.  Rest and keep yourself hydrated.    Follow-up with your primary care provider if your symptoms are not improving.     

## 2021-07-06 NOTE — ED Triage Notes (Signed)
Patient c/o sore throat and chills x 2 days.  Patient endorses LFT sided shoulder/chest pain that started yesterday, with no worsening pain with movement.   Patient endorses headache and fever at home. Patient reports a temperature of 100 F at home at it's highest  Patient endorses worsening throat pain with and lymph node tenderness and swelling.   Patient endorses LFT sided nasal pain.   Patient reports taking an at home COVID test with negative test results.   Patient took Excedrin, Dayquil, and Nyquil with some relief of symptoms at times.

## 2021-07-06 NOTE — ED Provider Notes (Signed)
Renaldo Fiddler    CSN: [id number] Arrival date & time: 07/06/21  5188      History   Chief Complaint Chief Complaint  Patient presents with   Sore Throat   Chills    HPI [name] is a 30 y.o. female.  Patient presents with 2-day history of sore throat, fever, chills, headache, nasal congestion, postnasal drip, sinus pressure, mild nonproductive cough, body aches.  T-max 100 at home 2 days ago; no fever since then.  She denies rash, shortness of breath, vomiting, diarrhea, or other symptoms.  Treatment attempted at home with NyQuil and DayQuil.  Patient reports her daughter had the flu a couple of weeks ago.  Her medical history includes migraine headaches, vertigo, IBS, hypothyroidism, obesity, HSV.  The history is provided by the patient and medical records.   Past Medical History:  Diagnosis Date   Fatty liver    Headache    Migraines   History of chicken pox    Hypothyroidism    IBS (irritable bowel syndrome)    Microscopic colitis    PCOS (polycystic ovarian syndrome)    Urinary tract infection    Vertigo     Patient Active Problem List   Diagnosis Date Noted   Healthcare maintenance 06/21/2021   Mixed hyperlipidemia 10/04/2020   Insulin resistance 06/28/2020   Hypothyroidism 05/31/2020   Elevated LFTs 05/31/2020   Depression 05/31/2020   Class 1 obesity with serious comorbidity and body mass index (BMI) of 33.0 to 33.9 in adult 05/31/2020   Female cystocele 02/25/2019   Pelvic floor relaxation, s/p pelvic PT, without improvement 02/25/2019   Genital herpes simplex, with rare outbreaks 02/25/2019   IBS (irritable bowel syndrome) 02/25/2019   Hyperstimulation of ovaries, with Hx of polycystic ovaries on [name] 05/05/2018   Hypothyroidism, acquired, autoimmune, on Levothyroxine, followed by Endocrinology 11/25/2016   Migraine, controlled with Topamax 11/01/2016    Past Surgical History:  Procedure Laterality Date   LAPAROSCOPIC OVARIAN CYSTECTOMY  Bilateral 07/08/2019   Procedure: LAPAROSCOPIC OVARIAN CYSTECTOMY with peritoneal biopsies;  Surgeon: Allie Bossier, MD;  Location: [facility];  Service: Gynecology;  Laterality: Bilateral;   TONSILLECTOMY     adenoidectomy   WISDOM TOOTH EXTRACTION      OB History     Gravida  3   Para  1   Term  1   Preterm  0   AB  0   Living  1      SAB  0   IAB  0   Ectopic  0   Multiple  0   Live Births  1            Home Medications    Prior to Admission medications   Medication Sig Start Date End Date Taking? Authorizing Provider  levothyroxine (SYNTHROID) 50 MCG tablet TAKE 1 TABLET BY MOUTH EVERY DAY 04/15/21  Yes Reather Littler, MD  metFORMIN (GLUCOPHAGE) 500 MG tablet 1 tablet by mouth at lunch 04/13/21  Yes Danford, Katy D, NP  SRONYX 0.1-20 MG-MCG tablet Take 1 tablet by mouth daily. Skip placebo pills 04/11/21  Yes [place] Bing, MD  valACYclovir (VALTREX) 1000 MG tablet Take 1 tablet (1,000 mg total) by mouth 2 (two) times daily. 07/01/19   Helane Rima, DO    Family History Family History  Problem Relation Age of Onset   Diabetes Maternal Grandmother    Breast cancer Maternal Grandmother 60   Parkinson's disease Maternal Grandmother    Thyroid  disease Father    Sleep apnea Mother    Obesity Mother    Kidney disease Paternal Grandmother    Thyroid disease Paternal Grandmother    Healthy Brother    Healthy Daughter    Anesthesia problems Neg Hx     Social History Social History   Tobacco Use   Smoking status: Never   Smokeless tobacco: Never  Vaping Use   Vaping Use: Never used  Substance Use Topics   Alcohol use: Yes    Alcohol/week: 0.0 standard drinks    Comment: a few times a year   Drug use: No     Allergies   Erythromycin and Penicillins   Review of Systems Review of Systems  Constitutional:  Positive for chills and fever.  HENT:  Positive for congestion, postnasal drip, rhinorrhea and sore throat. Negative for ear  pain.   Eyes:  Negative for pain and visual disturbance.  Respiratory:  Positive for cough. Negative for shortness of breath.   Cardiovascular:  Negative for chest pain and palpitations.  Gastrointestinal:  Negative for abdominal pain and vomiting.  Genitourinary:  Negative for dysuria and hematuria.  Musculoskeletal:  Negative for arthralgias and back pain.  Skin:  Negative for color change and rash.  Neurological:  Positive for headaches. Negative for seizures and syncope.  All other systems reviewed and are negative.   Physical Exam Triage Vital Signs ED Triage Vitals  Enc Vitals Group     BP 07/06/21 0926 137/88     Pulse Rate 07/06/21 0926 79     Resp 07/06/21 0926 14     Temp 07/06/21 0926 98.8 F (37.1 C)     Temp Source 07/06/21 0926 Oral     SpO2 07/06/21 0926 97 %     Weight --      Height --      Head Circumference --      Peak Flow --      Pain Score 07/06/21 0922 3     Pain Loc --      Pain Edu? --      Excl. in GC? --    No data found.  Updated Vital Signs BP 137/88 (BP Location: Right Arm)   Pulse 79   Temp 98.8 F (37.1 C) (Oral)   Resp 14   LMP  (LMP Unknown) Comment: "taking the active pills only"  SpO2 97%   Visual Acuity Right Eye Distance:   Left Eye Distance:   Bilateral Distance:    Right Eye Near:   Left Eye Near:    Bilateral Near:     Physical Exam Vitals and nursing note reviewed.  Constitutional:      General: She is not in acute distress.    Appearance: She is well-developed.  HENT:     Head: Normocephalic and atraumatic.     Right Ear: Tympanic membrane normal.     Left Ear: Tympanic membrane normal.     Nose: Rhinorrhea present.     Mouth/Throat:     Mouth: Mucous membranes are moist.     Pharynx: Posterior oropharyngeal erythema present.  Eyes:     Conjunctiva/sclera: Conjunctivae normal.  Cardiovascular:     Rate and Rhythm: Normal rate and regular rhythm.     Heart sounds: Normal heart sounds.  Pulmonary:      Effort: Pulmonary effort is normal. No respiratory distress.     Breath sounds: Normal breath sounds.  Abdominal:     Palpations: Abdomen is soft.  Tenderness: There is no abdominal tenderness.  Musculoskeletal:     Cervical back: Neck supple.  Skin:    General: Skin is warm and dry.  Neurological:     General: No focal deficit present.     Mental Status: She is alert and oriented to person, place, and time.     Gait: Gait normal.  Psychiatric:        Mood and Affect: Mood normal.        Behavior: Behavior normal.     UC Treatments / Results  Labs (all labs ordered are listed, but only abnormal results are displayed) Labs Reviewed  CULTURE, GROUP A STREP ([facility])  COVID-19, FLU A+B NAA  POCT RAPID STREP A (OFFICE)    EKG   Radiology No results found.  Procedures Procedures (including critical care time)  Medications Ordered in UC Medications - No data to display  Initial Impression / Assessment and Plan / UC Course  I have reviewed the triage vital signs and the nursing notes.  Pertinent labs & imaging results that were available during my care of the patient were reviewed by me and considered in my medical decision making (see chart for details).   Viral illness.  Rapid strep negative; culture pending.  Patient reports recent exposure to influenza.  COVID and Flu pending.  Instructed patient to self quarantine per CDC guidelines.  Discussed symptomatic treatment including Tylenol or ibuprofen, rest, hydration.  Instructed patient to follow up with PCP if symptoms are not improving.  Patient agrees to plan of care.   Final Clinical Impressions(s) / UC Diagnoses   Final diagnoses:  Viral illness     Discharge Instructions      Your rapid strep test is negative.  A throat culture is pending; we will call you if it is positive requiring treatment.    Your COVID test is pending.  You should self quarantine until the test result is back.    Take Tylenol or  ibuprofen as needed for fever or discomfort.  Rest and keep yourself hydrated.    Follow-up with your primary care provider if your symptoms are not improving.         ED Prescriptions   None    PDMP not reviewed this encounter.   Mickie Bail, NP 07/06/21 [phone]

## 2021-07-08 LAB — COVID-19, FLU A+B NAA
Influenza A, NAA: NOT DETECTED
Influenza B, NAA: NOT DETECTED
SARS-CoV-2, NAA: DETECTED — AB

## 2021-07-09 LAB — CULTURE, GROUP A STREP (THRC)

## 2021-07-14 ENCOUNTER — Encounter (INDEPENDENT_AMBULATORY_CARE_PROVIDER_SITE_OTHER): Payer: Self-pay | Admitting: Physician Assistant

## 2021-07-17 ENCOUNTER — Ambulatory Visit (INDEPENDENT_AMBULATORY_CARE_PROVIDER_SITE_OTHER): Payer: 59 | Admitting: Physician Assistant

## 2021-07-17 ENCOUNTER — Other Ambulatory Visit (INDEPENDENT_AMBULATORY_CARE_PROVIDER_SITE_OTHER): Payer: Self-pay | Admitting: Adult Health

## 2021-07-17 ENCOUNTER — Encounter (INDEPENDENT_AMBULATORY_CARE_PROVIDER_SITE_OTHER): Payer: Self-pay | Admitting: Physician Assistant

## 2021-07-17 ENCOUNTER — Other Ambulatory Visit: Payer: Self-pay

## 2021-07-17 VITALS — BP 130/84 | HR 66 | Temp 98.0°F | Ht 65.0 in | Wt 183.0 lb

## 2021-07-17 DIAGNOSIS — E669 Obesity, unspecified: Secondary | ICD-10-CM

## 2021-07-17 DIAGNOSIS — Z6833 Body mass index (BMI) 33.0-33.9, adult: Secondary | ICD-10-CM | POA: Diagnosis not present

## 2021-07-17 DIAGNOSIS — E8881 Metabolic syndrome: Secondary | ICD-10-CM

## 2021-07-17 DIAGNOSIS — E559 Vitamin D deficiency, unspecified: Secondary | ICD-10-CM | POA: Diagnosis not present

## 2021-07-17 DIAGNOSIS — Z9189 Other specified personal risk factors, not elsewhere classified: Secondary | ICD-10-CM

## 2021-07-17 DIAGNOSIS — E7849 Other hyperlipidemia: Secondary | ICD-10-CM

## 2021-07-17 DIAGNOSIS — E66811 Obesity, class 1: Secondary | ICD-10-CM

## 2021-07-17 MED ORDER — VITAMIN D3 25 MCG (1000 UT) PO CAPS: ORAL_CAPSULE | ORAL | 0 refills | Status: DC

## 2021-07-17 NOTE — Telephone Encounter (Signed)
Seeing [name] today 

## 2021-07-25 NOTE — Progress Notes (Signed)
Chief Complaint:   OBESITY [name] is here to discuss her progress with her obesity treatment plan along with follow-up of her obesity related diagnoses. [name] is on keeping a food journal and adhering to recommended goals of 1500-1600 calories and 120 grams ofd protein daily and states she is following her eating plan approximately 25% of the time. [name] states she is walking 1 mile 2 times per week.   Today's visit was #: 21 Starting weight: 199 lbs Starting date: 05/31/2020 Today's weight: 183 lbs Today's date: 07/17/2021 Total lbs lost to date: 16 Total lbs lost since last in-office visit: 0  Interim History: [name] is recovering from COVID and reports that her appetite has been low. Today is the first day that she has her energy back. She meal prepped yesterday and she is ready to get back on track.  Subjective:   1. Other hyperlipidemia [name]'s levels have improved, and she is not on medications. I discussed labs with the patient today.  2. Vitamin D deficiency [name] is not on Vit D supplementation. Last Vit D level was 44.3. I discussed labs with the patient today  3. At risk for heart disease [name] is at a higher than average risk for cardiovascular disease due to obesity.   Assessment/Plan:   1. Other hyperlipidemia Cardiovascular risk and specific lipid/LDL goals reviewed. We discussed several lifestyle modifications today. Jashayla will continue her meal plan, exercise, and weight loss efforts. Orders and follow up as documented in patient record.   Counseling Intensive lifestyle modifications are the first line treatment for this issue. Dietary changes: Increase soluble fiber. Decrease simple carbohydrates. Exercise changes: Moderate to vigorous-intensity aerobic activity 150 minutes per week if tolerated. Lipid-lowering medications: see documented in medical record.  2. Vitamin D deficiency Low Vitamin D level contributes to fatigue and are associated with obesity,  breast, and colon cancer. Artavia agreed to add Vitamin D OTC 1,000 units daily, and will follow-up for routine testing of Vitamin D, at least 2-3 times per year to avoid over-replacement.  - Cholecalciferol (VITAMIN D3) 25 MCG (1000 UT) CAPS; One cap daily (OTC)  Dispense: 60 capsule; Refill: 0  3. At risk for heart disease [name] was given approximately 15 minutes of coronary artery disease prevention counseling today. She is 30 y.o. female and has risk factors for heart disease including obesity. We discussed intensive lifestyle modifications today with an emphasis on specific weight loss instructions and strategies.   Repetitive spaced learning was employed today to elicit superior memory formation and behavioral change.  4. Current BMI 30.45 [name] is currently in the action stage of change. As such, her goal is to continue with weight loss efforts. She has agreed to keeping a food journal and adhering to recommended goals of 1400-1500 calories and 120 grams of protein daily.   We will recheck her IC at her next visit.  Exercise goals: As is.  Behavioral modification strategies: increasing lean protein intake and no skipping meals.  [name] has agreed to follow-up with our clinic in 2 weeks. She was informed of the importance of frequent follow-up visits to maximize her success with intensive lifestyle modifications for her multiple health conditions.   Objective:   Blood pressure 130/84, pulse 66, temperature 98 F (36.7 C), height 5\' 5"  (1.651 m), weight 183 lb (83 kg), SpO2 99 %. Body mass index is 30.45 kg/m.  General: Cooperative, alert, well developed, in no acute distress. HEENT: Conjunctivae and lids unremarkable. Cardiovascular: Regular rhythm.  Lungs: Normal work of breathing. Neurologic: No focal deficits.   Lab Results  Component Value Date   CREATININE 0.96 06/21/2021   BUN 14 06/21/2021   NA 141 06/21/2021   K 4.7 06/21/2021   CL 103 06/21/2021   CO2 23 06/21/2021    Lab Results  Component Value Date   ALT 26 06/21/2021   AST 20 06/21/2021   ALKPHOS 77 06/21/2021   BILITOT 0.3 06/21/2021   Lab Results  Component Value Date   HGBA1C 5.3 06/21/2021   HGBA1C 5.0 02/21/2021   HGBA1C 5.0 05/31/2020   Lab Results  Component Value Date   INSULIN 10.6 06/21/2021   INSULIN 14.9 02/21/2021   INSULIN 17.8 10/24/2020   INSULIN 13.6 05/31/2020   Lab Results  Component Value Date   TSH 1.68 10/12/2020   Lab Results  Component Value Date   CHOL 203 (H) 06/21/2021   HDL 62 06/21/2021   LDLCALC 126 (H) 06/21/2021   TRIG 86 06/21/2021   CHOLHDL 3.3 06/21/2021   Lab Results  Component Value Date   VD25OH 44.3 06/21/2021   VD25OH 76.1 05/31/2020   VD25OH 21.23 (L) 04/18/2016   Lab Results  Component Value Date   WBC 6.9 05/31/2020   HGB 14.5 05/31/2020   HCT 43.5 05/31/2020   MCV 91 05/31/2020   PLT 322 05/31/2020   Lab Results  Component Value Date   IRON 57 02/24/2019   FERRITIN 30.7 02/24/2019   Attestation Statements:   Reviewed by clinician on day of visit: allergies, medications, problem list, medical history, surgical history, family history, social history, and previous encounter notes.   Trude Mcburney, am acting as transcriptionist for [employer], PA-C.  I have reviewed the above documentation for accuracy and completeness, and I agree with the above. Alois Cliche, PA-C

## 2021-08-07 ENCOUNTER — Other Ambulatory Visit: Payer: Self-pay

## 2021-08-07 ENCOUNTER — Ambulatory Visit (INDEPENDENT_AMBULATORY_CARE_PROVIDER_SITE_OTHER): Payer: 59 | Admitting: Adult Health

## 2021-08-07 ENCOUNTER — Encounter (INDEPENDENT_AMBULATORY_CARE_PROVIDER_SITE_OTHER): Payer: Self-pay | Admitting: Adult Health

## 2021-08-07 VITALS — BP 111/76 | HR 74 | Temp 98.0°F | Ht 65.0 in | Wt 183.0 lb

## 2021-08-07 DIAGNOSIS — E669 Obesity, unspecified: Secondary | ICD-10-CM

## 2021-08-07 DIAGNOSIS — Z6833 Body mass index (BMI) 33.0-33.9, adult: Secondary | ICD-10-CM | POA: Diagnosis not present

## 2021-08-07 DIAGNOSIS — R0602 Shortness of breath: Secondary | ICD-10-CM | POA: Diagnosis not present

## 2021-08-07 DIAGNOSIS — E8881 Metabolic syndrome: Secondary | ICD-10-CM

## 2021-08-07 DIAGNOSIS — Z9189 Other specified personal risk factors, not elsewhere classified: Secondary | ICD-10-CM | POA: Diagnosis not present

## 2021-08-08 NOTE — Progress Notes (Signed)
Chief Complaint:   OBESITY [name] is here to discuss her progress with her obesity treatment plan along with follow-up of her obesity related diagnoses. [name] is on keeping a food journal and adhering to recommended goals of 1400-1500 calories and 120 grams protein and states she is following her eating plan approximately 50% of the time. [name] states she is not currently exercising.  Today's visit was #: 22 Starting weight: 199 lbs Starting date: 05/31/2020 Today's weight: 183 lbs Today's date: 08/07/2021 Total lbs lost to date: 16 Total lbs lost since last in-office visit: 0  Interim History: [name] recently recovered from COVID-19 infection.  07/06/2021-she had a positive PCR COVID-19 test with a negative flu A/B. She has no current acute symptoms.  [name] continues to be challenged to stay on track at dinner time. Of Note: Her home scale will consistently weigh 2 lbs heavier than our scales.  Subjective:   1. Shortness of breath on exertion [name] reports mild SOBE with extreme exertion. She denies chest pain/tightness with exertion.  RMR 05/31/2020- 1896 and today RMR 1685. Decreased 211 cal.  2. Insulin resistance [name] is on Metformin 500 mg at lunch. 06/21/2021 insulin level was 10.6.  3. At risk for hyperglycemia [name] is at risk for hyperglycemia due to insulin resistance.  Assessment/Plan:   1. Shortness of breath on exertion [name] does feel that she gets out of breath more easily that she used to when she exercises. [name]'s shortness of breath appears to be obesity related and exercise induced. She has agreed to work on weight loss and gradually increase exercise to treat her exercise induced shortness of breath. Will continue to monitor closely. IC checked and results reviewed at length with pt.  2. Insulin resistance [name] will continue to work on weight loss, exercise, and decreasing simple carbohydrates to help decrease the risk of diabetes. [name] agreed to follow-up  with [name] as directed to closely monitor her progress. Continue daily Metformin and increased exercise (20-30 minutes).  3. At risk for hyperglycemia [name] was given approximately 15 minutes of counseling today regarding prevention of hyperglycemia. She was advised of hyperglycemia causes and the fact hyperglycemia is often asymptomatic. [name] was instructed to avoid skipping meals, eat regular protein rich meals and schedule low calorie but protein rich snacks as needed.   Repetitive spaced learning was employed today to elicit superior memory formation and behavioral change   4. Current BMI 30.5  [name] is currently in the action stage of change. As such, her goal is to continue with weight loss efforts. She has agreed to keeping a food journal and adhering to recommended goals of 1150-1250 calories and 95 grams protein.   Decrease total calorie intake from 1400-1500 to 1100-1200 due to reduced RMR. Handout: Recipe Guide II  Exercise goals:  Increase daily walking  Behavioral modification strategies: increasing lean protein intake, decreasing simple carbohydrates, keeping healthy foods in the home, ways to avoid boredom eating, ways to avoid night time snacking, avoiding temptations, and planning for success.  [name] has agreed to follow-up with our clinic in 3 weeks. She was informed of the importance of frequent follow-up visits to maximize her success with intensive lifestyle modifications for her multiple health conditions.   Objective:   Blood pressure 111/76, pulse 74, temperature 98 F (36.7 C), height 5\' 5"  (1.651 m), weight 183 lb (83 kg), SpO2 98 %. Body mass index is 30.45 kg/m.  General: Cooperative, alert, well developed, in no acute distress. HEENT: Conjunctivae  and lids unremarkable. Cardiovascular: Regular rhythm.  Lungs: Normal work of breathing. Neurologic: No focal deficits.   Lab Results  Component Value Date   CREATININE 0.96 06/21/2021   BUN 14 06/21/2021   NA  141 06/21/2021   K 4.7 06/21/2021   CL 103 06/21/2021   CO2 23 06/21/2021   Lab Results  Component Value Date   ALT 26 06/21/2021   AST 20 06/21/2021   ALKPHOS 77 06/21/2021   BILITOT 0.3 06/21/2021   Lab Results  Component Value Date   HGBA1C 5.3 06/21/2021   HGBA1C 5.0 02/21/2021   HGBA1C 5.0 05/31/2020   Lab Results  Component Value Date   INSULIN 10.6 06/21/2021   INSULIN 14.9 02/21/2021   INSULIN 17.8 10/24/2020   INSULIN 13.6 05/31/2020   Lab Results  Component Value Date   TSH 1.68 10/12/2020   Lab Results  Component Value Date   CHOL 203 (H) 06/21/2021   HDL 62 06/21/2021   LDLCALC 126 (H) 06/21/2021   TRIG 86 06/21/2021   CHOLHDL 3.3 06/21/2021   Lab Results  Component Value Date   VD25OH 44.3 06/21/2021   VD25OH 76.1 05/31/2020   VD25OH 21.23 (L) 04/18/2016   Lab Results  Component Value Date   WBC 6.9 05/31/2020   HGB 14.5 05/31/2020   HCT 43.5 05/31/2020   MCV 91 05/31/2020   PLT 322 05/31/2020   Lab Results  Component Value Date   IRON 57 02/24/2019   FERRITIN 30.7 02/24/2019   Attestation Statements:   Reviewed by clinician on day of visit: allergies, medications, problem list, medical history, surgical history, family history, social history, and previous encounter notes.  Edmund Hilda, CMA, am acting as transcriptionist for William Hamburger, NP.  I have reviewed the above documentation for accuracy and completeness, and I agree with the above. -  Tresean Mattix d. Miranda Garber, NP-C

## 2021-08-21 ENCOUNTER — Other Ambulatory Visit: Payer: Self-pay | Admitting: Obstetrics & Gynecology

## 2021-08-29 ENCOUNTER — Other Ambulatory Visit: Payer: Self-pay

## 2021-08-29 ENCOUNTER — Ambulatory Visit (INDEPENDENT_AMBULATORY_CARE_PROVIDER_SITE_OTHER): Payer: 59 | Admitting: Adult Health

## 2021-08-29 ENCOUNTER — Encounter (INDEPENDENT_AMBULATORY_CARE_PROVIDER_SITE_OTHER): Payer: Self-pay | Admitting: Adult Health

## 2021-08-29 VITALS — BP 123/75 | HR 60 | Temp 98.3°F | Ht 65.0 in | Wt 182.0 lb

## 2021-08-29 DIAGNOSIS — E669 Obesity, unspecified: Secondary | ICD-10-CM

## 2021-08-29 DIAGNOSIS — E8881 Metabolic syndrome: Secondary | ICD-10-CM

## 2021-08-29 DIAGNOSIS — Z6833 Body mass index (BMI) 33.0-33.9, adult: Secondary | ICD-10-CM

## 2021-08-29 NOTE — Progress Notes (Signed)
Chief Complaint:   OBESITY [name] is here to discuss her progress with her obesity treatment plan along with follow-up of her obesity related diagnoses. [name] is on keeping a food journal and adhering to recommended goals of 1150-1250 calories and 95 grams of protein and states she is following her eating plan approximately 90% of the time. [name] states she is doing strengthening class for 45-60 minutes 6 times per week.  Today's visit was #: 23 Starting weight: 199 lbs Starting date: 05/31/2020 Today's weight: 182 lbs Today's date: 08/29/2021 Total lbs lost to date: 17 lbs Total lbs lost since last in-office visit: 1 lb  Interim History: [name]'s calories and protein were adjusted at the last office visit due to reduced RMR via IC (1686).  She has resumed regular exercise either early am or right after she taker her daughter,Cassidy, to school.  Of note - [name] is in the 4th grade this year.  Subjective:   1. Insulin resistance [name]'s insulin level from 06/21/2021 was 10.6 slightly elevated was normal. Her blood glucose level was 75 and normal. Her A1C level was 5.3. She will take Metformin 500 mg daily 3-4 times a week.  Assessment/Plan:   1. Insulin resistance [name] will find time of the day to consistently take Metformin with food. She will continue to work on weight loss, exercise, and decreasing simple carbohydrates to help decrease the risk of diabetes. [name] agreed to follow-up with [name] as directed to closely monitor her progress.   2. Current BMI 30.4 [name] is currently in the action stage of change. As such, her goal is to continue with weight loss efforts. She has agreed to keeping a food journal and adhering to recommended goals of 1150-1250 calories and 95 grams of protein daily.  Handouts: High protein, low calories and protein content of foods were given to [name] today.  Exercise goals:  [name] will continue 8 week strength challenge.  Behavioral modification  strategies: increasing lean protein intake, decreasing simple carbohydrates, meal planning and cooking strategies, keeping healthy foods in the home, planning for success, and keeping a strict food journal.  [name] has agreed to follow-up with our clinic in 3 weeks. She was informed of the importance of frequent follow-up visits to maximize her success with intensive lifestyle modifications for her multiple health conditions.   Objective:   Blood pressure 123/75, pulse 60, temperature 98.3 F (36.8 C), height 5\' 5"  (1.651 m), weight 182 lb (82.6 kg), SpO2 96 %. Body mass index is 30.29 kg/m.  General: Cooperative, alert, well developed, in no acute distress. HEENT: Conjunctivae and lids unremarkable. Cardiovascular: Regular rhythm.  Lungs: Normal work of breathing. Neurologic: No focal deficits.   Lab Results  Component Value Date   CREATININE 0.96 06/21/2021   BUN 14 06/21/2021   NA 141 06/21/2021   K 4.7 06/21/2021   CL 103 06/21/2021   CO2 23 06/21/2021   Lab Results  Component Value Date   ALT 26 06/21/2021   AST 20 06/21/2021   ALKPHOS 77 06/21/2021   BILITOT 0.3 06/21/2021   Lab Results  Component Value Date   HGBA1C 5.3 06/21/2021   HGBA1C 5.0 02/21/2021   HGBA1C 5.0 05/31/2020   Lab Results  Component Value Date   INSULIN 10.6 06/21/2021   INSULIN 14.9 02/21/2021   INSULIN 17.8 10/24/2020   INSULIN 13.6 05/31/2020   Lab Results  Component Value Date   TSH 1.68 10/12/2020   Lab Results  Component Value Date  CHOL 203 (H) 06/21/2021   HDL 62 06/21/2021   LDLCALC 126 (H) 06/21/2021   TRIG 86 06/21/2021   CHOLHDL 3.3 06/21/2021   Lab Results  Component Value Date   VD25OH 44.3 06/21/2021   VD25OH 76.1 05/31/2020   VD25OH 21.23 (L) 04/18/2016   Lab Results  Component Value Date   WBC 6.9 05/31/2020   HGB 14.5 05/31/2020   HCT 43.5 05/31/2020   MCV 91 05/31/2020   PLT 322 05/31/2020   Lab Results  Component Value Date   IRON 57 02/24/2019    FERRITIN 30.7 02/24/2019   Attestation Statements:   Reviewed by clinician on day of visit: allergies, medications, problem list, medical history, surgical history, family history, social history, and previous encounter notes.  Time spent on visit including pre-visit chart review and post-visit care and charting was 28 minutes.   I, Jackson Latino, RMA, am acting as Energy manager for William Hamburger, NP.   I have reviewed the above documentation for accuracy and completeness, and I agree with the above. -  Andon Villard d. Misao Fackrell, NP-C

## 2021-09-28 ENCOUNTER — Ambulatory Visit (INDEPENDENT_AMBULATORY_CARE_PROVIDER_SITE_OTHER): Payer: 59 | Admitting: Adult Health

## 2021-10-02 ENCOUNTER — Ambulatory Visit (INDEPENDENT_AMBULATORY_CARE_PROVIDER_SITE_OTHER): Payer: 59 | Admitting: Family Medicine

## 2021-10-02 ENCOUNTER — Encounter (INDEPENDENT_AMBULATORY_CARE_PROVIDER_SITE_OTHER): Payer: Self-pay | Admitting: Family Medicine

## 2021-10-02 ENCOUNTER — Other Ambulatory Visit: Payer: Self-pay

## 2021-10-02 VITALS — BP 127/82 | HR 67 | Temp 98.2°F | Ht 65.0 in | Wt 185.0 lb

## 2021-10-02 DIAGNOSIS — E669 Obesity, unspecified: Secondary | ICD-10-CM

## 2021-10-02 DIAGNOSIS — E8881 Metabolic syndrome: Secondary | ICD-10-CM | POA: Diagnosis not present

## 2021-10-02 DIAGNOSIS — E559 Vitamin D deficiency, unspecified: Secondary | ICD-10-CM | POA: Diagnosis not present

## 2021-10-02 DIAGNOSIS — Z6830 Body mass index (BMI) 30.0-30.9, adult: Secondary | ICD-10-CM

## 2021-10-02 DIAGNOSIS — F3289 Other specified depressive episodes: Secondary | ICD-10-CM

## 2021-10-03 ENCOUNTER — Encounter (INDEPENDENT_AMBULATORY_CARE_PROVIDER_SITE_OTHER): Payer: Self-pay | Admitting: Family Medicine

## 2021-10-03 MED ORDER — BUPROPION HCL ER (SR) 150 MG PO TB12: 150.0000 mg | ORAL_TABLET | Freq: Every morning | ORAL | 0 refills | Status: DC

## 2021-10-03 MED ORDER — VITAMIN D (ERGOCALCIFEROL) 1.25 MG (50000 UNIT) PO CAPS: 50000.0000 [IU] | ORAL_CAPSULE | ORAL | 0 refills | Status: DC

## 2021-10-09 NOTE — Progress Notes (Signed)
Chief Complaint:   OBESITY [name] is here to discuss her progress with her obesity treatment plan along with follow-up of her obesity related diagnoses. [name] is on keeping a food journal and adhering to recommended goals of 1150-1250 calories and 95 grams of protein daily and states she is following her eating plan approximately 50% of the time. [name] states she is doing weight training for 60 minutes 5 times per week.  Today's visit was #: 24 Starting weight: 199 lbs Starting date: 05/31/2020 Today's weight: 185 lbs Today's date: 10/02/2021 Total lbs lost to date: 14 Total lbs lost since last in-office visit: 0  Interim History: [name] has struggled more in the last month with her eating plan. She is ready to get back on track, and looking at options to help her.   Subjective:   1. Insulin resistance [name] is tolerating metformin, and she is working on diet and exercise. She is taking metformin later in the day.  2. Vitamin D deficiency [name] is on OTC Vit D, but her level is not yet at goal.   3. Other depression with emotional eating [name] notes some emotional eating behaviors, worse recently. She is open to discussing medication options to help her stay on track.  Assessment/Plan:   1. Insulin resistance [name] agreed to change metformin to AM and will see if this helps decrease polyphagia. She will continue to work on weight loss, exercise, and decreasing simple carbohydrates to help decrease the risk of diabetes. [name] agreed to follow-up with [name] as directed to closely monitor her progress.  2. Vitamin D deficiency Low Vitamin D level contributes to fatigue and are associated with obesity, breast, and colon cancer. [name] agreed to continue OTC Vit D, and start prescription Vitamin D 50,000 IU every week with no refills. We will recheck labs in 2-3 months, and she will follow-up for routine testing of Vitamin D, at least 2-3 times per year to avoid over-replacement.  -  Vitamin D, Ergocalciferol, (DRISDOL) 1.25 MG (50000 UNIT) CAPS capsule; Take 1 capsule (50,000 Units total) by mouth every 7 (seven) days.  Dispense: 4 capsule; Refill: 0  3. Other depression with emotional eating Behavior modification techniques were discussed today to help [name] deal with her emotional/non-hunger eating behaviors. [name] agreed to start Wellbutrin SR 150 mg q AM with no refills. Orders and follow up as documented in patient record.   - buPROPion (WELLBUTRIN SR) 150 MG 12 hr tablet; Take 1 tablet (150 mg total) by mouth every morning.  Dispense: 30 tablet; Refill: 0  4. Obesity with current BMI 30.8 [name] is currently in the action stage of change. As such, her goal is to continue with weight loss efforts. She has agreed to keeping a food journal and adhering to recommended goals of 1200 calories and 80+ grams of protein daily.   Exercise goals: As is.  Behavioral modification strategies: increasing lean protein intake and emotional eating strategies.  [name] has agreed to follow-up with our clinic in 2 weeks. She was informed of the importance of frequent follow-up visits to maximize her success with intensive lifestyle modifications for her multiple health conditions.   Objective:   Blood pressure 127/82, pulse 67, temperature 98.2 F (36.8 C), height 5\' 5"  (1.651 m), weight 185 lb (83.9 kg), SpO2 99 %. Body mass index is 30.79 kg/m.  General: Cooperative, alert, well developed, in no acute distress. HEENT: Conjunctivae and lids unremarkable. Cardiovascular: Regular rhythm.  Lungs: Normal work of breathing. Neurologic:  No focal deficits.   Lab Results  Component Value Date   CREATININE 0.96 06/21/2021   BUN 14 06/21/2021   NA 141 06/21/2021   K 4.7 06/21/2021   CL 103 06/21/2021   CO2 23 06/21/2021   Lab Results  Component Value Date   ALT 26 06/21/2021   AST 20 06/21/2021   ALKPHOS 77 06/21/2021   BILITOT 0.3 06/21/2021   Lab Results  Component Value  Date   HGBA1C 5.3 06/21/2021   HGBA1C 5.0 02/21/2021   HGBA1C 5.0 05/31/2020   Lab Results  Component Value Date   INSULIN 10.6 06/21/2021   INSULIN 14.9 02/21/2021   INSULIN 17.8 10/24/2020   INSULIN 13.6 05/31/2020   Lab Results  Component Value Date   TSH 1.68 10/12/2020   Lab Results  Component Value Date   CHOL 203 (H) 06/21/2021   HDL 62 06/21/2021   LDLCALC 126 (H) 06/21/2021   TRIG 86 06/21/2021   CHOLHDL 3.3 06/21/2021   Lab Results  Component Value Date   VD25OH 44.3 06/21/2021   VD25OH 76.1 05/31/2020   VD25OH 21.23 (L) 04/18/2016   Lab Results  Component Value Date   WBC 6.9 05/31/2020   HGB 14.5 05/31/2020   HCT 43.5 05/31/2020   MCV 91 05/31/2020   PLT 322 05/31/2020   Lab Results  Component Value Date   IRON 57 02/24/2019   FERRITIN 30.7 02/24/2019   Attestation Statements:   Reviewed by clinician on day of visit: allergies, medications, problem list, medical history, surgical history, family history, social history, and previous encounter notes.  Time spent on visit including pre-visit chart review and post-visit care and charting was 45 minutes.    I, Burt Knack, am acting as transcriptionist for Quillian Quince, MD.  I have reviewed the above documentation for accuracy and completeness, and I agree with the above. -  Quillian Quince, MD

## 2021-10-16 ENCOUNTER — Ambulatory Visit (INDEPENDENT_AMBULATORY_CARE_PROVIDER_SITE_OTHER): Payer: 59 | Admitting: Family Medicine

## 2021-10-16 DIAGNOSIS — E669 Obesity, unspecified: Secondary | ICD-10-CM

## 2021-10-17 ENCOUNTER — Other Ambulatory Visit: Payer: Self-pay

## 2021-10-17 ENCOUNTER — Ambulatory Visit (INDEPENDENT_AMBULATORY_CARE_PROVIDER_SITE_OTHER): Payer: 59 | Admitting: Family Medicine

## 2021-10-17 ENCOUNTER — Encounter (INDEPENDENT_AMBULATORY_CARE_PROVIDER_SITE_OTHER): Payer: Self-pay | Admitting: Family Medicine

## 2021-10-17 ENCOUNTER — Other Ambulatory Visit: Payer: Self-pay | Admitting: Endocrinology

## 2021-10-17 VITALS — BP 121/75 | HR 78 | Temp 97.7°F | Ht 65.0 in | Wt 179.0 lb

## 2021-10-17 DIAGNOSIS — N643 Galactorrhea not associated with childbirth: Secondary | ICD-10-CM | POA: Diagnosis not present

## 2021-10-17 DIAGNOSIS — E559 Vitamin D deficiency, unspecified: Secondary | ICD-10-CM | POA: Diagnosis not present

## 2021-10-17 DIAGNOSIS — E669 Obesity, unspecified: Secondary | ICD-10-CM | POA: Diagnosis not present

## 2021-10-17 DIAGNOSIS — F3289 Other specified depressive episodes: Secondary | ICD-10-CM

## 2021-10-17 DIAGNOSIS — Z6833 Body mass index (BMI) 33.0-33.9, adult: Secondary | ICD-10-CM

## 2021-10-17 MED ORDER — VITAMIN D (ERGOCALCIFEROL) 1.25 MG (50000 UNIT) PO CAPS: 50000.0000 [IU] | ORAL_CAPSULE | ORAL | 0 refills | Status: DC

## 2021-10-17 MED ORDER — BUPROPION HCL ER (SR) 150 MG PO TB12: 150.0000 mg | ORAL_TABLET | Freq: Every morning | ORAL | 0 refills | Status: DC

## 2021-10-17 NOTE — Progress Notes (Signed)
Chief Complaint:   OBESITY [name] is here to discuss her progress with her obesity treatment plan along with follow-up of her obesity related diagnoses. [name] is on keeping a food journal and adhering to recommended goals of 1200 calories and 80 grams of protein and states she is following her eating plan approximately 100% of the time. [name] states she is doing strength for 45 minutes 4 times per week.  Today's visit was #: 25 Starting weight: 199 lbs Starting date: 05/31/2020 Today's weight: 179 lbs Today's date: 10/17/2021 Total lbs lost to date: 20 lbs Total lbs lost since last in-office visit: 6 lbs  Interim History: [name] is very happy with the effect of the bupropion. It has really helped control her appetite and cravings. She says the bupropion promotes better food choices.  She averages 90-100 grams of protein per day. She is journaling consistently. Her average calories are 1050-1250.  Subjective:   1. Inappropriate lactation [name] noticed that if she squeezes on her breast there is clear liquid that comes out of the nipple. Her breasts are not sore but feel firmer.  She is not currently breast feeding. This started after starting bupropion.   2. Vitamin D deficiency [name]'s Vitamin D is low at 44.3. She is on weekly prescription Vitamin D.   Lab Results  Component Value Date   VD25OH 44.3 06/21/2021   VD25OH 76.1 05/31/2020   VD25OH 21.23 (L) 04/18/2016    3. Other depression with emotional eating [name] feels the bupropion has helped tremendously. Her snacking is much less. Her blood pressure is within normal limits. She has been able to make better choices with the bupropion.   Assessment/Plan   1. Inappropriate lactation A referral for was put in eor Endocrinology for [name].    - Ambulatory referral to Endocrinology  2. Vitamin D deficiency . We will refill prescription Vitamin D 50,000 IU every week and [name] will follow-up for routine testing of Vitamin  D, at least 2-3 times per year to avoid over-replacement.  - Vitamin D, Ergocalciferol, (DRISDOL) 1.25 MG (50000 UNIT) CAPS capsule; Take 1 capsule (50,000 Units total) by mouth every 7 (seven) days.  Dispense: 4 capsule; Refill: 0  3. Other depression with emotional eating Behavior modification techniques were discussed today to help [name] deal with her emotional/non-hunger eating behaviors.  We will refill bupropion 150 mg every morning for 1 month with no refills. Orders and follow up as documented in patient record.    - buPROPion (WELLBUTRIN SR) 150 MG 12 hr tablet; Take 1 tablet (150 mg total) by mouth every morning.  Dispense: 30 tablet; Refill: 0  4. Overweight with current BMI 29.79 [name] is currently in the action stage of change. As such, her goal is to continue with weight loss efforts. She has agreed to keeping a food journal and adhering to recommended goals of 1200 calories and 80 grams of protein daily.   Exercise goals:  As is.  Behavioral modification strategies: planning for success.  [name] has agreed to follow-up with our clinic in 2 weeks.  Objective:   Blood pressure 121/75, pulse 78, temperature 97.7 F (36.5 C), height 5\' 5"  (1.651 m), weight 179 lb (81.2 kg), SpO2 99 %. Body mass index is 29.79 kg/m.  General: Cooperative, alert, well developed, in no acute distress. HEENT: Conjunctivae and lids unremarkable. Cardiovascular: Regular rhythm.  Lungs: Normal work of breathing. Neurologic: No focal deficits.   Lab Results  Component Value Date   CREATININE  0.96 06/21/2021   BUN 14 06/21/2021   NA 141 06/21/2021   K 4.7 06/21/2021   CL 103 06/21/2021   CO2 23 06/21/2021   Lab Results  Component Value Date   ALT 26 06/21/2021   AST 20 06/21/2021   ALKPHOS 77 06/21/2021   BILITOT 0.3 06/21/2021   Lab Results  Component Value Date   HGBA1C 5.3 06/21/2021   HGBA1C 5.0 02/21/2021   HGBA1C 5.0 05/31/2020   Lab Results  Component Value Date   INSULIN  10.6 06/21/2021   INSULIN 14.9 02/21/2021   INSULIN 17.8 10/24/2020   INSULIN 13.6 05/31/2020   Lab Results  Component Value Date   TSH 1.68 10/12/2020   Lab Results  Component Value Date   CHOL 203 (H) 06/21/2021   HDL 62 06/21/2021   LDLCALC 126 (H) 06/21/2021   TRIG 86 06/21/2021   CHOLHDL 3.3 06/21/2021   Lab Results  Component Value Date   VD25OH 44.3 06/21/2021   VD25OH 76.1 05/31/2020   VD25OH 21.23 (L) 04/18/2016   Lab Results  Component Value Date   WBC 6.9 05/31/2020   HGB 14.5 05/31/2020   HCT 43.5 05/31/2020   MCV 91 05/31/2020   PLT 322 05/31/2020   Lab Results  Component Value Date   IRON 57 02/24/2019   FERRITIN 30.7 02/24/2019   Attestation Statements:   Reviewed by clinician on day of visit: allergies, medications, problem list, medical history, surgical history, family history, social history, and previous encounter notes.  I, Jackson Latino, RMA, am acting as Energy manager for [place], FNP.   I have reviewed the above documentation for accuracy and completeness, and I agree with the above. -  Jesse Sans, FNP

## 2021-10-20 DIAGNOSIS — E559 Vitamin D deficiency, unspecified: Secondary | ICD-10-CM | POA: Insufficient documentation

## 2021-10-20 DIAGNOSIS — N643 Galactorrhea not associated with childbirth: Secondary | ICD-10-CM | POA: Insufficient documentation

## 2021-11-02 ENCOUNTER — Other Ambulatory Visit: Payer: Self-pay

## 2021-11-02 ENCOUNTER — Telehealth: Payer: Self-pay | Admitting: Endocrinology

## 2021-11-02 ENCOUNTER — Ambulatory Visit (INDEPENDENT_AMBULATORY_CARE_PROVIDER_SITE_OTHER): Payer: 59 | Admitting: Family Medicine

## 2021-11-02 ENCOUNTER — Encounter (INDEPENDENT_AMBULATORY_CARE_PROVIDER_SITE_OTHER): Payer: Self-pay | Admitting: Family Medicine

## 2021-11-02 VITALS — BP 129/80 | HR 72 | Temp 98.2°F | Ht 65.0 in | Wt 180.0 lb

## 2021-11-02 DIAGNOSIS — E8881 Metabolic syndrome: Secondary | ICD-10-CM

## 2021-11-02 DIAGNOSIS — Z9189 Other specified personal risk factors, not elsewhere classified: Secondary | ICD-10-CM

## 2021-11-02 DIAGNOSIS — E559 Vitamin D deficiency, unspecified: Secondary | ICD-10-CM

## 2021-11-02 DIAGNOSIS — N643 Galactorrhea not associated with childbirth: Secondary | ICD-10-CM | POA: Diagnosis not present

## 2021-11-02 DIAGNOSIS — Z6833 Body mass index (BMI) 33.0-33.9, adult: Secondary | ICD-10-CM

## 2021-11-02 DIAGNOSIS — E669 Obesity, unspecified: Secondary | ICD-10-CM | POA: Diagnosis not present

## 2021-11-02 MED ORDER — METFORMIN HCL 500 MG PO TABS: 500.0000 mg | ORAL_TABLET | Freq: Every day | ORAL | 0 refills | Status: DC

## 2021-11-02 NOTE — Telephone Encounter (Signed)
I can see her tomorrow at 8:20 AM-I need 40-minutes.

## 2021-11-02 NOTE — Progress Notes (Signed)
Chief Complaint:   OBESITY [name] is here to discuss her progress with her obesity treatment plan along with follow-up of her obesity related diagnoses. [name] is on keeping a food journal and adhering to recommended goals of 1200 calories and 80 grams of protein daily and states she is following her eating plan approximately 90% of the time. [name] states she is doing strength training for 45 minutes 5 times per week.  Today's visit was #: 26 Starting weight: 199 lbs Starting date: 05/31/2020 Today's weight: 180 lbs Today's date: 11/02/2021 Total lbs lost to date: 19 Total lbs lost since last in-office visit: 0  Interim History: [name] continues to do well with weight loss. She did well with minimizing Halloween candy eating. She is mindful of her food choices.  Subjective:   1. Vitamin D deficiency [name] is on OTC Vit D, and she is due for labs soon.  2. Insulin resistance [name] is stable on Metformin and she is doing well with diet and exercise.  3. Inappropriate lactation [name] notes a small amount of clear fluid which can be expressed from both of her nipples. A referral was placed to Endocrinology, but the patient has not heard from them yet.  4. At risk for heart disease [name] is at a higher than average risk for cardiovascular disease due to obesity.   Assessment/Plan:   1. Vitamin D deficiency Low Vitamin D level contributes to fatigue and are associated with obesity, breast, and colon cancer. We will continue to manage her Vit D and we will plan to recheck labs in 2 months. [name] will follow-up for routine testing of Vitamin D, at least 2-3 times per year to avoid over-replacement.  2. Insulin resistance [name] will continue to work on weight loss, exercise, and decreasing simple carbohydrates to help decrease the risk of diabetes. We will refill metformin 500 mg q AM #30 for 1 month, and we will recheck labs in 2 months. [name] agreed to follow-up with [name] as directed to  closely monitor her progress.  3. Inappropriate lactation We will refer [name] to Dr. Elvera Lennox for evaluation.  - Ambulatory referral to Endocrinology  4. At risk for heart disease [name] was given approximately 15 minutes of coronary artery disease prevention counseling today. She is 30 y.o. female and has risk factors for heart disease including obesity. We discussed intensive lifestyle modifications today with an emphasis on specific weight loss instructions and strategies.   Repetitive spaced learning was employed today to elicit superior memory formation and behavioral change.  5. Obesity with current BMI of 30 [name] is currently in the action stage of change. As such, her goal is to continue with weight loss efforts. She has agreed to keeping a food journal and adhering to recommended goals of 1200 calories and 80+ grams of protein daily.   Exercise goals: As is.  Behavioral modification strategies: holiday eating strategies .  [name] has agreed to follow-up with our clinic in 2 weeks. She was informed of the importance of frequent follow-up visits to maximize her success with intensive lifestyle modifications for her multiple health conditions.   Objective:   Blood pressure 129/80, pulse 72, temperature 98.2 F (36.8 C), height 5\' 5"  (1.651 m), weight 180 lb (81.6 kg), SpO2 98 %. Body mass index is 29.95 kg/m.  General: Cooperative, alert, well developed, in no acute distress. HEENT: Conjunctivae and lids unremarkable. Cardiovascular: Regular rhythm.  Lungs: Normal work of breathing. Neurologic: No focal deficits.   Lab  Results  Component Value Date   CREATININE 0.96 06/21/2021   BUN 14 06/21/2021   NA 141 06/21/2021   K 4.7 06/21/2021   CL 103 06/21/2021   CO2 23 06/21/2021   Lab Results  Component Value Date   ALT 26 06/21/2021   AST 20 06/21/2021   ALKPHOS 77 06/21/2021   BILITOT 0.3 06/21/2021   Lab Results  Component Value Date   HGBA1C 5.3 06/21/2021    HGBA1C 5.0 02/21/2021   HGBA1C 5.0 05/31/2020   Lab Results  Component Value Date   INSULIN 10.6 06/21/2021   INSULIN 14.9 02/21/2021   INSULIN 17.8 10/24/2020   INSULIN 13.6 05/31/2020   Lab Results  Component Value Date   TSH 1.68 10/12/2020   Lab Results  Component Value Date   CHOL 203 (H) 06/21/2021   HDL 62 06/21/2021   LDLCALC 126 (H) 06/21/2021   TRIG 86 06/21/2021   CHOLHDL 3.3 06/21/2021   Lab Results  Component Value Date   VD25OH 44.3 06/21/2021   VD25OH 76.1 05/31/2020   VD25OH 21.23 (L) 04/18/2016   Lab Results  Component Value Date   WBC 6.9 05/31/2020   HGB 14.5 05/31/2020   HCT 43.5 05/31/2020   MCV 91 05/31/2020   PLT 322 05/31/2020   Lab Results  Component Value Date   IRON 57 02/24/2019   FERRITIN 30.7 02/24/2019   Attestation Statements:   Reviewed by clinician on day of visit: allergies, medications, problem list, medical history, surgical history, family history, social history, and previous encounter notes.   I, Burt Knack, am acting as transcriptionist for Quillian Quince, MD.  I have reviewed the above documentation for accuracy and completeness, and I agree with the above. -  Quillian Quince, MD

## 2021-11-02 NOTE — Telephone Encounter (Signed)
Patient returned call and stated that she was unable to schedule an appointment for tomorrow. Patient is scheduled for an appointment with Dr. Elvera Lennox on 12/07/21 at 8:20 am.

## 2021-11-02 NOTE — Telephone Encounter (Signed)
Pt would feel more comfortable with a woman physician. Prefers Dr Elvera Lennox. But would be okay with [facility], too.   Pt contact [phone]

## 2021-11-02 NOTE — Telephone Encounter (Signed)
FYI

## 2021-11-02 NOTE — Telephone Encounter (Signed)
Called and left a message for pt to call back to scheduled appt if available tomorrow.

## 2021-11-16 ENCOUNTER — Encounter (INDEPENDENT_AMBULATORY_CARE_PROVIDER_SITE_OTHER): Payer: Self-pay | Admitting: Family Medicine

## 2021-11-16 ENCOUNTER — Ambulatory Visit (INDEPENDENT_AMBULATORY_CARE_PROVIDER_SITE_OTHER): Payer: 59 | Admitting: Family Medicine

## 2021-11-16 ENCOUNTER — Other Ambulatory Visit: Payer: Self-pay

## 2021-11-16 ENCOUNTER — Ambulatory Visit (INDEPENDENT_AMBULATORY_CARE_PROVIDER_SITE_OTHER): Payer: 59 | Admitting: Adult Health

## 2021-11-16 VITALS — BP 120/77 | HR 73 | Temp 98.1°F | Ht 65.0 in | Wt 177.0 lb

## 2021-11-16 DIAGNOSIS — F3289 Other specified depressive episodes: Secondary | ICD-10-CM

## 2021-11-16 DIAGNOSIS — E559 Vitamin D deficiency, unspecified: Secondary | ICD-10-CM | POA: Diagnosis not present

## 2021-11-16 DIAGNOSIS — E7849 Other hyperlipidemia: Secondary | ICD-10-CM

## 2021-11-16 DIAGNOSIS — E669 Obesity, unspecified: Secondary | ICD-10-CM

## 2021-11-16 DIAGNOSIS — Z9189 Other specified personal risk factors, not elsewhere classified: Secondary | ICD-10-CM | POA: Diagnosis not present

## 2021-11-16 DIAGNOSIS — N643 Galactorrhea not associated with childbirth: Secondary | ICD-10-CM

## 2021-11-16 DIAGNOSIS — E039 Hypothyroidism, unspecified: Secondary | ICD-10-CM

## 2021-11-16 DIAGNOSIS — E8881 Metabolic syndrome: Secondary | ICD-10-CM

## 2021-11-16 DIAGNOSIS — Z6833 Body mass index (BMI) 33.0-33.9, adult: Secondary | ICD-10-CM

## 2021-11-16 MED ORDER — VITAMIN D (ERGOCALCIFEROL) 1.25 MG (50000 UNIT) PO CAPS: 50000.0000 [IU] | ORAL_CAPSULE | ORAL | 0 refills | Status: DC

## 2021-11-16 MED ORDER — BUPROPION HCL ER (SR) 150 MG PO TB12: 150.0000 mg | ORAL_TABLET | Freq: Every morning | ORAL | 0 refills | Status: DC

## 2021-11-16 MED ORDER — METFORMIN HCL 500 MG PO TABS: 500.0000 mg | ORAL_TABLET | Freq: Every day | ORAL | 0 refills | Status: DC

## 2021-11-16 NOTE — Progress Notes (Signed)
Chief Complaint:   OBESITY [name] is here to discuss her progress with her obesity treatment plan along with follow-up of her obesity related diagnoses. [name] is on keeping a food journal and adhering to recommended goals of 1200 calories and 80+ grams of protein daily and states she is following her eating plan approximately 90% of the time. [name] states she is doing strength training for 45 minutes 5 times per week.  Today's visit was #: 46 Starting weight: 199 lbs Starting date: 05/31/2020 Today's weight: 177 lbs Today's date: 11/16/2021 Total lbs lost to date: 22 Total lbs lost since last in-office visit: 3  Interim History: [name] continues to do well with weight loss. She is making strategies to avoid minimizing holiday weight gain.  Subjective:   1. Vitamin D deficiency [name] is stable on Vit D, and she is due to have labs.  2. Other hyperlipidemia [name] is working on diet and exercise, and she is due for labs. She denies chest pain.  3. Insulin resistance [name] is on metformin and she is working on decreasing simple carbohydrates. She denies nausea or vomiting.  4. Hypothyroidism, unspecified type [name] is on Synthroid, and she is due for labs. She denies signs of over-replacement.  5. Galactorrhea [name] has had some inappropriate lactation. She has an appointment next month with Dr. Cruzita Lederer.  6. Other depression with emotional eating [name] is stable on Wellbutrin, and she is doing well with decreasing emotional eating behaviors. She feels she is doing better with healthy food attitudes.   7. At risk for heart disease Sudie is at a higher than average risk for cardiovascular disease due to obesity.   Assessment/Plan:   1. Vitamin D deficiency Low Vitamin D level contributes to fatigue and are associated with obesity, breast, and colon cancer. We will refill prescription Vitamin D 50,000 IU every week for 1 month. We will check labs today, and Camilah will follow-up  for routine testing of Vitamin D, at least 2-3 times per year to avoid over-replacement.  - Vitamin D, Ergocalciferol, (DRISDOL) 1.25 MG (50000 UNIT) CAPS capsule; Take 1 capsule (50,000 Units total) by mouth every 7 (seven) days.  Dispense: 4 capsule; Refill: 0 - VITAMIN D 25 Hydroxy (Vit-D Deficiency, Fractures)  2. Other hyperlipidemia Cardiovascular risk and specific lipid/LDL goals reviewed. We discussed several lifestyle modifications today. We will check labs today. Baylen will continue to work on diet, exercise and weight loss efforts. Orders and follow up as documented in patient record.   - Lipid Panel With LDL/HDL Ratio  3. Insulin resistance Tzirel will continue to work on weight loss, exercise, and decreasing simple carbohydrates to help decrease the risk of diabetes. We will check labs today, and we will refill metformin for 1 month. [place] agreed to follow-up with [name] as directed to closely monitor her progress.  - metFORMIN (GLUCOPHAGE) 500 MG tablet; Take 1 tablet (500 mg total) by mouth daily with breakfast. Take one tab with breakfast  Dispense: 30 tablet; Refill: 0 - CMP14+EGFR - Insulin, random - Hemoglobin A1c  4. Hypothyroidism, unspecified type We will check labs today, and [place] will continue to follow up as directed. Orders and follow up as documented in patient record.  - TSH - T4, free - T3  5. Galactorrhea We will check prolactin, and thyroid panel today. Keiara will continue to follow up as directed. - Prolactin  6. Other depression with emotional eating Behavior modification techniques were discussed today to help [name] deal with  her emotional/non-hunger eating behaviors. We will refill Wellbutrin SR for 1 month. Orders and follow up as documented in patient record.   - buPROPion (WELLBUTRIN SR) 150 MG 12 hr tablet; Take 1 tablet (150 mg total) by mouth every morning.  Dispense: 30 tablet; Refill: 0  7. At risk for heart disease [name] was given  approximately 15 minutes of coronary artery disease prevention counseling today. She is 30 y.o. female and has risk factors for heart disease including obesity. We discussed intensive lifestyle modifications today with an emphasis on specific weight loss instructions and strategies.   Repetitive spaced learning was employed today to elicit superior memory formation and behavioral change.  8. Obesity with current BMI of 29.6 [name] is currently in the action stage of change. As such, her goal is to continue with weight loss efforts. She has agreed to keeping a food journal and adhering to recommended goals of 1200 calories and 80+ grams of protein daily.   Exercise goals: As is.  Behavioral modification strategies: increasing lean protein intake and holiday eating strategies .  [name] has agreed to follow-up with our clinic in 2 to 3 weeks. She was informed of the importance of frequent follow-up visits to maximize her success with intensive lifestyle modifications for her multiple health conditions.   [name] was informed we would discuss her lab results at her next visit unless there is a critical issue that needs to be addressed sooner. [name] agreed to keep her next visit at the agreed upon time to discuss these results.  Objective:   Blood pressure 120/77, pulse 73, temperature 98.1 F (36.7 C), height _0  (1.651 m), weight 177 lb (80.3 kg), SpO2 99 %. Body mass index is 29.45 kg/m.  General: Cooperative, alert, well developed, in no acute distress. HEENT: Conjunctivae and lids unremarkable. Cardiovascular: Regular rhythm.  Lungs: Normal work of breathing. Neurologic: No focal deficits.   Lab Results  Component Value Date   CREATININE 0.96 06/21/2021   BUN 14 06/21/2021   NA 141 06/21/2021   K 4.7 06/21/2021   CL 103 06/21/2021   CO2 23 06/21/2021   Lab Results  Component Value Date   ALT 26 06/21/2021   AST 20 06/21/2021   ALKPHOS 77 06/21/2021   BILITOT 0.3 06/21/2021    Lab Results  Component Value Date   HGBA1C 5.3 06/21/2021   HGBA1C 5.0 02/21/2021   HGBA1C 5.0 05/31/2020   Lab Results  Component Value Date   INSULIN 10.6 06/21/2021   INSULIN 14.9 02/21/2021   INSULIN 17.8 10/24/2020   INSULIN 13.6 05/31/2020   Lab Results  Component Value Date   TSH 1.68 10/12/2020   Lab Results  Component Value Date   CHOL 203 (H) 06/21/2021   HDL 62 06/21/2021   LDLCALC 126 (H) 06/21/2021   TRIG 86 06/21/2021   CHOLHDL 3.3 06/21/2021   Lab Results  Component Value Date   VD25OH 44.3 06/21/2021   VD25OH 76.1 05/31/2020   VD25OH 21.23 (L) 04/18/2016   Lab Results  Component Value Date   WBC 6.9 05/31/2020   HGB 14.5 05/31/2020   HCT 43.5 05/31/2020   MCV 91 05/31/2020   PLT 322 05/31/2020   Lab Results  Component Value Date   IRON 57 02/24/2019   FERRITIN 30.7 02/24/2019   Attestation Statements:   Reviewed by clinician on day of visit: allergies, medications, problem list, medical history, surgical history, family history, social history, and previous encounter notes.   Wilhemena Durie,  am acting as transcriptionist for Dennard Nip, MD.  I have reviewed the above documentation for accuracy and completeness, and I agree with the above. -  Dennard Nip, MD

## 2021-11-17 LAB — CMP14+EGFR
ALT: 30 IU/L (ref 0–32)
AST: 27 IU/L (ref 0–40)
Albumin/Globulin Ratio: 2.1 (ref 1.2–2.2)
Albumin: 4.6 g/dL (ref 3.8–4.8)
Alkaline Phosphatase: 76 IU/L (ref 44–121)
BUN/Creatinine Ratio: 18 (ref 9–23)
BUN: 16 mg/dL (ref 6–20)
Bilirubin Total: 0.3 mg/dL (ref 0.0–1.2)
CO2: 25 mmol/L (ref 20–29)
Calcium: 9.4 mg/dL (ref 8.7–10.2)
Chloride: 101 mmol/L (ref 96–106)
Creatinine, Ser: 0.87 mg/dL (ref 0.57–1.00)
Globulin, Total: 2.2 g/dL (ref 1.5–4.5)
Glucose: 80 mg/dL (ref 70–99)
Potassium: 4.3 mmol/L (ref 3.5–5.2)
Sodium: 139 mmol/L (ref 134–144)
Total Protein: 6.8 g/dL (ref 6.0–8.5)
eGFR: 91 mL/min/{1.73_m2} (ref >59)

## 2021-11-17 LAB — HEMOGLOBIN A1C
Est. average glucose Bld gHb Est-mCnc: 97 mg/dL
Hgb A1c MFr Bld: 5 % (ref 4.8–5.6)

## 2021-11-17 LAB — TSH: TSH: 1.66 u[IU]/mL (ref 0.450–4.500)

## 2021-11-17 LAB — INSULIN, RANDOM: INSULIN: 11.2 u[IU]/mL (ref 2.6–24.9)

## 2021-11-17 LAB — LIPID PANEL WITH LDL/HDL RATIO
Cholesterol, Total: 214 mg/dL — ABNORMAL HIGH (ref 100–199)
HDL: 60 mg/dL (ref >39)
LDL Chol Calc (NIH): 144 mg/dL — ABNORMAL HIGH (ref 0–99)
LDL/HDL Ratio: 2.4 ratio (ref 0.0–3.2)
Triglycerides: 59 mg/dL (ref 0–149)
VLDL Cholesterol Cal: 10 mg/dL (ref 5–40)

## 2021-11-17 LAB — T3: T3, Total: 138 ng/dL (ref 71–180)

## 2021-11-17 LAB — VITAMIN D 25 HYDROXY (VIT D DEFICIENCY, FRACTURES): Vit D, 25-Hydroxy: 103 ng/mL — ABNORMAL HIGH (ref 30.0–100.0)

## 2021-11-17 LAB — T4, FREE: Free T4: 1.27 ng/dL (ref 0.82–1.77)

## 2021-11-27 ENCOUNTER — Encounter (INDEPENDENT_AMBULATORY_CARE_PROVIDER_SITE_OTHER): Payer: Self-pay | Admitting: Family Medicine

## 2021-12-04 ENCOUNTER — Other Ambulatory Visit (INDEPENDENT_AMBULATORY_CARE_PROVIDER_SITE_OTHER): Payer: Self-pay | Admitting: Family Medicine

## 2021-12-04 DIAGNOSIS — F3289 Other specified depressive episodes: Secondary | ICD-10-CM

## 2021-12-04 MED ORDER — BUPROPION HCL ER (SR) 150 MG PO TB12: 150.0000 mg | ORAL_TABLET | Freq: Every morning | ORAL | 0 refills | Status: DC

## 2021-12-04 NOTE — Telephone Encounter (Signed)
Pt last seen by Dr. Beasley.  

## 2021-12-04 NOTE — Telephone Encounter (Signed)
LAST APPOINTMENT DATE: 11/16/21 NEXT APPOINTMENT DATE: 01/08/21   [id number] - [place], [place] - [address] [place] [address] Phone: [phone] Fax: [phone]  Patient is requesting a refill of the following medications: Requested Prescriptions   Pending Prescriptions Disp Refills   buPROPion (WELLBUTRIN SR) 150 MG 12 hr tablet 30 tablet 0    Sig: Take 1 tablet (150 mg total) by mouth every morning.    Date last filled: 11/16/21 Previously prescribed by Dr. Dalbert Garnet  Lab Results  Component Value Date   HGBA1C 5.0 11/16/2021   HGBA1C 5.3 06/21/2021   HGBA1C 5.0 02/21/2021   Lab Results  Component Value Date   LDLCALC 144 (H) 11/16/2021   CREATININE 0.87 11/16/2021   Lab Results  Component Value Date   VD25OH 103.0 (H) 11/16/2021   VD25OH 44.3 06/21/2021   VD25OH 76.1 05/31/2020    BP Readings from Last 3 Encounters:  11/16/21 120/77  11/02/21 129/80  10/17/21 121/75  .

## 2021-12-05 ENCOUNTER — Ambulatory Visit (INDEPENDENT_AMBULATORY_CARE_PROVIDER_SITE_OTHER): Payer: 59 | Admitting: Family Medicine

## 2021-12-07 ENCOUNTER — Encounter: Payer: Self-pay | Admitting: Internal Medicine

## 2021-12-07 ENCOUNTER — Ambulatory Visit: Payer: 59 | Admitting: Internal Medicine

## 2021-12-07 ENCOUNTER — Other Ambulatory Visit: Payer: Self-pay

## 2021-12-07 VITALS — BP 130/78 | HR 72 | Ht 65.0 in | Wt 183.4 lb

## 2021-12-07 DIAGNOSIS — E039 Hypothyroidism, unspecified: Secondary | ICD-10-CM | POA: Diagnosis not present

## 2021-12-07 DIAGNOSIS — N643 Galactorrhea not associated with childbirth: Secondary | ICD-10-CM

## 2021-12-07 DIAGNOSIS — E041 Nontoxic single thyroid nodule: Secondary | ICD-10-CM

## 2021-12-07 LAB — T3, FREE: T3, Free: 3.5 pg/mL (ref 2.3–4.2)

## 2021-12-07 LAB — T4, FREE: Free T4: 0.87 ng/dL (ref 0.60–1.60)

## 2021-12-07 LAB — TSH: TSH: 1.84 u[IU]/mL (ref 0.35–5.50)

## 2021-12-07 NOTE — Progress Notes (Addendum)
Patient ID: [name], female   DOB: February 18, 1991, 30 y.o.   MRN: [id number]  This visit occurred during the SARS-CoV-2 public health emergency.  Safety protocols were in place, including screening questions prior to the visit, additional usage of staff PPE, and extensive cleaning of exam room while observing appropriate contact time as indicated for disinfecting solutions.   HPI  [name] is a 30 y.o.-year-old female, referred by her PCP, Dr. Selena Batten, for management of hypothyroidism and galactorrhea.  She previously saw Dr. Lucianne Muss in our practice, wanting to switch to see a female physician.  Last visit with him was in 09/2020.  Patient mentions that she had a probable miscarriage in 2017, after which she was diagnosed with ovarian cysts and also with hypothyroidism.  Hypothyroidism (autoimmune per review of Dr. Remus Blake notes) >> currently on Levothyroxine 50 mcg.  She takes the thyroid hormone: - fasting - with water - separated by >30 min from b'fast  - no calcium, iron, PPIs, multivitamins   I reviewed pt's thyroid tests: Lab Results  Component Value Date   TSH 1.660 11/16/2021   TSH 1.68 10/12/2020   TSH 1.710 05/31/2020   TSH 2.27 02/26/2020   TSH 3.29 10/12/2019   TSH 1.35 02/24/2019   TSH 1.57 04/22/2018   TSH 2.64 04/23/2017   TSH 4.05 02/12/2017   TSH 6.330 (H) 11/21/2016   FREET4 1.27 11/16/2021   FREET4 0.74 10/12/2020   FREET4 1.13 05/31/2020   FREET4 0.82 02/26/2020   FREET4 0.70 10/12/2019   FREET4 0.83 02/24/2019   FREET4 0.75 04/22/2018   FREET4 0.61 04/23/2017   FREET4 0.70 02/12/2017   FREET4 0.87 11/21/2016   T3FREE 3.4 10/12/2019   T3FREE 3.4 11/21/2016   Antithyroid antibodies: No results found for: THGAB No components found for: TPOAB  Pt describes: - fatigue - especially after starting a second job - hot flushes  No: - cold intolerance - hair loss - GI symptoms  Pt denies feeling nodules in neck, hoarseness, dysphagia/odynophagia, SOB  with lying down.  She has + FH of thyroid disorders in: father, PGM. No FH of thyroid cancer.  No h/o radiation tx to head or neck. No recent use of iodine supplements.  She describes that she was found to have ovarian cysts in 2017 after a probable spontaneous miscarriage.  Transvaginal ultrasound (12/28/2016): Both ovaries are enlarged with multiple cysts measuring 2-3 cm, separated by thin septations. Differential diagnosis includes polycystic ovarian syndrome, theca lutein cysts, ovarian hyperstimulation, or less likely ovarian mucinous neoplasms. Consider correlation with serum hormone levels and tumor markers, and followup by ultrasound in 8-12 weeks.  She had a follow-up uterine ultrasound (10/16/2017)-Dr. Yalcinkaya's office: Anteverted uterus visualized and appearing within normal limits.  Total AFC was 20 with 3 larger follicles in the right ovary and 2 on the left ovary.  Transvaginal ultrasound (04/29/2019): Unremarkable uterus and LEFT ovary.   Complicated cystic lesion within RIGHT ovary 3.7 x 2.9 x 3.0 cm in size containing septations, 1 of which demonstrates small amount of internal blood flow.   Finding like represents an ovarian neoplasm though this could be benign or malignant.  Laparoscopic exploratory surgery (07/08/2019): Cysts were resected.  No endometriosis.   She has 1 daughter (she was 30) and 1 step son.  Had 2 surgical abortions at 30 and 30 y/o.   She is on OCPs for dysmenorrhea, headaches, and severe PMS symptoms-taking them continuously.  Previous testosterone, estrogen, progesterone levels reviewed per records from Dr. April Manson  from 10/16/2017 and these were normal  Patient also noticed mild galactorrhea -after starting Wellbutrin for weight loss.  No prolactin levels available for review. No results found for: PROLACTIN  MRI of brain and pituitary gland (01/03/2017): No brain or pituitary abnormality.  Is not on Risperdal, Reglan, does not  smoke marijuana.  No excessive exercise-does strength exercises 4 times a week.  She has insulin resistance - on metformin per Dr. Leafy Ro (Weight management clinic). She recently had a high vitamin D level so her vitamin D supplement is held for now. She had COVID-19 in 06/2021.  She recovered well. She does have a history of microscopic colitis seen on colonoscopy.  ROS: Constitutional: no weight gain/loss, + fatigue, + heat intolerance, + excessive urination Eyes: no blurry vision, no xerophthalmia ENT: no sore throat, no nodules palpated in throat, no dysphagia/odynophagia, no hoarseness, + decreased hearing Cardiovascular: no CP/SOB/palpitations/leg swelling Respiratory: no cough/SOB Gastrointestinal: + N/no V/D/C Musculoskeletal: no muscle/joint aches Skin: no rashes Neurological: no tremors/numbness/tingling/dizziness Psychiatric: no depression/anxiety  Past Medical History:  Diagnosis Date   Fatty liver    Headache    Migraines   History of chicken pox    Hypothyroidism    IBS (irritable bowel syndrome)    Microscopic colitis    PCOS (polycystic ovarian syndrome)    Urinary tract infection    Vertigo    Past Surgical History:  Procedure Laterality Date   LAPAROSCOPIC OVARIAN CYSTECTOMY Bilateral 07/08/2019   Procedure: LAPAROSCOPIC OVARIAN CYSTECTOMY with peritoneal biopsies;  Surgeon: Emily Filbert, MD;  Location: [place] [facility];  Service: Gynecology;  Laterality: Bilateral;   TONSILLECTOMY     adenoidectomy   WISDOM TOOTH EXTRACTION     Social History   Socioeconomic History   Marital status: Married    Spouse name: Braylea Alterman   Number of children: 1   Years of education: Not on file   Highest education level: Not on file  Occupational History   Occupation: Starting non profit  Tobacco Use   Smoking status: Never   Smokeless tobacco: Never  Vaping Use   Vaping Use: Never used  Substance and Sexual Activity   Alcohol use: Yes    Alcohol/week:  0.0 standard drinks    Comment: a few times a year   Drug use: No   Sexual activity: Yes    Birth control/protection: Pill  Other Topics Concern   Not on file  Social History Narrative   03/01/21   From: the area   Living: with husband, [name] Bihari and daughter   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 - [employer]      Family: daughter - Primitivo Gauze (2014)      Enjoys: exercise, spend time with family      Exercise: strength training - mix of everything - 5 days a week, 1 hour   Diet: low calorie, healthy diet and high protein diet      Safety   Seat belts: Yes    Guns: No   Safe in relationships: Yes    Social Determinants of Radio broadcast assistant Strain: Not on file  Food Insecurity: Not on file  Transportation Needs: Not on file  Physical Activity: Not on file  Stress: Not on file  Social Connections: Not on file  Intimate Partner Violence: Not on file   Current Outpatient Medications on File Prior to Visit  Medication Sig Dispense Refill   buPROPion (WELLBUTRIN SR) 150 MG 12 hr tablet Take 1  tablet (150 mg total) by mouth every morning. 30 tablet 0   Cholecalciferol (VITAMIN D3) 25 MCG (1000 UT) CAPS One cap daily (OTC) 60 capsule 0   levothyroxine (SYNTHROID) 50 MCG tablet TAKE 1 TABLET BY MOUTH EVERY DAY 90 tablet 0   metFORMIN (GLUCOPHAGE) 500 MG tablet Take 1 tablet (500 mg total) by mouth daily with breakfast. Take one tab with breakfast 30 tablet 0   SRONYX 0.1-20 MG-MCG tablet TAKE 1 TABLET BY MOUTH EVERY DAY (SKIP PLACEBOS) 112 tablet 1   valACYclovir (VALTREX) 1000 MG tablet Take 1 tablet (1,000 mg total) by mouth 2 (two) times daily. 6 tablet 2   Vitamin D, Ergocalciferol, (DRISDOL) 1.25 MG (50000 UNIT) CAPS capsule Take 1 capsule (50,000 Units total) by mouth every 7 (seven) days. 4 capsule 0   No current facility-administered medications on file prior to visit.   Allergies  Allergen Reactions   Erythromycin    Penicillins Hives    Can tolerate  cephalosporins   Family History  Problem Relation Age of Onset   Diabetes Maternal Grandmother    Breast cancer Maternal Grandmother 68   Parkinson's disease Maternal Grandmother    Thyroid disease Father    Sleep apnea Mother    Obesity Mother    Kidney disease Paternal Grandmother    Thyroid disease Paternal Grandmother    Healthy Brother    Healthy Daughter    Anesthesia problems Neg Hx     PE: BP 130/78 (BP Location: Right Arm, Patient Position: Sitting, Cuff Size: Normal)   Pulse 72   Ht 5\' 5"  (1.651 m)   Wt 183 lb 6.4 oz (83.2 kg)   SpO2 98%   BMI 30.52 kg/m  Wt Readings from Last 3 Encounters:  12/07/21 183 lb 6.4 oz (83.2 kg)  11/16/21 177 lb (80.3 kg)  11/02/21 180 lb (81.6 kg)   Constitutional: overweight, in NAD Eyes: PERRLA, EOMI, no exophthalmos ENT: moist mucous membranes, no thyromegaly, no cervical lymphadenopathy Cardiovascular: RRR, No MRG Respiratory: CTA B Gastrointestinal: abdomen soft, NT, ND, BS+ Musculoskeletal: no deformities, strength intact in all 4 Skin: moist, warm, no rashes Neurological: no tremor with outstretched hands, DTR normal in all 4  ASSESSMENT: 1. Hypothyroidism  2. Galactorrhea  3. Ovarian cysts  PLAN:  1. Patient with long-standing hypothyroidism, on low-dose levothyroxine therapy with 50 mcg levothyroxine daily. - she appears euthyroid.  - she does not appear to have a goiter, thyroid nodules, or neck compression symptoms - We discussed about correct intake of levothyroxine, fasting, with water, separated by at least 30 minutes from breakfast, and separated by more than 4 hours from calcium, iron, multivitamins, acid reflux medications (PPIs). - we reviewed her most recent TSH which was excellent, at 1.66 on 11/16/2021 - at today's visit, will repeat TFTs and also TPO and ATA antibody as she does have a dx of autoimmune hypothyroidism per review of Dr. Ronnie Derby notes but I do not see Abs titer - We discussed that she  appears to be on an adequate levothyroxine dose  2. Galactorrhea -Reviewed results of the pituitary MRI from 2018 and this did not show any tumor -She has stimulated bilateral galactorrhea (with mild stimulation) after started Wellbutrin - she is still on this - for help with weight loss -She is not on Risperdal, Reglan, but she is on oral contraceptives. -We will check a prolactin level today - I discussed with the patient about possible etiologies of high prolactin levels: Pituitary microadenoma (negative pituitary MRI)  Pregnancy OCPs (she is on these) Hypothyroidism (recent TFTs were normal) Stress  Exercise  Lack of sleep  Medications (patient is not taking psychotropic medications or Reglan) Drugs (she is on Buproprion, which can have galactorrhea as side effect) Chest wall lesions (denies) Seizures (denies) Liver ds (no history of ~) Kidney disease (no history of ~) A teratoma containing pituitary cells that can develop into prolactinoma (very rarely) Macroprolactin (multimeric prolactin, especially in patients without galactorrhea) idiopathic - We discussed about possible treatment if prolactin is elevated. She will likely need to stop Wellbutrin. If Prolactin remains high, we have 2 options for treatment: Cabergoline and bromocriptine.  Cabergoline has the advantage of being administered usually 1-2 times a week, is better tolerated, and has beneficial effects on pituitary tumor size.  Bromocriptine is taken several times a day, has more side effects, and has no effect on pituitary tumor size.  She agrees with the plan to start cabergoline if still needed. - I will see her back in 6 months.  3. Ovarian cysts -Luteal cysts per review of pathology report from her laparoscopic site. -no hirsutism and acne; -she does have dysmenorrhea and PMS if she does not take OCPs -testosterone normal per review of Dr. Charlett Lango labs from 2018. At that time, LH and [facility] were also normal, as  were her TFTs -Suspicion for PCOS is not high -As of now, no desire for pregnancy -she will continue on OCPs, which she tolerates well.  - Total time spent for the visit: 50 min, in obtaining medical information from the pt. and from the chart, reviewing her  previous labs, imaging evaluations, and treatments, reviewing her symptoms, counseling her about her conditions (please see the discussed topics above), and developing a plan to further investigate and treat them; she had a number of questions which I addressed.  Component     Latest Ref Rng & Units 12/07/2021  TSH     0.35 - 5.50 uIU/mL 1.84  T4,Free(Direct)     0.60 - 1.60 ng/dL 0.87  Triiodothyronine,Free,Serum     2.3 - 4.2 pg/mL 3.5  Thyroglobulin Ab     < or = 1 IU/mL 5 (H)  Thyroperoxidase Ab SerPl-aCnc     <9 IU/mL 210 (H)  Prolactin     ng/mL 7.1   TFTs are normal.  TPO and ATA antibodies are elevated, confirming Hashimoto's thyroiditis. Prolactin level is not elevated.  I will asked the lab to try to dilute the sample to eliminate a possible hook effect.  This would be quite unusual, though, but not impossible.  Component Ref Range & Units 4 d ago   PROLACTIN,UNDILUTED 2.0 - 30.0 ng/mL 7.1   PROLACTIN,DILUTED ng/mL   Comment: Result confirmed by 1:100 dilution. No high dose  hook effect detected.              Reference Range   Females          Non-pregnant        3.0-30.0          Pregnant           10.0-209.0          Postmenopausal      2.0-20.0  .   No hook effect.  No further investigation for high prolactin needed for now.  I would still suggest to have a mammogram checked and also to try to stop Wellbutrin if possible.  Thyroid U/S (12/21/2021): Parenchymal Echotexture: Mildly heterogenous Isthmus: 0.4 cm Right lobe: 5.2  x 1.4 x 2.0 cm Left lobe: 5.6 x 1.0 x 1.4 cm _________________________________________________________  Nodule # 1: Location: Isthmus; Inferior Maximum size: 1.5 cm; Other 2  dimensions: 1.2 x 0.9 cm Composition: solid/almost completely solid (2) Echogenicity: isoechoic (1) *Given size (>/= 1.5 - 2.4 cm) and appearance, a follow-up ultrasound in 1 year should be considered based on TI-RADS criteria. _________________________________________________________  Also observed are a few additional scattered subcentimeter isoechoic solid nodules with indiscrete borders, likely representing pseudo nodules. Just posterior to the thyroid capsule in the left upper, right lower, and left lower thyroid are subcentimeter heterogeneously hypodense ovoid structures most compatible with parathyroid glands. No appreciable cervical lymphadenopathy.  IMPRESSION: Solitary isthmus solid thyroid nodule (labeled 1, 1.5 cm) meets criteria (TI-RADS category 3) for 1 year ultrasound surveillance. This finding likely corresponds to the palpable area of concern.      Philemon Kingdom, MD PhD [facility]

## 2021-12-07 NOTE — Patient Instructions (Addendum)
Please stop at the lab.  Please continue Levothyroxine 50 mcg daily.  Take the thyroid hormone every day, with water, at least 30 minutes before breakfast, separated by at least 4 hours from: - acid reflux medications - calcium - iron - multivitamins  Please return in 6 months.

## 2021-12-08 ENCOUNTER — Other Ambulatory Visit: Payer: Self-pay

## 2021-12-09 LAB — THYROID PEROXIDASE ANTIBODY: Thyroperoxidase Ab SerPl-aCnc: 210 IU/mL — ABNORMAL HIGH (ref <9)

## 2021-12-09 LAB — PROLACTIN: Prolactin: 7.1 ng/mL

## 2021-12-09 LAB — TEST AUTHORIZATION

## 2021-12-09 LAB — PROLACTIN W/DILUTION: PROLACTIN,UNDILUTED: 7.1 ng/mL (ref 2.0–30.0)

## 2021-12-09 LAB — THYROGLOBULIN ANTIBODY: Thyroglobulin Ab: 5 IU/mL — ABNORMAL HIGH (ref <=1)

## 2021-12-21 ENCOUNTER — Ambulatory Visit
Admission: RE | Admit: 2021-12-21 | Discharge: 2021-12-21 | Disposition: A | Discharge status: Home / Self Care | Payer: 59 | Source: Ambulatory Visit | Attending: Internal Medicine | Admitting: Internal Medicine

## 2021-12-21 DIAGNOSIS — E041 Nontoxic single thyroid nodule: Secondary | ICD-10-CM

## 2021-12-26 ENCOUNTER — Encounter: Payer: Self-pay | Admitting: Internal Medicine

## 2022-01-08 ENCOUNTER — Other Ambulatory Visit: Payer: Self-pay

## 2022-01-08 ENCOUNTER — Encounter (INDEPENDENT_AMBULATORY_CARE_PROVIDER_SITE_OTHER): Payer: Self-pay | Admitting: Family Medicine

## 2022-01-08 ENCOUNTER — Ambulatory Visit (INDEPENDENT_AMBULATORY_CARE_PROVIDER_SITE_OTHER): Payer: 59 | Admitting: Family Medicine

## 2022-01-08 VITALS — BP 121/78 | HR 73 | Temp 97.8°F | Ht 65.0 in | Wt 176.0 lb

## 2022-01-08 DIAGNOSIS — Z6829 Body mass index (BMI) 29.0-29.9, adult: Secondary | ICD-10-CM | POA: Diagnosis not present

## 2022-01-08 DIAGNOSIS — E8881 Metabolic syndrome: Secondary | ICD-10-CM

## 2022-01-08 DIAGNOSIS — E66811 Obesity, class 1: Secondary | ICD-10-CM

## 2022-01-08 DIAGNOSIS — E669 Obesity, unspecified: Secondary | ICD-10-CM | POA: Diagnosis not present

## 2022-01-08 DIAGNOSIS — Z6833 Body mass index (BMI) 33.0-33.9, adult: Secondary | ICD-10-CM

## 2022-01-08 DIAGNOSIS — Z9189 Other specified personal risk factors, not elsewhere classified: Secondary | ICD-10-CM

## 2022-01-08 DIAGNOSIS — E88819 Insulin resistance, unspecified: Secondary | ICD-10-CM

## 2022-01-08 DIAGNOSIS — F3289 Other specified depressive episodes: Secondary | ICD-10-CM

## 2022-01-08 MED ORDER — METFORMIN HCL 500 MG PO TABS: 500.0000 mg | ORAL_TABLET | Freq: Every day | ORAL | 0 refills | Status: DC

## 2022-01-09 NOTE — Progress Notes (Signed)
Chief Complaint:   OBESITY [name] is here to discuss her progress with her obesity treatment plan along with follow-up of her obesity related diagnoses. [name] is on keeping a food journal and adhering to recommended goals of 1200 calories and 80+ grams of protein daily and states she is following her eating plan approximately 25% of the time. [name] states she is doing cardio and weight training for 60 minutes 3 times per week.  Today's visit was #: 28 Starting weight: 199 lbs Starting date: 05/31/2020 Today's weight: 176 lbs Today's date: 01/08/2022 Total lbs lost to date: 23 Total lbs lost since last in-office visit: 1  Interim History: [name] has done well with maintaining her weight over the holidays. She noted a lot of temptations over Christmas, but she feels this has improved. She now works at her gym and she is trying to get into her new routine.  Subjective:   1. Insulin resistance [name] has been taking metformin intermittently. She is working on getting back into the routine of taking it regularly with food.  2. Other depression with emotional eating [name] had side effects with her Wellbutrin, and she had to stop. She continues to be mindful of emotional eating behaviors.  3. At risk for diarrhea [name] is at higher risk of diarrhea if simple carbohydrates increase.  Assessment/Plan:   1. Insulin resistance [name] will continue to work on weight loss, exercise, and decreasing simple carbohydrates to help decrease the risk of diabetes. We will refill metformin for 90 days with no refills. [name] agreed to follow-up with [name] as directed to closely monitor her progress.  - metFORMIN (GLUCOPHAGE) 500 MG tablet; Take 1 tablet (500 mg total) by mouth daily with breakfast. Take one tab with breakfast  Dispense: 90 tablet; Refill: 0  2. Other depression with emotional eating [name] agreed to discontinue Wellbutrin and we will follow up at her next visit. Behavior modification  techniques were discussed today to help [name] deal with her emotional/non-hunger eating behaviors. Orders and follow up as documented in patient record.   3. At risk for diarrhea [name] was given approximately 15 minutes of diarrhea prevention counseling today. She is 31 y.o. female and has risk factors for diarrhea including medications and changes in diet. We discussed intensive lifestyle modifications today with an emphasis on specific weight loss instructions including dietary strategies.   Repetitive spaced learning was employed today to elicit superior memory formation and behavioral change.  4. Obesity with current BMI of 29.4 [name] is currently in the action stage of change. As such, her goal is to continue with weight loss efforts. She has agreed to keeping a food journal and adhering to recommended goals of 1200 calories and 80+ grams of protein daily.   Exercise goals: As is.  Behavioral modification strategies: increasing lean protein intake and emotional eating strategies.  [name] has agreed to follow-up with our clinic in 2 to 3 weeks. She was informed of the importance of frequent follow-up visits to maximize her success with intensive lifestyle modifications for her multiple health conditions.   Objective:   Blood pressure 121/78, pulse 73, temperature 97.8 F (36.6 C), height 5\' 5"  (1.651 m), weight 176 lb (79.8 kg), SpO2 99 %. Body mass index is 29.29 kg/m.  General: Cooperative, alert, well developed, in no acute distress. HEENT: Conjunctivae and lids unremarkable. Cardiovascular: Regular rhythm.  Lungs: Normal work of breathing. Neurologic: No focal deficits.   Lab Results  Component Value Date   CREATININE  0.87 11/16/2021   BUN 16 11/16/2021   NA 139 11/16/2021   K 4.3 11/16/2021   CL 101 11/16/2021   CO2 25 11/16/2021   Lab Results  Component Value Date   ALT 30 11/16/2021   AST 27 11/16/2021   ALKPHOS 76 11/16/2021   BILITOT 0.3 11/16/2021   Lab  Results  Component Value Date   HGBA1C 5.0 11/16/2021   HGBA1C 5.3 06/21/2021   HGBA1C 5.0 02/21/2021   HGBA1C 5.0 05/31/2020   Lab Results  Component Value Date   INSULIN 11.2 11/16/2021   INSULIN 10.6 06/21/2021   INSULIN 14.9 02/21/2021   INSULIN 17.8 10/24/2020   INSULIN 13.6 05/31/2020   Lab Results  Component Value Date   TSH 1.84 12/07/2021   Lab Results  Component Value Date   CHOL 214 (H) 11/16/2021   HDL 60 11/16/2021   LDLCALC 144 (H) 11/16/2021   TRIG 59 11/16/2021   CHOLHDL 3.3 06/21/2021   Lab Results  Component Value Date   VD25OH 103.0 (H) 11/16/2021   VD25OH 44.3 06/21/2021   VD25OH 76.1 05/31/2020   Lab Results  Component Value Date   WBC 6.9 05/31/2020   HGB 14.5 05/31/2020   HCT 43.5 05/31/2020   MCV 91 05/31/2020   PLT 322 05/31/2020   Lab Results  Component Value Date   IRON 57 02/24/2019   FERRITIN 30.7 02/24/2019   Attestation Statements:   Reviewed by clinician on day of visit: allergies, medications, problem list, medical history, surgical history, family history, social history, and previous encounter notes.   I, Burt Knack, am acting as transcriptionist for Quillian Quince, MD.  I have reviewed the above documentation for accuracy and completeness, and I agree with the above. -  Quillian Quince, MD

## 2022-01-17 ENCOUNTER — Other Ambulatory Visit: Payer: Self-pay | Admitting: Endocrinology

## 2022-01-22 ENCOUNTER — Ambulatory Visit (INDEPENDENT_AMBULATORY_CARE_PROVIDER_SITE_OTHER): Payer: 59 | Admitting: Physician Assistant

## 2022-02-08 ENCOUNTER — Encounter: Payer: Self-pay | Admitting: Advanced Practice Midwife

## 2022-02-08 ENCOUNTER — Ambulatory Visit: Payer: 59 | Admitting: Advanced Practice Midwife

## 2022-02-08 ENCOUNTER — Other Ambulatory Visit: Payer: Self-pay

## 2022-02-08 VITALS — BP 129/84 | HR 71 | Ht 65.0 in | Wt 181.0 lb

## 2022-02-08 DIAGNOSIS — N6452 Nipple discharge: Secondary | ICD-10-CM

## 2022-02-08 NOTE — Progress Notes (Addendum)
GYNECOLOGY PROBLEM CARE ENCOUNTER NOTE  History:     [name] is a 31 y.o. G37P1001 female here for a routine annual gynecologic exam.  Current complaints: persistent right nipple discharge. Patient states she noticed bilateral clear nipple discharge about two weeks after she initiated Wellbutrin for weight loss. Denies abnormal vaginal bleeding, discharge, pelvic pain, problems with intercourse or other gynecologic concerns.    Gynecologic History No LMP recorded. (Menstrual status: Oral contraceptives). Contraception: OCP (estrogen/progesterone) Last Pap: 12/10/2019. Result was normal with negative HPV Last Mammogram: N/A.    Obstetric History OB History  Gravida Para Term Preterm AB Living  3 1 1  0 0 1  SAB IAB Ectopic Multiple Live Births  0 0 0 0 1    # Outcome Date GA Lbr Len/2nd Weight Sex Delivery Anes PTL Lv  3 Term 05/09/12 [redacted]w[redacted]d 10:02 / 00:57 6 lb 3.7 oz (2.825 kg) F Vag-Spont Gen  LIV  2 Gravida           1 [place]             Past Medical History:  Diagnosis Date   Fatty liver    Headache    Migraines   History of chicken pox    Hypothyroidism    IBS (irritable bowel syndrome)    Microscopic colitis    PCOS (polycystic ovarian syndrome)    Urinary tract infection    Vertigo     Past Surgical History:  Procedure Laterality Date   LAPAROSCOPIC OVARIAN CYSTECTOMY Bilateral 07/08/2019   Procedure: LAPAROSCOPIC OVARIAN CYSTECTOMY with peritoneal biopsies;  Surgeon: Emily Filbert, MD;  Location: [place];  Service: Gynecology;  Laterality: Bilateral;   TONSILLECTOMY     adenoidectomy   WISDOM TOOTH EXTRACTION      Current Outpatient Medications on File Prior to Visit  Medication Sig Dispense Refill   levothyroxine (SYNTHROID) 50 MCG tablet TAKE 1 TABLET BY MOUTH EVERY DAY 90 tablet 0   metFORMIN (GLUCOPHAGE) 500 MG tablet Take 1 tablet (500 mg total) by mouth daily with breakfast. Take one tab with breakfast 90 tablet 0   SRONYX 0.1-20  MG-MCG tablet TAKE 1 TABLET BY MOUTH EVERY DAY (SKIP PLACEBOS) 112 tablet 1   valACYclovir (VALTREX) 1000 MG tablet Take 1 tablet (1,000 mg total) by mouth 2 (two) times daily. 6 tablet 2   buPROPion (WELLBUTRIN SR) 150 MG 12 hr tablet Take 1 tablet (150 mg total) by mouth every morning. (Patient not taking: Reported on 01/08/2022) 30 tablet 0   No current facility-administered medications on file prior to visit.    Allergies  Allergen Reactions   Erythromycin    Penicillins Hives    Can tolerate cephalosporins    Social History:  reports that she has never smoked. She has never used smokeless tobacco. She reports current alcohol use. She reports that she does not use drugs.  Family History  Problem Relation Age of Onset   Diabetes Maternal Grandmother    Breast cancer Maternal Grandmother 42   Parkinson's disease Maternal Grandmother    Thyroid disease Father    Sleep apnea Mother    Obesity Mother    Kidney disease Paternal Grandmother    Thyroid disease Paternal Grandmother    Healthy Brother    Healthy Daughter    Anesthesia problems Neg Hx     The following portions of the patient's history were reviewed and updated as appropriate: allergies, current medications, past family history, past medical  history, past social history, past surgical history and problem list.  Review of Systems Pertinent items noted in HPI and remainder of comprehensive ROS otherwise negative.  Physical Exam:  BP 129/84    Pulse 71    Ht 5\' 5"  (1.651 m)    Wt 181 lb (82.1 kg)    BMI 30.12 kg/m  CONSTITUTIONAL: Well-developed, well-nourished female in no acute distress.  HENT:  Normocephalic, atraumatic, External right and left ear normal.  EYES: Conjunctivae and EOM are normal. Pupils are equal, round, and reactive to light. No scleral icterus.  NECK: Normal range of motion, supple, no masses.  Normal thyroid.  SKIN: Skin is warm and dry. No rash noted. Not diaphoretic. No erythema. No  pallor. MUSCULOSKELETAL: Normal range of motion. No tenderness.  No cyanosis, clubbing, or edema. NEUROLOGIC: Alert and oriented to person, place, and time. Normal reflexes, muscle tone coordination.  PSYCHIATRIC: Normal mood and affect. Normal behavior. Normal judgment and thought content. CARDIOVASCULAR: Normal heart rate noted, regular rhythm RESPIRATORY: Clear to auscultation bilaterally. Effort and breath sounds normal, no problems with respiration noted. BREASTS: Symmetric in size. No masses, tenderness, skin changes, or lymphadenopathy bilaterally. CNM unable to elicit right nipple discharge with gentle stimulation but patient performed and was able to produce thing clear discharge. Performed in the presence of a chaperone.    Assessment and Plan:    1. Nipple discharge - Prolactin already checked by PCP 12/07/2021, WNL - MM DIAG BREAST TOMO BILATERAL; Future - US BREAST LTD UNI LEFT INC AXILLA; Future - US BREAST LTD UNI RIGHT INC AXILLA; Future  Routine preventative health maintenance measures emphasized. Please refer to After Visit Summary for other counseling recommendations.      Mallie Snooks, [place], MSN, CNM Certified Nurse Midwife, Product/process development scientist for [employer], [place]

## 2022-02-12 ENCOUNTER — Other Ambulatory Visit: Payer: Self-pay

## 2022-02-12 ENCOUNTER — Ambulatory Visit (INDEPENDENT_AMBULATORY_CARE_PROVIDER_SITE_OTHER): Payer: 59 | Admitting: Family Medicine

## 2022-02-12 ENCOUNTER — Encounter (INDEPENDENT_AMBULATORY_CARE_PROVIDER_SITE_OTHER): Payer: Self-pay | Admitting: Family Medicine

## 2022-02-12 VITALS — BP 132/84 | HR 62 | Temp 97.8°F | Ht 65.0 in | Wt 179.0 lb

## 2022-02-12 DIAGNOSIS — E038 Other specified hypothyroidism: Secondary | ICD-10-CM | POA: Diagnosis not present

## 2022-02-12 DIAGNOSIS — E669 Obesity, unspecified: Secondary | ICD-10-CM | POA: Diagnosis not present

## 2022-02-12 DIAGNOSIS — Z6833 Body mass index (BMI) 33.0-33.9, adult: Secondary | ICD-10-CM

## 2022-02-12 DIAGNOSIS — N643 Galactorrhea not associated with childbirth: Secondary | ICD-10-CM

## 2022-02-12 DIAGNOSIS — Z6829 Body mass index (BMI) 29.0-29.9, adult: Secondary | ICD-10-CM

## 2022-02-12 DIAGNOSIS — Z9189 Other specified personal risk factors, not elsewhere classified: Secondary | ICD-10-CM

## 2022-02-12 DIAGNOSIS — E8881 Metabolic syndrome: Secondary | ICD-10-CM | POA: Diagnosis not present

## 2022-02-12 NOTE — Progress Notes (Signed)
Chief Complaint:   OBESITY [name] is here to discuss her progress with her obesity treatment plan along with follow-up of her obesity related diagnoses. [name] is on keeping a food journal and adhering to recommended goals of 1200 calories and 80+ grams of protein daily and states she is following her eating plan approximately 75% of the time. [name] states she is doing strength training for 45-60 minutes 4 times per week.  Today's visit was #: 19 Starting weight: 199 lbs Starting date: 05/31/2020 Today's weight: 179 lbs Today's date: 02/12/2022 Total lbs lost to date: 20 Total lbs lost since last in-office visit: 0  Interim History: [name] has talked with her husband recently about ways to help her get and stay on track. She is  wanting to take control and start focusing on food choices and planning.  Subjective:   1. Insulin resistance [name] is taking metformin 500 mg, but not on a regular basis. She denies side effects.   2. Other specified hypothyroidism [name] is taking Synthroid, and she denies side effects.  3. Galactorrhea [name] is scheduled for an ultrasound of the breasts in March.  4. At risk for impaired metabolic function [name] is at increased risk for impaired metabolic function due to current nutrition and muscle mass.  Assessment/Plan:   1. Insulin resistance [name] will continue metformin 500 mg PO daily, and take as directed. She will continue working on dietary changes, exercise, and weight loss to help decrease the risk of diabetes. [name] agreed to follow-up with [name] as directed to closely monitor her progress.  2. Other specified hypothyroidism [name] will continue to follow up with her primary care physician, and continue her medications as directed. Orders and follow up as documented in patient record.  3. Galactorrhea [name] will continue to follow up with her GYN.  4. At risk for impaired metabolic function [name] was given approximately 15 minutes of  impaired  metabolic function prevention counseling today. We discussed intensive lifestyle modifications today with an emphasis on specific nutrition and exercise instructions and strategies.   Repetitive spaced learning was employed today to elicit superior memory formation and behavioral change.  5. Obesity with current BMI of 29.8 [name] is currently in the action stage of change. As such, her goal is to continue with weight loss efforts. She has agreed to keeping a food journal and adhering to recommended goals of 1200 calories and 80+ grams of protein daily.   Family support handout was given to the patient.  Exercise goals: As is.  Behavioral modification strategies: increasing lean protein intake, increasing vegetables, increasing water intake, decreasing eating out, meal planning and cooking strategies, better snacking choices, and planning for success.  [name] has agreed to follow-up with our clinic in 3 weeks. She was informed of the importance of frequent follow-up visits to maximize her success with intensive lifestyle modifications for her multiple health conditions.   Objective:   Blood pressure 132/84, pulse 62, temperature 97.8 F (36.6 C), height 5\' 5"  (1.651 m), weight 179 lb (81.2 kg), SpO2 98 %. Body mass index is 29.79 kg/m.  General: Cooperative, alert, well developed, in no acute distress. HEENT: Conjunctivae and lids unremarkable. Cardiovascular: Regular rhythm.  Lungs: Normal work of breathing. Neurologic: No focal deficits.   Lab Results  Component Value Date   CREATININE 0.87 11/16/2021   BUN 16 11/16/2021   NA 139 11/16/2021   K 4.3 11/16/2021   CL 101 11/16/2021   CO2 25 11/16/2021  Lab Results  Component Value Date   ALT 30 11/16/2021   AST 27 11/16/2021   ALKPHOS 76 11/16/2021   BILITOT 0.3 11/16/2021   Lab Results  Component Value Date   HGBA1C 5.0 11/16/2021   HGBA1C 5.3 06/21/2021   HGBA1C 5.0 02/21/2021   HGBA1C 5.0 05/31/2020   Lab  Results  Component Value Date   INSULIN 11.2 11/16/2021   INSULIN 10.6 06/21/2021   INSULIN 14.9 02/21/2021   INSULIN 17.8 10/24/2020   INSULIN 13.6 05/31/2020   Lab Results  Component Value Date   TSH 1.84 12/07/2021   Lab Results  Component Value Date   CHOL 214 (H) 11/16/2021   HDL 60 11/16/2021   LDLCALC 144 (H) 11/16/2021   TRIG 59 11/16/2021   CHOLHDL 3.3 06/21/2021   Lab Results  Component Value Date   VD25OH 103.0 (H) 11/16/2021   VD25OH 44.3 06/21/2021   VD25OH 76.1 05/31/2020   Lab Results  Component Value Date   WBC 6.9 05/31/2020   HGB 14.5 05/31/2020   HCT 43.5 05/31/2020   MCV 91 05/31/2020   PLT 322 05/31/2020   Lab Results  Component Value Date   IRON 57 02/24/2019   FERRITIN 30.7 02/24/2019   Attestation Statements:   Reviewed by clinician on day of visit: allergies, medications, problem list, medical history, surgical history, family history, social history, and previous encounter notes.  Time spent on visit including pre-visit chart review and post-visit care and charting was 30 minutes.    I, Trixie Dredge, am acting as transcriptionist for Dennard Nip, MD.  I have reviewed the above documentation for accuracy and completeness, and I agree with the above. -  Dennard Nip, MD

## 2022-02-23 ENCOUNTER — Ambulatory Visit: Payer: 59 | Admitting: Family

## 2022-02-23 ENCOUNTER — Other Ambulatory Visit: Payer: Self-pay

## 2022-02-23 ENCOUNTER — Other Ambulatory Visit: Payer: Self-pay | Admitting: Obstetrics & Gynecology

## 2022-02-23 VITALS — BP 132/86 | HR 80 | Temp 98.2°F | Ht 65.0 in | Wt 182.0 lb

## 2022-02-23 DIAGNOSIS — M542 Cervicalgia: Secondary | ICD-10-CM | POA: Diagnosis not present

## 2022-02-23 DIAGNOSIS — M62838 Other muscle spasm: Secondary | ICD-10-CM | POA: Diagnosis not present

## 2022-02-23 MED ORDER — CYCLOBENZAPRINE HCL 10 MG PO TABS
10.0000 mg | ORAL_TABLET | Freq: Three times a day (TID) | ORAL | 0 refills | Status: DC | PRN
Start: 2022-02-23 — End: 2022-03-12

## 2022-02-23 MED ORDER — IBUPROFEN 600 MG PO TABS: 600.0000 mg | ORAL_TABLET | Freq: Three times a day (TID) | ORAL | 0 refills | Status: DC | PRN

## 2022-02-23 NOTE — Patient Instructions (Signed)
I sent in a muscle relaxer to take at night or when you are not expecting to do anything else as it will make you sleepy you can take 1/2 to one full tablet.  Also I sent ibuprofen to help for the inflammation I would suggest taking this for the next few days to help with improvement of inflammation.  If he does not have improvement over the weekend please let me know and we can consider an x-ray.  It was a pleasure seeing you today! Please do not hesitate to reach out with any questions and or concerns.  Regards,   Mort Sawyers FNP-C

## 2022-02-23 NOTE — Progress Notes (Signed)
Established Patient Office Visit  Subjective:  Patient ID: [name], female    DOB: 01-29-1991  Age: 31 y.o. MRN: [id number]  CC:  Chief Complaint  Patient presents with   Ear Pain    Left ear pain radiating neck/shoulder area---2 days    HPI [name] is here today with concerns.   Started 2 days ago with left ear pain that radiates down to the left posterior neck.  She does complain of left lower neck stiffness especially rotation to the left side.  The pain is constant and it feels like a tightening spasming type of pain.  She is not sure if it is harder to swallow or not because she is been recently diagnosed with a goiter.  She does notice that she has a sore throat but it is mainly just dry feeling.  There is no nasal congestion no sinus pressure no fever no chills also states no known trauma or injury to the neck.  Past Medical History:  Diagnosis Date   Fatty liver    Headache    Migraines   History of chicken pox    Hypothyroidism    IBS (irritable bowel syndrome)    Microscopic colitis    PCOS (polycystic ovarian syndrome)    Urinary tract infection    Vertigo     Past Surgical History:  Procedure Laterality Date   LAPAROSCOPIC OVARIAN CYSTECTOMY Bilateral 07/08/2019   Procedure: LAPAROSCOPIC OVARIAN CYSTECTOMY with peritoneal biopsies;  Surgeon: Emily Filbert, MD;  Location: [place];  Service: Gynecology;  Laterality: Bilateral;   TONSILLECTOMY     adenoidectomy   WISDOM TOOTH EXTRACTION      Family History  Problem Relation Age of Onset   Diabetes Maternal Grandmother    Breast cancer Maternal Grandmother 40   Parkinson's disease Maternal Grandmother    Thyroid disease Father    Sleep apnea Mother    Obesity Mother    Kidney disease Paternal Grandmother    Thyroid disease Paternal Grandmother    Healthy Brother    Healthy Daughter    Anesthesia problems Neg Hx     Social History   Socioeconomic History   Marital status:  Married    Spouse name: Kaleyah Labreck   Number of children: 1   Years of education: Not on file   Highest education level: Not on file  Occupational History   Occupation: Starting non profit  Tobacco Use   Smoking status: Never   Smokeless tobacco: Never  Vaping Use   Vaping Use: Never used  Substance and Sexual Activity   Alcohol use: Yes    Alcohol/week: 0.0 standard drinks    Comment: a few times a year   Drug use: No   Sexual activity: Yes    Birth control/protection: Pill  Other Topics Concern   Not on file  Social History Narrative   03/01/21   From: the area   Living: with husband, Lennette Bihari and daughter   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 - [employer]      Family: daughter - Primitivo Gauze (2014)      Enjoys: exercise, spend time with family      Exercise: strength training - mix of everything - 5 days a week, 1 hour   Diet: low calorie, healthy diet and high protein diet      Safety   Seat belts: Yes    Guns: No   Safe in relationships: Yes  Social Determinants of Health   Financial Resource Strain: Not on file  Food Insecurity: Not on file  Transportation Needs: Not on file  Physical Activity: Not on file  Stress: Not on file  Social Connections: Not on file  Intimate Partner Violence: Not on file    Outpatient Medications Prior to Visit  Medication Sig Dispense Refill   levothyroxine (SYNTHROID) 50 MCG tablet TAKE 1 TABLET BY MOUTH EVERY DAY 90 tablet 0   metFORMIN (GLUCOPHAGE) 500 MG tablet Take 1 tablet (500 mg total) by mouth daily with breakfast. Take one tab with breakfast 90 tablet 0   SRONYX 0.1-20 MG-MCG tablet TAKE 1 TABLET BY MOUTH EVERY DAY (SKIP PLACEBOS) 112 tablet 1   valACYclovir (VALTREX) 1000 MG tablet Take 1 tablet (1,000 mg total) by mouth 2 (two) times daily. (Patient not taking: Reported on 02/23/2022) 6 tablet 2   No facility-administered medications prior to visit.    Allergies  Allergen Reactions   Erythromycin     Penicillins Hives    Can tolerate cephalosporins    ROS Review of Systems  Constitutional:  Negative for chills and fever.  HENT:  Positive for ear pain (superior pain below ear). Negative for congestion, sinus pressure and sore throat.   Respiratory:  Negative for cough, shortness of breath and wheezing.   Cardiovascular:  Negative for chest pain and palpitations.  Musculoskeletal:  Positive for neck pain. Neck stiffness: with rotation and hyperextenion/flexion.    Objective:    Physical Exam Constitutional:      General: She is not in acute distress.    Appearance: Normal appearance. She is normal weight. She is not ill-appearing, toxic-appearing or diaphoretic.  HENT:     Head: Normocephalic.     Right Ear: Tympanic membrane normal.     Left Ear: Tympanic membrane normal.     Nose: Nose normal.     Mouth/Throat:     Mouth: Mucous membranes are moist.  Eyes:     Pupils: Pupils are equal, round, and reactive to light.  Neck:     Vascular: No carotid bruit (with rom with hyperext flexion as well as left rotation).   Cardiovascular:     Rate and Rhythm: Normal rate and regular rhythm.  Pulmonary:     Effort: Pulmonary effort is normal.  Musculoskeletal:     Cervical back: Neck supple. Tenderness present. No edema, erythema or rigidity. Pain with movement and muscular tenderness present.  Neurological:     Mental Status: She is alert.    BP 132/86    Pulse 80    Temp 98.2 F (36.8 C)    Ht 5' 5"  (1.651 m)    Wt 182 lb (82.6 kg)    SpO2 99%    BMI 30.29 kg/m  Wt Readings from Last 3 Encounters:  02/23/22 182 lb (82.6 kg)  02/12/22 179 lb (81.2 kg)  02/08/22 181 lb (82.1 kg)     Health Maintenance Due  Topic Date Due   COVID-19 Vaccine (1) Never done   Hepatitis C Screening  Never done   INFLUENZA VACCINE  Never done    There are no preventive care reminders to display for this patient.  Lab Results  Component Value Date   TSH 1.84 12/07/2021   Lab Results   Component Value Date   WBC 6.9 05/31/2020   HGB 14.5 05/31/2020   HCT 43.5 05/31/2020   MCV 91 05/31/2020   PLT 322 05/31/2020   Lab Results  Component  Value Date   NA 139 11/16/2021   K 4.3 11/16/2021   CO2 25 11/16/2021   GLUCOSE 80 11/16/2021   BUN 16 11/16/2021   CREATININE 0.87 11/16/2021   BILITOT 0.3 11/16/2021   ALKPHOS 76 11/16/2021   AST 27 11/16/2021   ALT 30 11/16/2021   PROT 6.8 11/16/2021   ALBUMIN 4.6 11/16/2021   CALCIUM 9.4 11/16/2021   ANIONGAP 8 12/28/2016   EGFR 91 11/16/2021   GFR 61.57 02/24/2019   Lab Results  Component Value Date   HGBA1C 5.0 11/16/2021      Assessment & Plan:   Problem List Items Addressed This Visit       Musculoskeletal and Integument   Muscle spasm of left shoulder - Primary    This Flexeril given to help with this heat and ice as needed.  Patient to work on posture as well      Relevant Medications   cyclobenzaprine (FLEXERIL) 10 MG tablet   ibuprofen (ADVIL) 600 MG tablet     Other   Neck pain on left side    Muscle relaxer and ibuprofen given for over the weekend to use heat and ice as necessary with relief. Patient to follow-up with possible neck x-ray if no improvement of the pain in the next few days. Rest throughout the weekend as able Handout given to patient for neck pain      Relevant Medications   cyclobenzaprine (FLEXERIL) 10 MG tablet   ibuprofen (ADVIL) 600 MG tablet    Meds ordered this encounter  Medications   cyclobenzaprine (FLEXERIL) 10 MG tablet    Sig: Take 1 tablet (10 mg total) by mouth 3 (three) times daily as needed for muscle spasms.    Dispense:  30 tablet    Refill:  0    Order Specific Question:   Supervising Provider    Answer:   BEDSOLE, AMY E [[id number]]   ibuprofen (ADVIL) 600 MG tablet    Sig: Take 1 tablet (600 mg total) by mouth every 8 (eight) hours as needed.    Dispense:  30 tablet    Refill:  0    Order Specific Question:   Supervising Provider    Answer:    BEDSOLE, AMY E [[id number]]    Follow-up: Return if symptoms worsen or fail to improve.    Eugenia Pancoast, FNP

## 2022-02-26 DIAGNOSIS — M62838 Other muscle spasm: Secondary | ICD-10-CM | POA: Insufficient documentation

## 2022-02-26 DIAGNOSIS — M542 Cervicalgia: Secondary | ICD-10-CM | POA: Insufficient documentation

## 2022-02-26 NOTE — Assessment & Plan Note (Signed)
Muscle relaxer and ibuprofen given for over the weekend to use heat and ice as necessary with relief. Patient to follow-up with possible neck x-ray if no improvement of the pain in the next few days. Rest throughout the weekend as able Handout given to patient for neck pain

## 2022-02-26 NOTE — Assessment & Plan Note (Signed)
This Flexeril given to help with this heat and ice as needed.  Patient to work on posture as well

## 2022-03-01 ENCOUNTER — Encounter (INDEPENDENT_AMBULATORY_CARE_PROVIDER_SITE_OTHER): Payer: Self-pay

## 2022-03-07 ENCOUNTER — Ambulatory Visit
Admission: RE | Admit: 2022-03-07 | Discharge: 2022-03-07 | Disposition: A | Discharge status: Home / Self Care | Payer: 59 | Source: Ambulatory Visit | Attending: Advanced Practice Midwife | Admitting: Advanced Practice Midwife

## 2022-03-07 ENCOUNTER — Other Ambulatory Visit: Payer: Self-pay | Admitting: Advanced Practice Midwife

## 2022-03-07 ENCOUNTER — Ambulatory Visit: Payer: 59

## 2022-03-07 DIAGNOSIS — N6452 Nipple discharge: Secondary | ICD-10-CM

## 2022-03-08 ENCOUNTER — Telehealth: Payer: Self-pay | Admitting: Advanced Practice Midwife

## 2022-03-08 NOTE — Telephone Encounter (Signed)
Called patient per Clayton Bibles, CNM  request, regarding appointment on 04/19/22. Left voicemail message. Needs to confirm if patient wants to keep annual appointment that is scheduled with [facility] while she undergo's treatment for her breast abscess. Dr. Macon Large states it is okay for patient to wait for annual exam if she prefers.  ?

## 2022-03-12 ENCOUNTER — Encounter (INDEPENDENT_AMBULATORY_CARE_PROVIDER_SITE_OTHER): Payer: Self-pay | Admitting: Nurse Practitioner

## 2022-03-12 ENCOUNTER — Other Ambulatory Visit: Payer: Self-pay

## 2022-03-12 ENCOUNTER — Ambulatory Visit (INDEPENDENT_AMBULATORY_CARE_PROVIDER_SITE_OTHER): Payer: 59 | Admitting: Nurse Practitioner

## 2022-03-12 VITALS — BP 120/83 | HR 71 | Temp 97.8°F | Ht 65.0 in | Wt 176.0 lb

## 2022-03-12 DIAGNOSIS — Z6829 Body mass index (BMI) 29.0-29.9, adult: Secondary | ICD-10-CM

## 2022-03-12 DIAGNOSIS — E8881 Metabolic syndrome: Secondary | ICD-10-CM

## 2022-03-12 DIAGNOSIS — E039 Hypothyroidism, unspecified: Secondary | ICD-10-CM | POA: Diagnosis not present

## 2022-03-12 DIAGNOSIS — E88819 Insulin resistance, unspecified: Secondary | ICD-10-CM

## 2022-03-12 DIAGNOSIS — N643 Galactorrhea not associated with childbirth: Secondary | ICD-10-CM

## 2022-03-12 DIAGNOSIS — E669 Obesity, unspecified: Secondary | ICD-10-CM | POA: Diagnosis not present

## 2022-03-12 DIAGNOSIS — Z6833 Body mass index (BMI) 33.0-33.9, adult: Secondary | ICD-10-CM

## 2022-03-12 NOTE — Progress Notes (Deleted)
?TeleHealth Visit:  ?Due to the COVID-19 pandemic, this visit was completed with telemedicine (audio/video) technology to reduce patient and provider exposure as well as to preserve personal protective equipment.  ? ?[name] has verbally consented to this TeleHealth visit. The patient is located at home, the provider is located at home. The participants in this visit include the listed provider and patient. The visit was conducted today via MyChart video. ? ?OBESITY ?[name] is here to discuss her progress with her obesity treatment plan along with follow-up of her obesity related diagnoses.  ? ?Today's date: 03/12/2022 ?Today's visit was # 30 ?Starting weight: 199 lbs ?Starting date: 05/31/20 ?Total weight loss: *** lbs ?Weight at last in office visit: 179 lbs ?Today's reported weight: *** lbs No weight reported. ?Weight change since last visit: *** ? ?Today's visit was #: 29 ?Starting weight: 199 lbs ?Starting date: 05/31/2020 ?Today's weight: 179 lbs ?Today's date: 02/12/2022 ?Total lbs lost to date: 20 ?Total lbs lost since last in-office visit: 0 ? ?Interim History: *** ? ?Nutrition Plan: keeping a food journal and adhering to recommended goals of 1200 calories and 80 gms of protein.  ?Hunger is {EWCONTROLASSESSMENT:24261}. Cravings are {EWCONTROLASSESSMENT:24261}.  ?Current exercise: {exercise types:16438} ?Assessment/Plan:  ?*** ?Obesity: Current BMI *** ?[name] {CHL AMB IS/IS HTX:[id number]} currently in the action stage of change. As such, her goal is to {MWMwtloss#1:210800005}. She has agreed to {MWMwtlossportion/plan2:23431}.  ? ?Exercise goals: {MWM EXERCISE RECS:23473} ?Continue exercise: {exercise types:16438} ? ?Behavioral modification strategies: {MWMwtlossdietstrategies3:23432}. ? ?Jeanet has agreed to follow-up with our clinic in {NUMBER 1-10:22536} weeks.  ? ?No orders of the defined types were placed in this encounter. ? ? ?There are no discontinued medications.  ? ?No orders of the defined types  were placed in this encounter. ?   ? ?Objective:  ? ?VITALS: Per patient if applicable, see vitals. ?GENERAL: Alert and in no acute distress. ?CARDIOPULMONARY: No increased WOB. Speaking in clear sentences.  ?PSYCH: Pleasant and cooperative. Speech normal rate and rhythm. Affect is appropriate. Insight and judgement are appropriate. Attention is focused, linear, and appropriate.  ?NEURO: Oriented as arrived to appointment on time with no prompting.  ? ?Lab Results  ?Component Value Date  ? CREATININE 0.87 11/16/2021  ? BUN 16 11/16/2021  ? NA 139 11/16/2021  ? K 4.3 11/16/2021  ? CL 101 11/16/2021  ? CO2 25 11/16/2021  ? ?Lab Results  ?Component Value Date  ? ALT 30 11/16/2021  ? AST 27 11/16/2021  ? ALKPHOS 76 11/16/2021  ? BILITOT 0.3 11/16/2021  ? ?Lab Results  ?Component Value Date  ? HGBA1C 5.0 11/16/2021  ? HGBA1C 5.3 06/21/2021  ? HGBA1C 5.0 02/21/2021  ? HGBA1C 5.0 05/31/2020  ? ?Lab Results  ?Component Value Date  ? INSULIN 11.2 11/16/2021  ? INSULIN 10.6 06/21/2021  ? INSULIN 14.9 02/21/2021  ? INSULIN 17.8 10/24/2020  ? INSULIN 13.6 05/31/2020  ? ?Lab Results  ?Component Value Date  ? TSH 1.84 12/07/2021  ? ?Lab Results  ?Component Value Date  ? CHOL 214 (H) 11/16/2021  ? HDL 60 11/16/2021  ? LDLCALC 144 (H) 11/16/2021  ? TRIG 59 11/16/2021  ? CHOLHDL 3.3 06/21/2021  ? ?Lab Results  ?Component Value Date  ? WBC 6.9 05/31/2020  ? HGB 14.5 05/31/2020  ? HCT 43.5 05/31/2020  ? MCV 91 05/31/2020  ? PLT 322 05/31/2020  ? ?Lab Results  ?Component Value Date  ? IRON 57 02/24/2019  ? FERRITIN 30.7 02/24/2019  ? ?Lab Results  ?Component Value Date  ?  VD25OH 103.0 (H) 11/16/2021  ? VD25OH 44.3 06/21/2021  ? VD25OH 76.1 05/31/2020  ? ? ?Attestation Statements:  ? ?Reviewed by clinician on day of visit: allergies, medications, problem list, medical history, surgical history, family history, social history, and previous encounter notes. ? ?***(delete if time-based billing not used)Time spent on visit including  pre-visit chart review and post-visit charting and care was *** minutes.  ? ? ?

## 2022-03-12 NOTE — Telephone Encounter (Signed)
Spoke with [name] this morning via telephone regarding her appointment on 04/19/22. She is going to keep the appointment date and time.  ?

## 2022-03-13 NOTE — Progress Notes (Signed)
? ? ? ?Chief Complaint:  ? ?OBESITY ?[name] is here to discuss her progress with her obesity treatment plan along with follow-up of her obesity related diagnoses. [name] is on keeping a food journal and adhering to recommended goals of 1200-1250 calories and 85-90 grams of protein and states she is following her eating plan approximately 75% of the time. [name] states she is doing strength training for 60 minutes 3 times per week. ? ?Today's visit was #: 30 ?Starting weight: 199 lbs ?Starting date: 05/31/2020 ?Today's weight: 176 lbs ?Today's date: 03/12/2022 ?Total lbs lost to date: 23 lbs ?Total lbs lost since last in-office visit: 3 lbs ? ?Interim History: Enijah done well with weight loss. She is not skipping meals, but finds she struggles with choices and meal planning.  ? ?Subjective:  ? ?1. Insulin resistance ?[name] has been taking Metformin 500 mg consistently for the past 2 weeks. She denies side effects. She is doing better since taking Metformin on a regular basis.  ? ?2. Hypothyroidism, unspecified type ?[name] is currently taking Synthroid 50 mcg. She denies side effects. ? ?3. Galactorrhea ?[name] is scheduled for a biopsy this Friday. Seeing GYN.   ? ?Assessment/Plan:  ? ?1. Insulin resistance ?Pascale will continue Metformin 500 mg daily. Side effects were discussed with Maralyn Sago. She will continue to work on weight loss, exercise, and decreasing simple carbohydrates to help decrease the risk of diabetes. Kynadi agreed to follow-up with [name] as directed to closely monitor her progress. ? ?2. Hypothyroidism, unspecified type ?Shastina will continue to follow up with endocrinologist. She will continue taking Synthroid as directed. Orders and follow up as documented in patient record. ? ?Counseling ?Good thyroid control is important for overall health. Supratherapeutic thyroid levels are dangerous and will not improve weight loss results. ?Counseling: The correct way to take levothyroxine is fasting, with water, separated  by at least 30 minutes from breakfast, and separated by more than 4 hours from calcium, iron, multivitamins, acid reflux medications (PPIs).   ? ?3. Galactorrhea ?[name] will continue to follow up with gynecologist.  ? ?4. Obesity with current BMI of 29.29 ?[name] is currently in the action stage of change. As such, her goal is to continue with weight loss efforts. She has agreed to keeping a food journal and adhering to recommended goals of 1200-1250 calories and 85-90 grams of protein.  ? ?[name] will continue journaling.  ? ?Exercise goals:  As is. ? ?Behavioral modification strategies: increasing lean protein intake, increasing water intake, no skipping meals, and meal planning and cooking strategies. ? ?[name] has agreed to follow-up with our clinic in 4 weeks. She was informed of the importance of frequent follow-up visits to maximize her success with intensive lifestyle modifications for her multiple health conditions.  ? ?Objective:  ? ?Blood pressure 120/83, pulse 71, temperature 97.8 ?F (36.6 ?C), height 5\' 5"  (1.651 m), weight 176 lb (79.8 kg), SpO2 98 %. ?Body mass index is 29.29 kg/m?. ? ?General: Cooperative, alert, well developed, in no acute distress. ?HEENT: Conjunctivae and lids unremarkable. ?Cardiovascular: Regular rhythm.  ?Lungs: Normal work of breathing. ?Neurologic: No focal deficits.  ? ?Lab Results  ?Component Value Date  ? CREATININE 0.87 11/16/2021  ? BUN 16 11/16/2021  ? NA 139 11/16/2021  ? K 4.3 11/16/2021  ? CL 101 11/16/2021  ? CO2 25 11/16/2021  ? ?Lab Results  ?Component Value Date  ? ALT 30 11/16/2021  ? AST 27 11/16/2021  ? ALKPHOS 76 11/16/2021  ? BILITOT 0.3 11/16/2021  ? ?  Lab Results  ?Component Value Date  ? HGBA1C 5.0 11/16/2021  ? HGBA1C 5.3 06/21/2021  ? HGBA1C 5.0 02/21/2021  ? HGBA1C 5.0 05/31/2020  ? ?Lab Results  ?Component Value Date  ? INSULIN 11.2 11/16/2021  ? INSULIN 10.6 06/21/2021  ? INSULIN 14.9 02/21/2021  ? INSULIN 17.8 10/24/2020  ? INSULIN 13.6 05/31/2020   ? ?Lab Results  ?Component Value Date  ? TSH 1.84 12/07/2021  ? ?Lab Results  ?Component Value Date  ? CHOL 214 (H) 11/16/2021  ? HDL 60 11/16/2021  ? LDLCALC 144 (H) 11/16/2021  ? TRIG 59 11/16/2021  ? CHOLHDL 3.3 06/21/2021  ? ?Lab Results  ?Component Value Date  ? VD25OH 103.0 (H) 11/16/2021  ? VD25OH 44.3 06/21/2021  ? VD25OH 76.1 05/31/2020  ? ?Lab Results  ?Component Value Date  ? WBC 6.9 05/31/2020  ? HGB 14.5 05/31/2020  ? HCT 43.5 05/31/2020  ? MCV 91 05/31/2020  ? PLT 322 05/31/2020  ? ?Lab Results  ?Component Value Date  ? IRON 57 02/24/2019  ? FERRITIN 30.7 02/24/2019  ? ?Attestation Statements:  ? ?Reviewed by clinician on day of visit: allergies, medications, problem list, medical history, surgical history, family history, social history, and previous encounter notes. ? ?Time spent on visit including pre-visit chart review and post-visit care and charting was 30 minutes.  ? ?I, Jackson Latino, RMA, am acting as transcriptionist for Irene Limbo, FNP ? ?I have reviewed the above documentation for accuracy and completeness, and I agree with the above. Irene Limbo, FNP  ?

## 2022-03-16 ENCOUNTER — Ambulatory Visit
Admission: RE | Admit: 2022-03-16 | Discharge: 2022-03-16 | Disposition: A | Discharge status: Home / Self Care | Payer: 59 | Source: Ambulatory Visit | Attending: Advanced Practice Midwife | Admitting: Advanced Practice Midwife

## 2022-03-16 ENCOUNTER — Other Ambulatory Visit: Payer: Self-pay | Admitting: Diagnostic Radiology

## 2022-03-16 DIAGNOSIS — N6452 Nipple discharge: Secondary | ICD-10-CM

## 2022-03-26 ENCOUNTER — Other Ambulatory Visit: Payer: Self-pay

## 2022-03-26 ENCOUNTER — Encounter (INDEPENDENT_AMBULATORY_CARE_PROVIDER_SITE_OTHER): Payer: Self-pay | Admitting: Family Medicine

## 2022-03-26 ENCOUNTER — Ambulatory Visit (INDEPENDENT_AMBULATORY_CARE_PROVIDER_SITE_OTHER): Payer: 59 | Admitting: Family Medicine

## 2022-03-26 VITALS — BP 120/81 | HR 74 | Temp 97.9°F | Ht 65.0 in | Wt 180.0 lb

## 2022-03-26 DIAGNOSIS — E8881 Metabolic syndrome: Secondary | ICD-10-CM

## 2022-03-26 DIAGNOSIS — Z6830 Body mass index (BMI) 30.0-30.9, adult: Secondary | ICD-10-CM | POA: Diagnosis not present

## 2022-03-26 DIAGNOSIS — E669 Obesity, unspecified: Secondary | ICD-10-CM

## 2022-03-26 DIAGNOSIS — Z9189 Other specified personal risk factors, not elsewhere classified: Secondary | ICD-10-CM

## 2022-03-27 NOTE — Progress Notes (Signed)
? ? ? ?Chief Complaint:  ? ?OBESITY ?[name] is here to discuss her progress with her obesity treatment plan along with follow-up of her obesity related diagnoses. [name] is on keeping a food journal and adhering to recommended goals of 1200-1500 calories and 85-90 grams of protein daily and states she is following her eating plan approximately 75% of the time. [name] states she is doing 0 minutes 0 times per week. ? ?Today's visit was #: 31 ?Starting weight: 199 lbs ?Starting date: 05/31/2020 ?Today's weight: 180 lbs ?Today's date: 03/26/2022 ?Total lbs lost to date: 31 ?Total lbs lost since last in-office visit: 0 ? ?Interim History: Rayden did some celebration eating over the last week. She is retaining some fluid due to the increase in simple carbohydrates and likely Na+. She is ready to get back on track. She is especially going to work on meal planning, prepping, and journaling. ? ?Subjective:  ? ?1. Insulin resistance ?[name] is taking her metformin before bed and she is doing well with remembering to take it regularly.  ? ?2. At risk for activity intolerance ?[name] is at risk for exercise intolerance due to decreased exercise while recovering from neck strain and biopsy.  ? ?Assessment/Plan:  ? ?1. Insulin resistance ?[name] will continue metformin, and we will recheck labs at her next visit in 2 weeks. "Will continue to actively manage medications". ? ?2. At risk for activity intolerance ?[name] was given approximately 15 minutes of exercise intolerance counseling today. She is 31 y.o. female and has risk factors exercise intolerance including obesity. We discussed intensive lifestyle modifications today with an emphasis on specific weight loss instructions and strategies. [name] will slowly increase activity as tolerated. ? ?Repetitive spaced learning was employed today to elicit superior memory formation and behavioral change. ? ?3. Obesity with current BMI of 30.0 ?[name] is currently in the action stage of change.  As such, her goal is to continue with weight loss efforts. She has agreed to the Category 2 Plan or keeping a food journal and adhering to recommended goals of 1200-1500 calories and 85+ grams of protein daily.  ? ?We will recheck fasting labs at her next visit. ? ?Behavioral modification strategies: increasing lean protein intake. ? ?[name] has agreed to follow-up with our clinic in 2 weeks. She was informed of the importance of frequent follow-up visits to maximize her success with intensive lifestyle modifications for her multiple health conditions.  ? ?Objective:  ? ?Blood pressure 120/81, pulse 74, temperature 97.9 ?F (36.6 ?C), height 5\' 5"  (1.651 m), weight 180 lb (81.6 kg), SpO2 99 %. ?Body mass index is 29.95 kg/m?. ? ?General: Cooperative, alert, well developed, in no acute distress. ?HEENT: Conjunctivae and lids unremarkable. ?Cardiovascular: Regular rhythm.  ?Lungs: Normal work of breathing. ?Neurologic: No focal deficits.  ? ?Lab Results  ?Component Value Date  ? CREATININE 0.87 11/16/2021  ? BUN 16 11/16/2021  ? NA 139 11/16/2021  ? K 4.3 11/16/2021  ? CL 101 11/16/2021  ? CO2 25 11/16/2021  ? ?Lab Results  ?Component Value Date  ? ALT 30 11/16/2021  ? AST 27 11/16/2021  ? ALKPHOS 76 11/16/2021  ? BILITOT 0.3 11/16/2021  ? ?Lab Results  ?Component Value Date  ? HGBA1C 5.0 11/16/2021  ? HGBA1C 5.3 06/21/2021  ? HGBA1C 5.0 02/21/2021  ? HGBA1C 5.0 05/31/2020  ? ?Lab Results  ?Component Value Date  ? INSULIN 11.2 11/16/2021  ? INSULIN 10.6 06/21/2021  ? INSULIN 14.9 02/21/2021  ? INSULIN 17.8 10/24/2020  ?  INSULIN 13.6 05/31/2020  ? ?Lab Results  ?Component Value Date  ? TSH 1.84 12/07/2021  ? ?Lab Results  ?Component Value Date  ? CHOL 214 (H) 11/16/2021  ? HDL 60 11/16/2021  ? LDLCALC 144 (H) 11/16/2021  ? TRIG 59 11/16/2021  ? CHOLHDL 3.3 06/21/2021  ? ?Lab Results  ?Component Value Date  ? VD25OH 103.0 (H) 11/16/2021  ? VD25OH 44.3 06/21/2021  ? VD25OH 76.1 05/31/2020  ? ?Lab Results  ?Component Value  Date  ? WBC 6.9 05/31/2020  ? HGB 14.5 05/31/2020  ? HCT 43.5 05/31/2020  ? MCV 91 05/31/2020  ? PLT 322 05/31/2020  ? ?Lab Results  ?Component Value Date  ? IRON 57 02/24/2019  ? FERRITIN 30.7 02/24/2019  ? ?Attestation Statements:  ? ?Reviewed by clinician on day of visit: allergies, medications, problem list, medical history, surgical history, family history, social history, and previous encounter notes. ? ? ?I, Burt Knack, am acting as transcriptionist for Quillian Quince, MD. ? ?I have reviewed the above documentation for accuracy and completeness, and I agree with the above. -  Quillian Quince, MD ? ? ?

## 2022-04-02 ENCOUNTER — Ambulatory Visit (INDEPENDENT_AMBULATORY_CARE_PROVIDER_SITE_OTHER): Payer: 59 | Admitting: Family Medicine

## 2022-04-09 ENCOUNTER — Encounter (INDEPENDENT_AMBULATORY_CARE_PROVIDER_SITE_OTHER): Payer: Self-pay | Admitting: Nurse Practitioner

## 2022-04-09 ENCOUNTER — Ambulatory Visit (INDEPENDENT_AMBULATORY_CARE_PROVIDER_SITE_OTHER): Payer: 59 | Admitting: Nurse Practitioner

## 2022-04-09 VITALS — BP 118/79 | HR 67 | Temp 97.9°F | Ht 65.0 in | Wt 178.0 lb

## 2022-04-09 DIAGNOSIS — E673 Hypervitaminosis D: Secondary | ICD-10-CM | POA: Diagnosis not present

## 2022-04-09 DIAGNOSIS — E039 Hypothyroidism, unspecified: Secondary | ICD-10-CM

## 2022-04-09 DIAGNOSIS — E8881 Metabolic syndrome: Secondary | ICD-10-CM | POA: Diagnosis not present

## 2022-04-09 DIAGNOSIS — E785 Hyperlipidemia, unspecified: Secondary | ICD-10-CM

## 2022-04-09 DIAGNOSIS — E669 Obesity, unspecified: Secondary | ICD-10-CM

## 2022-04-09 DIAGNOSIS — Z6829 Body mass index (BMI) 29.0-29.9, adult: Secondary | ICD-10-CM

## 2022-04-09 MED ORDER — METFORMIN HCL 500 MG PO TABS: 500.0000 mg | ORAL_TABLET | Freq: Two times a day (BID) | ORAL | 0 refills | Status: DC

## 2022-04-10 LAB — LIPID PANEL WITH LDL/HDL RATIO
Cholesterol, Total: 202 mg/dL — ABNORMAL HIGH (ref 100–199)
HDL: 61 mg/dL (ref >39)
LDL Chol Calc (NIH): 125 mg/dL — ABNORMAL HIGH (ref 0–99)
LDL/HDL Ratio: 2 ratio (ref 0.0–3.2)
Triglycerides: 90 mg/dL (ref 0–149)
VLDL Cholesterol Cal: 16 mg/dL (ref 5–40)

## 2022-04-10 LAB — COMPREHENSIVE METABOLIC PANEL
ALT: 30 IU/L (ref 0–32)
AST: 23 IU/L (ref 0–40)
Albumin/Globulin Ratio: 2 (ref 1.2–2.2)
Albumin: 4.5 g/dL (ref 3.8–4.8)
Alkaline Phosphatase: 76 IU/L (ref 44–121)
BUN/Creatinine Ratio: 12 (ref 9–23)
BUN: 11 mg/dL (ref 6–20)
Bilirubin Total: 0.3 mg/dL (ref 0.0–1.2)
CO2: 24 mmol/L (ref 20–29)
Calcium: 9.4 mg/dL (ref 8.7–10.2)
Chloride: 102 mmol/L (ref 96–106)
Creatinine, Ser: 0.89 mg/dL (ref 0.57–1.00)
Globulin, Total: 2.3 g/dL (ref 1.5–4.5)
Glucose: 77 mg/dL (ref 70–99)
Potassium: 4.4 mmol/L (ref 3.5–5.2)
Sodium: 140 mmol/L (ref 134–144)
Total Protein: 6.8 g/dL (ref 6.0–8.5)
eGFR: 88 mL/min/{1.73_m2} (ref >59)

## 2022-04-10 LAB — INSULIN, RANDOM: INSULIN: 7.5 u[IU]/mL (ref 2.6–24.9)

## 2022-04-10 LAB — VITAMIN D 25 HYDROXY (VIT D DEFICIENCY, FRACTURES): Vit D, 25-Hydroxy: 36.4 ng/mL (ref 30.0–100.0)

## 2022-04-10 LAB — TSH: TSH: 1.72 u[IU]/mL (ref 0.450–4.500)

## 2022-04-10 LAB — HEMOGLOBIN A1C
Est. average glucose Bld gHb Est-mCnc: 100 mg/dL
Hgb A1c MFr Bld: 5.1 % (ref 4.8–5.6)

## 2022-04-10 NOTE — Progress Notes (Signed)
? ? ? ?Chief Complaint:  ? ?OBESITY ?[name] is here to discuss her progress with her obesity treatment plan along with follow-up of her obesity related diagnoses. [name] is on the Category 2 Plan and keeping a food journal and adhering to recommended goals of 1250 calories and 90-110 grams of protein and states she is following her eating plan approximately 90% of the time. [name] states she is doing strength training for 60 minutes 3 times per week. ? ?Today's visit was #: 72 ?Starting weight: 199 lbs ?Starting date: 05/31/2020 ?Today's weight: 178 lbs ?Today's date: 04/09/2022 ?Total lbs lost to date: 21 lbs ?Total lbs lost since last in-office visit: 0 ? ?Interim History: [name] has done well with weight loss. She feels that journaling is easier for her to follow. Her husband is supportive. She is exercising with her husband and she works at [employer]. She finds that she cycles with getting on and off track. She drinking water daily and occasionally Red Bull.  ? ?Subjective:  ? ?1. Insulin resistance ?[name] increased her Metformin 500 mg to twice a day 2 weeks ago. She denies side effects. She feels this helpful and would like to continue this dose.  ? ?2. Hypothyroidism, unspecified type ?[name] is currently taking Synthroid 50 mcg. She denies side effects.  ? ?3. High vitamin D level ?[name] is not taking Vitamin D. She denies nausea, vomiting and muscle weakness.  ? ?4. Hyperlipidemia, unspecified hyperlipidemia type ?Not on a statin.   ? ?Assessment/Plan:  ? ?1. Insulin resistance ?We will refill Metformin 500 mg. We discussed side effects. Labs were obtained today. [name] will continue to work on weight loss, exercise, and decreasing simple carbohydrates to help decrease the risk of diabetes. [name] agreed to follow-up with [name] as directed to closely monitor her progress. ? ?- metFORMIN (GLUCOPHAGE) 500 MG tablet; Take 1 tablet (500 mg total) by mouth 2 (two) times daily with a meal. Take one tab with breakfast   Dispense: 60 tablet; Refill: 0 ?- Comprehensive metabolic panel ?- Hemoglobin A1c ?- Insulin, random ? ?2. Hypothyroidism, unspecified type ?Labs were obtained today. [name] will continue to follow up with Endo. She will continue medication as directed. Orders and follow up as documented in patient record. ? ?Counseling ?Good thyroid control is important for overall health. Supratherapeutic thyroid levels are dangerous and will not improve weight loss results. ?Counseling: The correct way to take levothyroxine is fasting, with water, separated by at least 30 minutes from breakfast, and separated by more than 4 hours from calcium, iron, multivitamins, acid reflux medications (PPIs).   ? ?- Comprehensive metabolic panel ?- TSH ? ?3. High vitamin D level ?Low Vitamin D level contributes to fatigue and are associated with obesity, breast, and colon cancer. She agrees to continue to take prescription Vitamin D 50,000 IU every week and [name] will follow-up for routine testing of Vitamin D, at least 2-3 times per year to avoid over-replacement. Labs were obtained today.  ? ?- Comprehensive metabolic panel ?- VITAMIN D 25 Hydroxy (Vit-D Deficiency, Fractures) ? ?4. Hyperlipidemia, unspecified hyperlipidemia type ?Cardiovascular risk and specific lipid/LDL goals reviewed.  Labs were obtained today. We discussed several lifestyle modifications today and [name] will continue to work on diet, exercise and weight loss efforts. Orders and follow up as documented in patient record.  ? ?Counseling ?Intensive lifestyle modifications are the first line treatment for this issue. ?Dietary changes: Increase soluble fiber. Decrease simple carbohydrates. ?Exercise changes: Moderate to vigorous-intensity aerobic activity 150 minutes  per week if tolerated. ?Lipid-lowering medications: see documented in medical record. ? ?- Comprehensive metabolic panel ?- Lipid Panel With LDL/HDL Ratio ? ?5. Obesity with current BMI of 29.7 ?[name] is  currently in the action stage of change. As such, her goal is to continue with weight loss efforts. She has agreed to the Category 2 Plan and keeping a food journal and adhering to recommended goals of 1200-1500 calories and 85 grams of protein.  ? ?Exercise goals:  As is. ? ?Behavioral modification strategies: increasing lean protein intake, increasing water intake, and no skipping meals. ? ?[name] has agreed to follow-up with our clinic in 3 weeks. She was informed of the importance of frequent follow-up visits to maximize her success with intensive lifestyle modifications for her multiple health conditions.  ? ?Objective:  ? ?Blood pressure 118/79, pulse 67, temperature 97.9 ?F (36.6 ?C), height 5\' 5"  (1.651 m), weight 178 lb (80.7 kg), SpO2 100 %. ?Body mass index is 29.62 kg/m?. ? ?General: Cooperative, alert, well developed, in no acute distress. ?HEENT: Conjunctivae and lids unremarkable. ?Cardiovascular: Regular rhythm.  ?Lungs: Normal work of breathing. ?Neurologic: No focal deficits.  ? ?Lab Results  ?Component Value Date  ? CREATININE 0.89 04/09/2022  ? BUN 11 04/09/2022  ? NA 140 04/09/2022  ? K 4.4 04/09/2022  ? CL 102 04/09/2022  ? CO2 24 04/09/2022  ? ?Lab Results  ?Component Value Date  ? ALT 30 04/09/2022  ? AST 23 04/09/2022  ? ALKPHOS 76 04/09/2022  ? BILITOT 0.3 04/09/2022  ? ?Lab Results  ?Component Value Date  ? HGBA1C 5.1 04/09/2022  ? HGBA1C 5.0 11/16/2021  ? HGBA1C 5.3 06/21/2021  ? HGBA1C 5.0 02/21/2021  ? HGBA1C 5.0 05/31/2020  ? ?Lab Results  ?Component Value Date  ? INSULIN 7.5 04/09/2022  ? INSULIN 11.2 11/16/2021  ? INSULIN 10.6 06/21/2021  ? INSULIN 14.9 02/21/2021  ? INSULIN 17.8 10/24/2020  ? ?Lab Results  ?Component Value Date  ? TSH 1.720 04/09/2022  ? ?Lab Results  ?Component Value Date  ? CHOL 202 (H) 04/09/2022  ? HDL 61 04/09/2022  ? LDLCALC 125 (H) 04/09/2022  ? TRIG 90 04/09/2022  ? CHOLHDL 3.3 06/21/2021  ? ?Lab Results  ?Component Value Date  ? VD25OH 36.4 04/09/2022  ?  VD25OH 103.0 (H) 11/16/2021  ? VD25OH 44.3 06/21/2021  ? ?Lab Results  ?Component Value Date  ? WBC 6.9 05/31/2020  ? HGB 14.5 05/31/2020  ? HCT 43.5 05/31/2020  ? MCV 91 05/31/2020  ? PLT 322 05/31/2020  ? ?Lab Results  ?Component Value Date  ? IRON 57 02/24/2019  ? FERRITIN 30.7 02/24/2019  ? ?Attestation Statements:  ? ?Reviewed by clinician on day of visit: allergies, medications, problem list, medical history, surgical history, family history, social history, and previous encounter notes. ? ?I, Lizbeth Bark, RMA, am acting as Location manager for Everardo Pacific, FNP.  ? ?I have reviewed the above documentation for accuracy and completeness, and I agree with the above. Everardo Pacific, FNP ? ?

## 2022-04-19 ENCOUNTER — Ambulatory Visit (INDEPENDENT_AMBULATORY_CARE_PROVIDER_SITE_OTHER): Payer: 59 | Admitting: Advanced Practice Midwife

## 2022-04-19 ENCOUNTER — Other Ambulatory Visit (HOSPITAL_COMMUNITY)
Admission: RE | Admit: 2022-04-19 | Discharge: 2022-04-19 | Disposition: A | Discharge status: Home / Self Care | Payer: 59 | Source: Ambulatory Visit | Attending: Advanced Practice Midwife | Admitting: Advanced Practice Midwife

## 2022-04-19 ENCOUNTER — Encounter: Payer: Self-pay | Admitting: Advanced Practice Midwife

## 2022-04-19 VITALS — BP 130/83 | HR 67 | Ht 65.0 in | Wt 179.0 lb

## 2022-04-19 DIAGNOSIS — N941 Unspecified dyspareunia: Secondary | ICD-10-CM

## 2022-04-19 DIAGNOSIS — G43829 Menstrual migraine, not intractable, without status migrainosus: Secondary | ICD-10-CM

## 2022-04-19 DIAGNOSIS — Z8742 Personal history of other diseases of the female genital tract: Secondary | ICD-10-CM

## 2022-04-19 DIAGNOSIS — Z9889 Other specified postprocedural states: Secondary | ICD-10-CM | POA: Diagnosis not present

## 2022-04-19 DIAGNOSIS — N6452 Nipple discharge: Secondary | ICD-10-CM | POA: Diagnosis not present

## 2022-04-19 DIAGNOSIS — Z01419 Encounter for gynecological examination (general) (routine) without abnormal findings: Secondary | ICD-10-CM | POA: Diagnosis present

## 2022-04-19 DIAGNOSIS — Z319 Encounter for procreative management, unspecified: Secondary | ICD-10-CM

## 2022-04-19 MED ORDER — ONDANSETRON 4 MG PO TBDP: 4.0000 mg | ORAL_TABLET | Freq: Four times a day (QID) | ORAL | 0 refills | Status: DC | PRN

## 2022-04-19 NOTE — Progress Notes (Signed)
? ? ?GYNECOLOGY ANNUAL PREVENTATIVE CARE ENCOUNTER NOTE ? ?History:    ? [name] is a 31 y.o. G28P1001 female here for a routine annual gynecologic exam.  Current complaints: patient is unsure about maintaining OCPs for period regulation vs possibly becoming pregnant in the next year.  She was started on her OCP after a consult with Dr, April Manson to manage nausea and migraines which occur during her menstrual cycle. Denies abnormal vaginal bleeding, discharge, pelvic pain, problems with intercourse or other gynecologic concerns.  ?  ?Gynecologic History ?No LMP recorded. (Menstrual status: Oral contraceptives). ?Contraception: OCP (estrogen/progesterone) ?Last Pap: 12/2019. Result was normal with negative HPV ?Last Mammogram: 03/07/2022.  Result was abnormal: calcifications, s/p biopsy, negative for malignancy ? ?Obstetric History ?OB History  ?Gravida Para Term Preterm AB Living  ?3 1 1  0 0 1  ?SAB IAB Ectopic Multiple Live Births  ?0 0 0 0 1  ?  ?# Outcome Date GA Lbr Len/2nd Weight Sex Delivery Anes PTL Lv  ?3 Term 05/09/12 [redacted]w[redacted]d 10:02 / 00:57 6 lb 3.7 oz (2.825 kg) F Vag-Spont Gen  LIV  ?2 Gravida           ?1 Gravida           ? ? ?Past Medical History:  ?Diagnosis Date  ? Fatty liver   ? Headache   ? Migraines  ? History of chicken pox   ? Hypothyroidism   ? IBS (irritable bowel syndrome)   ? Microscopic colitis   ? PCOS (polycystic ovarian syndrome)   ? Urinary tract infection   ? Vertigo   ? ? ?Past Surgical History:  ?Procedure Laterality Date  ? LAPAROSCOPIC OVARIAN CYSTECTOMY Bilateral 07/08/2019  ? Procedure: LAPAROSCOPIC OVARIAN CYSTECTOMY with peritoneal biopsies;  Surgeon: 09/08/2019, MD;  Location: [facility];  Service: Gynecology;  Laterality: Bilateral;  ? TONSILLECTOMY    ? adenoidectomy  ? WISDOM TOOTH EXTRACTION    ? ? ?Current Outpatient Medications on File Prior to Visit  ?Medication Sig Dispense Refill  ? levothyroxine (SYNTHROID) 50 MCG tablet TAKE 1 TABLET BY MOUTH  EVERY DAY 90 tablet 0  ? metFORMIN (GLUCOPHAGE) 500 MG tablet Take 1 tablet (500 mg total) by mouth 2 (two) times daily with a meal. Take one tab with breakfast 60 tablet 0  ? SRONYX 0.1-20 MG-MCG tablet TAKE 1 TABLET BY MOUTH EVERY DAY (SKIP PLACEBOS) 112 tablet 1  ? valACYclovir (VALTREX) 1000 MG tablet Take 1 tablet (1,000 mg total) by mouth 2 (two) times daily. 6 tablet 2  ? ?No current facility-administered medications on file prior to visit.  ? ? ?Allergies  ?Allergen Reactions  ? Erythromycin   ? Penicillins Hives  ?  Can tolerate cephalosporins  ? ? ?Social History:  reports that she has never smoked. She has never used smokeless tobacco. She reports current alcohol use. She reports that she does not use drugs. ? ?Family History  ?Problem Relation Age of Onset  ? Diabetes Maternal Grandmother   ? Breast cancer Maternal Grandmother 71  ? Parkinson's disease Maternal Grandmother   ? Thyroid disease Father   ? Sleep apnea Mother   ? Obesity Mother   ? Kidney disease Paternal Grandmother   ? Thyroid disease Paternal Grandmother   ? Healthy Brother   ? Healthy Daughter   ? Anesthesia problems Neg Hx   ? ? ?The following portions of the patient's history were reviewed and updated as appropriate: allergies, current medications, past family  history, past medical history, past social history, past surgical history and problem list. ? ?Review of Systems ?Pertinent items noted in HPI and remainder of comprehensive ROS otherwise negative. ? ?Physical Exam:  ?BP 130/83   Pulse 67   Ht 5\' 5"  (1.651 m)   Wt 179 lb (81.2 kg)   BMI 29.79 kg/m?  ?CONSTITUTIONAL: Well-developed, well-nourished female in no acute distress.  ?HENT:  Normocephalic, atraumatic, External right and left ear normal.  ?EYES: Conjunctivae and EOM are normal. Pupils are equal, round, and reactive to light. No scleral icterus.  ?SKIN: Skin is warm and dry. No rash noted. Not diaphoretic. No erythema. No pallor. ?MUSCULOSKELETAL: Normal range of  motion. No tenderness.  No cyanosis, clubbing, or edema. ?NEUROLOGIC: Alert and oriented to person, place, and time. Normal reflexes, muscle tone coordination.  ?PSYCHIATRIC: Normal mood and affect. Normal behavior. Normal judgment and thought content. ?CARDIOVASCULAR: Normal heart rate noted, regular rhythm ?RESPIRATORY: Clear to auscultation bilaterally. Effort and breath sounds normal, no problems with respiration noted. ?BREASTS: Symmetric in size. No masses, tenderness, skin changes, nipple drainage, or lymphadenopathy bilaterally. Performed in the presence of a chaperone. ?ABDOMEN: Soft, no distention noted.  No tenderness, rebound or guarding.  ?PELVIC: Normal appearing external genitalia and urethral meatus; normal appearing vaginal mucosa and cervix.  No abnormal vaginal discharge noted.  Pap smear obtained.  Normal uterine size, no other palpable masses, no uterine or adnexal tenderness.  Performed in the presence of a chaperone. ?  ?Assessment and Plan:  ?  1. Well woman exam with routine gynecological exam ?- No abnormal findings on physical exam ?- Biopsy site healing well ?- Cytology - PAP ? ?2. Dyspareunia in female ?- Recurrent, position dependent ?- Ambulatory referral to Physical Therapy ? ?3. Nipple discharge ?- Stable, s/p evaluation with breast specialist ? ?4. History of removal of ovarian cyst ? ? ?5. Desire for pregnancy ?- Current OCP initiated by fertility specialist  ?- Patient has never d/c'd OCP and focused on symptom management for nausea and migraines ? ?6. Menstrual migraine without status migrainosus, not intractable ?- Magnesium for prophylaxis, Excedrin Migraine OTC ?- Can schedule with [facility] as needed for focused regimen ? ?Will follow up results of pap smear and manage accordingly. ?Routine preventative health maintenance measures emphasized. ?Please refer to After Visit Summary for other counseling recommendations.  ?   ? ? , MSA, MSN, CNM ?Certified Nurse Midwife,  Faculty Practice ?[facility], [facility] ? ? ?

## 2022-04-19 NOTE — Progress Notes (Signed)
Patient presents for Annual Exam. ? ?LMP:04/13/22 ?Last pap:12/10/2019 WNL no Hx of abnormal paps.  ?Contraception: OCP's ?Family Hx of Breast Cancer: None  ?STD Screening: Declines  ? ?Pt had labs done a week ago with weightloss doctor. ? ? ? ?CC: starting period after not having it for 6 yrs notes spotting onset 04/13/22 pt states bleeding was light enough to wear a light tampon  ? ? ? ?

## 2022-04-22 ENCOUNTER — Other Ambulatory Visit: Payer: Self-pay | Admitting: Internal Medicine

## 2022-04-24 LAB — CYTOLOGY - PAP
Comment: NEGATIVE
Diagnosis: NEGATIVE
High risk HPV: NEGATIVE

## 2022-04-26 ENCOUNTER — Other Ambulatory Visit: Payer: Self-pay | Admitting: *Deleted

## 2022-04-26 MED ORDER — METRONIDAZOLE 500 MG PO TABS
500.0000 mg | ORAL_TABLET | Freq: Two times a day (BID) | ORAL | 0 refills | Status: DC
Start: 2022-04-26 — End: 2022-05-29

## 2022-05-07 ENCOUNTER — Encounter (INDEPENDENT_AMBULATORY_CARE_PROVIDER_SITE_OTHER): Payer: Self-pay | Admitting: Nurse Practitioner

## 2022-05-07 ENCOUNTER — Ambulatory Visit (INDEPENDENT_AMBULATORY_CARE_PROVIDER_SITE_OTHER): Payer: 59 | Admitting: Nurse Practitioner

## 2022-05-07 VITALS — BP 124/80 | HR 73 | Temp 97.7°F | Ht 65.0 in | Wt 179.0 lb

## 2022-05-07 DIAGNOSIS — E669 Obesity, unspecified: Secondary | ICD-10-CM | POA: Diagnosis not present

## 2022-05-07 DIAGNOSIS — E673 Hypervitaminosis D: Secondary | ICD-10-CM

## 2022-05-07 DIAGNOSIS — E785 Hyperlipidemia, unspecified: Secondary | ICD-10-CM | POA: Diagnosis not present

## 2022-05-07 DIAGNOSIS — Z6829 Body mass index (BMI) 29.0-29.9, adult: Secondary | ICD-10-CM

## 2022-05-07 DIAGNOSIS — E8881 Metabolic syndrome: Secondary | ICD-10-CM | POA: Diagnosis not present

## 2022-05-07 MED ORDER — METFORMIN HCL 500 MG PO TABS: 500.0000 mg | ORAL_TABLET | Freq: Two times a day (BID) | ORAL | 0 refills | Status: DC

## 2022-05-09 ENCOUNTER — Other Ambulatory Visit (INDEPENDENT_AMBULATORY_CARE_PROVIDER_SITE_OTHER): Payer: Self-pay | Admitting: Nurse Practitioner

## 2022-05-09 DIAGNOSIS — E8881 Metabolic syndrome: Secondary | ICD-10-CM

## 2022-05-09 NOTE — Progress Notes (Signed)
? ? ? ?Chief Complaint:  ? ?OBESITY ?[name] is here to discuss her progress with her obesity treatment plan along with follow-up of her obesity related diagnoses. [name] is on the Category 2 Plan and keeping a food journal and adhering to recommended goals of 1200-1800 calories and 85 grams of protein and states she is following her eating plan approximately 50% of the time. [name] states she is strength for 60 minutes 2 times per week. ? ?Today's visit was #: 33 ?Starting weight: 199 lbs ?Starting date: 05/31/2020 ?Today's weight: 179 lbs ?Today's date: 05/07/2022 ?Total lbs lost to date: 20 lbs ?Total lbs lost since last in-office visit: 0 ? ?Interim History: [name] was in [place] for a conference since her last visit and all of her meals were provided.  Notes she got off track due to her conference. She plans to get back on track today. She is trying to increase her water intake. Her daughter's birthday and birthday party is this week. Planning to have a pizza party.   ? ?Subjective:  ? ?1. Hyperlipidemia, unspecified hyperlipidemia type ?[name]'s labs looked better today. She is not on a statin.  ? ?2. High vitamin D level ?[name]'s last Vitamin D dropped from 103 to 36.4. She is currently not taking any Vitamin D supplements.  ? ?3. Insulin resistance ?[name] is taking Metformin 1000 mg bedtime. She denies side effects. She notes some abdominal craving if takes twice daily.  ? ?Assessment/Plan:  ? ?1. Hyperlipidemia, unspecified hyperlipidemia type ?Cardiovascular risk and specific lipid/LDL goals reviewed. We discussed labs today. We discussed several lifestyle modifications today and [name] will continue to work on diet, exercise and weight loss efforts. Orders and follow up as documented in patient record.  ? ?Counseling ?Intensive lifestyle modifications are the first line treatment for this issue. ?Dietary changes: Increase soluble fiber. Decrease simple carbohydrates. ?Exercise changes: Moderate to  vigorous-intensity aerobic activity 150 minutes per week if tolerated. ?Lipid-lowering medications: see documented in medical record. ? ?2. High vitamin D level ?Low Vitamin D level contributes to fatigue and are associated with obesity, breast, and colon cancer. [name] agrees to take Vitamin D 2,000 IU daily and she will follow-up for routine testing of Vitamin D, at least 2-3 times per year to avoid over-replacement. We will continue to monitor. We discussed labs today.  ? ?3. Insulin resistance ?[name] will continue as directed. We will refill Metformin 500 mg twice daily for 1 month with no refills. We discussed labs today. She will take both tablets at bedtimes. to work on weight loss, exercise, and decreasing simple carbohydrates to help decrease the risk of diabetes. [name] agreed to follow-up with [name] as directed to closely monitor her progress. ? ?- metFORMIN (GLUCOPHAGE) 500 MG tablet; Take 1 tablet (500 mg total) by mouth 2 (two) times daily with a meal. Take one tab with breakfast  Dispense: 60 tablet; Refill: 0 ? ?4. Obesity with current BMI of 29.8 ?[name] is currently in the action stage of change. As such, her goal is to continue with weight loss efforts. She has agreed to keeping a food journal and adhering to recommended goals of 1250 calories and 90-100 grams of  protein.  ? ?Exercise goals:  As is.  ? ?Behavioral modification strategies: increasing lean protein intake, increasing water intake, no skipping meals, and meal planning and cooking strategies. ? ?[name] has agreed to follow-up with our clinic in 3 weeks. She was informed of the importance of frequent follow-up visits to maximize her success  with intensive lifestyle modifications for her multiple health conditions.  ? ?Objective:  ? ?Blood pressure 124/80, pulse 73, temperature 97.7 ?F (36.5 ?C), height 5\' 5"  (1.651 m), weight 179 lb (81.2 kg), SpO2 99 %. ?Body mass index is 29.79 kg/m?. ? ?General: Cooperative, alert, well developed, in no  acute distress. ?HEENT: Conjunctivae and lids unremarkable. ?Cardiovascular: Regular rhythm.  ?Lungs: Normal work of breathing. ?Neurologic: No focal deficits.  ? ?Lab Results  ?Component Value Date  ? CREATININE 0.89 04/09/2022  ? BUN 11 04/09/2022  ? NA 140 04/09/2022  ? K 4.4 04/09/2022  ? CL 102 04/09/2022  ? CO2 24 04/09/2022  ? ?Lab Results  ?Component Value Date  ? ALT 30 04/09/2022  ? AST 23 04/09/2022  ? ALKPHOS 76 04/09/2022  ? BILITOT 0.3 04/09/2022  ? ?Lab Results  ?Component Value Date  ? HGBA1C 5.1 04/09/2022  ? HGBA1C 5.0 11/16/2021  ? HGBA1C 5.3 06/21/2021  ? HGBA1C 5.0 02/21/2021  ? HGBA1C 5.0 05/31/2020  ? ?Lab Results  ?Component Value Date  ? INSULIN 7.5 04/09/2022  ? INSULIN 11.2 11/16/2021  ? INSULIN 10.6 06/21/2021  ? INSULIN 14.9 02/21/2021  ? INSULIN 17.8 10/24/2020  ? ?Lab Results  ?Component Value Date  ? TSH 1.720 04/09/2022  ? ?Lab Results  ?Component Value Date  ? CHOL 202 (H) 04/09/2022  ? HDL 61 04/09/2022  ? LDLCALC 125 (H) 04/09/2022  ? TRIG 90 04/09/2022  ? CHOLHDL 3.3 06/21/2021  ? ?Lab Results  ?Component Value Date  ? VD25OH 36.4 04/09/2022  ? VD25OH 103.0 (H) 11/16/2021  ? VD25OH 44.3 06/21/2021  ? ?Lab Results  ?Component Value Date  ? WBC 6.9 05/31/2020  ? HGB 14.5 05/31/2020  ? HCT 43.5 05/31/2020  ? MCV 91 05/31/2020  ? PLT 322 05/31/2020  ? ?Lab Results  ?Component Value Date  ? IRON 57 02/24/2019  ? FERRITIN 30.7 02/24/2019  ? ?Attestation Statements:  ? ?Reviewed by clinician on day of visit: allergies, medications, problem list, medical history, surgical history, family history, social history, and previous encounter notes. ? ?Time spent on visit including pre-visit chart review and post-visit care and charting was 30 minutes.  ? ?I, 02/26/2019, RMA, am acting as [place] [employer] for Energy manager, FNP. ? ?I have reviewed the above documentation for accuracy and completeness, and I agree with the above. Irene Limbo, FNP ? ?

## 2022-05-29 ENCOUNTER — Encounter (INDEPENDENT_AMBULATORY_CARE_PROVIDER_SITE_OTHER): Payer: Self-pay | Admitting: Adult Health

## 2022-05-29 ENCOUNTER — Ambulatory Visit (INDEPENDENT_AMBULATORY_CARE_PROVIDER_SITE_OTHER): Payer: 59 | Admitting: Adult Health

## 2022-05-29 VITALS — BP 116/77 | HR 65 | Temp 97.9°F | Ht 65.0 in | Wt 175.0 lb

## 2022-05-29 DIAGNOSIS — E8881 Metabolic syndrome: Secondary | ICD-10-CM | POA: Diagnosis not present

## 2022-05-29 DIAGNOSIS — E669 Obesity, unspecified: Secondary | ICD-10-CM

## 2022-05-29 DIAGNOSIS — Z6829 Body mass index (BMI) 29.0-29.9, adult: Secondary | ICD-10-CM | POA: Diagnosis not present

## 2022-05-29 MED ORDER — METFORMIN HCL 500 MG PO TABS: ORAL_TABLET | ORAL | 0 refills | Status: DC

## 2022-05-30 NOTE — Progress Notes (Signed)
Chief Complaint:   OBESITY Ethlyn is here to discuss her progress with her obesity treatment plan along with follow-up of her obesity related diagnoses. Kaylaann is on keeping a food journal and adhering to recommended goals of 1250 calories and 90-100 gms protein daily and states she is following her eating plan approximately 75% of the time. Eliyana states she is doing strength/agility training for 60 minutes 3-4 times per week.  Today's visit was #: 34 Starting weight: 199 lbs Starting date: 05/31/2020 Today's weight: 175 lbs Today's date: 05/29/22 Total lbs lost to date: 24 lbs Total lbs lost since last in-office visit: -4  Interim History:  She is attempting to wean off birth control-Sronyx 0.1-20 mg-mcg.   She was started on oral birth control in 2018 due to menorrhagia (nausea/headache). She has been on placebo pills the last 20 days.  Of note: She has a stepson (age 27) and daughter (age 53). No active plans for additional pregnancy.  Subjective:   1. Insulin resistance She is currently on metformin 500 mg twice daily with meals-she is taking both tablets at night. She denies GI upset.  Assessment/Plan:   1. Insulin resistance Refill - metFORMIN (GLUCOPHAGE) 500 MG tablet; 2 tabs with supper  Dispense: 60 tablet; Refill: 0  2. Obesity with current BMI of 29.2 Kailey is currently in the action stage of change. As such, her goal is to continue with weight loss efforts. She has agreed to keeping a food journal and adhering to recommended goals of 1250 calories and 90-100 gms protein daily.   Exercise goals:  as is.  Behavioral modification strategies: increasing lean protein intake, decreasing simple carbohydrates, meal planning and cooking strategies, keeping healthy foods in the home, and planning for success.  Dollye has agreed to follow-up with our clinic in 3-4 weeks. She was informed of the importance of frequent follow-up visits to maximize her success with intensive  lifestyle modifications for her multiple health conditions.    Objective:   Blood pressure 116/77, pulse 65, temperature 97.9 F (36.6 C), height 5\' 5"  (1.651 m), weight 175 lb (79.4 kg), SpO2 98 %. Body mass index is 29.12 kg/m.  General: Cooperative, alert, well developed, in no acute distress. HEENT: Conjunctivae and lids unremarkable. Cardiovascular: Regular rhythm.  Lungs: Normal work of breathing. Neurologic: No focal deficits.   Lab Results  Component Value Date   CREATININE 0.89 04/09/2022   BUN 11 04/09/2022   NA 140 04/09/2022   K 4.4 04/09/2022   CL 102 04/09/2022   CO2 24 04/09/2022   Lab Results  Component Value Date   ALT 30 04/09/2022   AST 23 04/09/2022   ALKPHOS 76 04/09/2022   BILITOT 0.3 04/09/2022   Lab Results  Component Value Date   HGBA1C 5.1 04/09/2022   HGBA1C 5.0 11/16/2021   HGBA1C 5.3 06/21/2021   HGBA1C 5.0 02/21/2021   HGBA1C 5.0 05/31/2020   Lab Results  Component Value Date   INSULIN 7.5 04/09/2022   INSULIN 11.2 11/16/2021   INSULIN 10.6 06/21/2021   INSULIN 14.9 02/21/2021   INSULIN 17.8 10/24/2020   Lab Results  Component Value Date   TSH 1.720 04/09/2022   Lab Results  Component Value Date   CHOL 202 (H) 04/09/2022   HDL 61 04/09/2022   LDLCALC 125 (H) 04/09/2022   TRIG 90 04/09/2022   CHOLHDL 3.3 06/21/2021   Lab Results  Component Value Date   VD25OH 36.4 04/09/2022   VD25OH 103.0 (H) 11/16/2021  VD25OH 44.3 06/21/2021   Lab Results  Component Value Date   WBC 6.9 05/31/2020   HGB 14.5 05/31/2020   HCT 43.5 05/31/2020   MCV 91 05/31/2020   PLT 322 05/31/2020   Lab Results  Component Value Date   IRON 57 02/24/2019   FERRITIN 30.7 02/24/2019    Attestation Statements:   Reviewed by clinician on day of visit: allergies, medications, problem list, medical history, surgical history, family history, social history, and previous encounter notes.  I, Jesse Sans, FNP, am acting as Energy manager  for William Hamburger, NP.  I have reviewed the above documentation for accuracy and completeness, and I agree with the above. -  Hanna Aultman d. Lavene Penagos, NP-C

## 2022-06-04 ENCOUNTER — Encounter: Payer: Self-pay | Admitting: Internal Medicine

## 2022-06-04 ENCOUNTER — Ambulatory Visit: Payer: 59 | Admitting: Internal Medicine

## 2022-06-04 VITALS — BP 120/74 | HR 67 | Ht 65.0 in | Wt 182.2 lb

## 2022-06-04 DIAGNOSIS — E041 Nontoxic single thyroid nodule: Secondary | ICD-10-CM

## 2022-06-04 DIAGNOSIS — E039 Hypothyroidism, unspecified: Secondary | ICD-10-CM

## 2022-06-04 DIAGNOSIS — N643 Galactorrhea not associated with childbirth: Secondary | ICD-10-CM | POA: Diagnosis not present

## 2022-06-04 MED ORDER — LEVOTHYROXINE SODIUM 50 MCG PO TABS: 50.0000 ug | ORAL_TABLET | Freq: Every day | ORAL | 3 refills | Status: DC

## 2022-06-04 MED ORDER — LEVOTHYROXINE SODIUM 50 MCG PO TABS: 50.0000 ug | ORAL_TABLET | Freq: Every day | ORAL | 0 refills | Status: DC

## 2022-06-04 NOTE — Progress Notes (Signed)
Patient ID: [name], female   DOB: Sep 25, 1991, 31 y.o.   MRN: [id number]  HPI  [name] is a 31 y.o.-year-old female, initially referred by her PCP, Dr. Selena Leach, returning for follow-up for hypothyroidism, a thyroid nodule and galactorrhea.  She previously saw Dr. Lucianne Leach in our practice, wanting to switch to see a female physician.  Last visit with him was in 09/2020.  Last visit with me 6 months ago.  Interim history: At last visit, she had bilateral breast discharge. Since then, she came off Wellbutrin and noticed that the discharge was localized to the right breast only afterwards. She had a mammogram/ultrasound recently -showed a suspicious mass.  A biopsy was benign. She mentions that the discharge is now minimal and only with stimulation.  She feels that this is improving. She is trying to come off OCPs  - has been off x1 mo. She has some bilateral breast tenderness - mild.  Reviewed history: Patient mentions that she had a probable miscarriage in 2017, after which she was diagnosed with ovarian cysts and also with hypothyroidism.  Hypothyroidism (autoimmune per review of Dr. Remus Leach notes) >> currently on Levothyroxine 50 mcg.  She takes the thyroid hormone: - fasting - with water - separated by >30 min from b'fast  - no calcium, iron, PPIs - just started multivitamins ~1 mo ago - 30 min after LT4!  I reviewed pt's thyroid tests: Lab Results  Component Value Date   TSH 1.720 04/09/2022   TSH 1.84 12/07/2021   TSH 1.660 11/16/2021   TSH 1.68 10/12/2020   TSH 1.710 05/31/2020   TSH 2.27 02/26/2020   TSH 3.29 10/12/2019   TSH 1.35 02/24/2019   TSH 1.57 04/22/2018   TSH 2.64 04/23/2017   FREET4 0.87 12/07/2021   FREET4 1.27 11/16/2021   FREET4 0.74 10/12/2020   FREET4 1.13 05/31/2020   FREET4 0.82 02/26/2020   FREET4 0.70 10/12/2019   FREET4 0.83 02/24/2019   FREET4 0.75 04/22/2018   FREET4 0.61 04/23/2017   FREET4 0.70 02/12/2017   T3FREE 3.5 12/07/2021   T3FREE  3.4 10/12/2019   T3FREE 3.4 11/21/2016   Antithyroid antibodies were elevated: Component     Latest Ref Rng & Units 12/07/2021  Thyroglobulin Ab     < or = 1 IU/mL 5 (H)  Thyroperoxidase Ab SerPl-aCnc     <9 IU/mL 210 (H)   At last visit, we checked a thyroid ultrasound and this showed a thyroid nodule: Thyroid U/S (12/21/2021): Parenchymal Echotexture: Mildly heterogenous Isthmus: 0.4 cm Right lobe: 5.2 x 1.4 x 2.0 cm Left lobe: 5.6 x 1.0 x 1.4 cm _________________________________________________________  Nodule # 1: Location: Isthmus; Inferior Maximum size: 1.5 cm; Other 2 dimensions: 1.2 x 0.9 cm Composition: solid/almost completely solid (2) Echogenicity: isoechoic (1) *Given size (>/= 1.5 - 2.4 cm) and appearance, a follow-up ultrasound in 1 year should be considered based on TI-RADS criteria. _________________________________________________________  Also observed are a few additional scattered subcentimeter isoechoic solid nodules with indiscrete borders, likely representing pseudo nodules. Just posterior to the thyroid capsule in the left upper, right lower, and left lower thyroid are subcentimeter heterogeneously hypodense ovoid structures most compatible with parathyroid glands. No appreciable cervical lymphadenopathy.  IMPRESSION: Solitary isthmus solid thyroid nodule (labeled 1, 1.5 cm) meets criteria (TI-RADS category 3) for 1 year ultrasound surveillance. This finding likely corresponds to the palpable area of concern.      Pt denies feeling nodules in neck, hoarseness, dysphagia/odynophagia.  She has + FH  of thyroid disorders in: father, PGM. No FH of thyroid cancer.  No h/o radiation tx to head or neck. No recent use of iodine supplements.  She describes that she was found to have ovarian cysts in 2017 after a probable spontaneous miscarriage.  Transvaginal ultrasound (12/28/2016): Both ovaries are enlarged with multiple cysts measuring 2-3  cm, separated by thin septations. Differential diagnosis includes polycystic ovarian syndrome, theca lutein cysts, ovarian hyperstimulation, or less likely ovarian mucinous neoplasms. Consider correlation with serum hormone levels and tumor markers, and followup by ultrasound in 8-12 weeks.  She had a follow-up uterine ultrasound (10/16/2017)-Dr. Yalcinkaya's office: Anteverted uterus visualized and appearing within normal limits.  Total AFC was 20 with 3 larger follicles in the right ovary and 2 on the left ovary.  Transvaginal ultrasound (04/29/2019): Unremarkable uterus and LEFT ovary.   Complicated cystic lesion within RIGHT ovary 3.7 x 2.9 x 3.0 cm in size containing septations, 1 of which demonstrates small amount of internal blood flow.   Finding like represents an ovarian neoplasm though this could be benign or malignant.  Laparoscopic exploratory surgery (07/08/2019): Cysts were resected.  No endometriosis.   She has 1 daughter (she was 51) and 1 step son. Had 2 surgical abortions at 53 and 44 y/o.   She was previously started on OCPs for dysmenorrhea, headaches, and severe PMS symptoms-taking them continuously.  Previous testosterone, estrogen, progesterone levels reviewed per records from Dr. April Leach from 10/16/2017 and these were normal  Patient also noticed mild galactorrhea -after starting Wellbutrin for weight loss.  Reviewed prolactin levels: Lab Results  Component Value Date   PROLACTIN 7.1 12/07/2021   No hook effect: Component Ref Range & Units 4 d ago   PROLACTIN,UNDILUTED 2.0 - 30.0 ng/mL 7.1   PROLACTIN,DILUTED ng/mL   Comment: Result confirmed by 1:100 dilution. No high dose  hook effect detected.              Reference Range   Females          Non-pregnant        3.0-30.0          Pregnant           10.0-209.0          Postmenopausal      2.0-20.0  .    MRI of brain and pituitary gland (01/03/2017): No brain or pituitary abnormality.  Patient  was not on Risperdal, Reglan, does not smoke marijuana.  No excessive exercise-does strength exercises 4 times a week.  At last visit I recommended to try to stop Wellbutrin if possible and also to obtain a mammogram, but no intervention was needed.  Mammogram + [name] (03/07/2022): BI-RADS CATEGORY  4: Suspicious. 1. 2.5 cm group of indeterminate UPPER-OUTER RIGHT breast calcifications. Tissue sampling is recommended. 2. Benign RIGHT nipple discharge per history and appearance. 3. No other suspicious mammographic findings noted.  Bx (03/16/2022): PATHOLOGY revealed: Breast, right, needle core biopsy, UOQ- BENIGN FIBROGLANDULAR BREAST TISSUE WITH MILD FIBROCYSTIC CHANGE.- NEGATIVE FOR MALIGNANCY.- MICROCALCIFICATIONS PRESENT.  She has insulin resistance - on metformin per Dr. Dalbert Garnet (Weight management clinic). She recently had a high vitamin D level so her vitamin D supplement is held for now. She had COVID-19 in 06/2021.  She recovered well. She does have a history of microscopic colitis seen on colonoscopy.  ROS: Constitutional: no weight gain/loss, + fatigue, + heat intolerance, + excessive urination + see HPI  Past Medical History:  Diagnosis Date   Fatty liver  Headache    Migraines   History of chicken pox    Hypothyroidism    IBS (irritable bowel syndrome)    Microscopic colitis    PCOS (polycystic ovarian syndrome)    Urinary tract infection    Vertigo    Past Surgical History:  Procedure Laterality Date   LAPAROSCOPIC OVARIAN CYSTECTOMY Bilateral 07/08/2019   Procedure: LAPAROSCOPIC OVARIAN CYSTECTOMY with peritoneal biopsies;  Surgeon: Allie Bossier, MD;  Location: [facility];  Service: Gynecology;  Laterality: Bilateral;   TONSILLECTOMY     adenoidectomy   WISDOM TOOTH EXTRACTION     Social History   Socioeconomic History   Marital status: Married    Spouse name: Vertie Dibbern   Number of children: 1   Years of education: Not on file   Highest  education level: Not on file  Occupational History   Occupation: Starting non profit  Tobacco Use   Smoking status: Never   Smokeless tobacco: Never  Vaping Use   Vaping Use: Never used  Substance and Sexual Activity   Alcohol use: Yes    Alcohol/week: 0.0 standard drinks    Comment: a few times a year   Drug use: No   Sexual activity: Yes    Birth control/protection: Pill  Other Topics Concern   Not on file  Social History Narrative   03/01/21   From: the area   Living: with husband, [name] and daughter   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 - [employer]      Family: daughter - [name] (2014)      Enjoys: exercise, spend time with family      Exercise: strength training - mix of everything - 5 days a week, 1 hour   Diet: low calorie, healthy diet and high protein diet      Safety   Seat belts: Yes    Guns: No   Safe in relationships: Yes    Social Determinants of Corporate investment banker Strain: Not on file  Food Insecurity: Not on file  Transportation Needs: Not on file  Physical Activity: Not on file  Stress: Not on file  Social Connections: Not on file  Intimate Partner Violence: Not on file   Current Outpatient Medications on File Prior to Visit  Medication Sig Dispense Refill   levothyroxine (SYNTHROID) 50 MCG tablet TAKE 1 TABLET BY MOUTH EVERY DAY 90 tablet 0   metFORMIN (GLUCOPHAGE) 500 MG tablet 2 tabs with supper 60 tablet 0   ondansetron (ZOFRAN-ODT) 4 MG disintegrating tablet Take 1 tablet (4 mg total) by mouth every 6 (six) hours as needed for nausea. 20 tablet 0   SRONYX 0.1-20 MG-MCG tablet TAKE 1 TABLET BY MOUTH EVERY DAY (SKIP PLACEBOS) 112 tablet 1   valACYclovir (VALTREX) 1000 MG tablet Take 1 tablet (1,000 mg total) by mouth 2 (two) times daily. 6 tablet 2   No current facility-administered medications on file prior to visit.   Allergies  Allergen Reactions   Erythromycin    Penicillins Hives    Can tolerate cephalosporins    Family History  Problem Relation Age of Onset   Diabetes Maternal Grandmother    Breast cancer Maternal Grandmother 60   Parkinson's disease Maternal Grandmother    Thyroid disease Father    Sleep apnea Mother    Obesity Mother    Kidney disease Paternal Grandmother    Thyroid disease Paternal Grandmother    Healthy Brother    Healthy Daughter  Anesthesia problems Neg Hx     PE: BP 120/74 (BP Location: Right Arm, Patient Position: Sitting, Cuff Size: Normal)   Pulse 67   Ht 5\' 5"  (1.651 m)   Wt 182 lb 3.2 oz (82.6 kg)   SpO2 98%   BMI 30.32 kg/m  Wt Readings from Last 3 Encounters:  06/04/22 182 lb 3.2 oz (82.6 kg)  05/29/22 175 lb (79.4 kg)  05/07/22 179 lb (81.2 kg)   Constitutional: overweight, in NAD Eyes: PERRLA, EOMI, no exophthalmos ENT: moist mucous membranes, no thyromegaly, no cervical lymphadenopathy Cardiovascular: RRR, No MRG Respiratory: CTA B Musculoskeletal: no deformities Skin: moist, warm, no rashes Neurological: no tremor with outstretched hands  ASSESSMENT: 1.  Hashimoto's hypothyroidism  2.  Isthmic thyroid nodule  3. Galactorrhea  4. Ovarian cysts  PLAN:  1. Patient with l longstanding hypothyroidism, diagnosed as 2/2 Hashimoto's thyroiditis at last visit, on low-dose levothyroxine therapy with 50 mcg levothyroxine daily. - latest thyroid labs reviewed with pt. >> normal: Lab Results  Component Value Date   TSH 1.720 04/09/2022  - she continues on LT4 50 mcg daily - pt feels good on this dose. - we discussed about taking the thyroid hormone every day, with water, >30 minutes before breakfast, separated by >4 hours from acid reflux medications, calcium, iron, multivitamins. Pt. was taking it correctly at last visit, but a month ago she added multivitamins only 30 minutes after levothyroxine.  I advised her to move this at least 4 hours after. -I will see her back in a year  2.  Isthmic thyroid nodule -Diagnosed on ultrasound  obtained after last visit -No neck compression symptoms -Measuring 1.5 x 1.2 x 0.9 cm, isoechoic, overall low risk -A follow-up ultrasound was recommended in a year.  We will plan to obtain this in 6 months.  3. Galactorrhea -Reviewed results of her pituitary MRI from 2018 and this did not show tumor -Her mild, stimulated, bilateral, galactorrhea started after starting Wellbutrin for help with weight loss.  She is also on OCPs.  Both of these can cause a mild increase in prolactin.  She already came off Wellbutrin after which the discharge localized in the right breast.  A month ago she also stopped OCPs.  -At last visit, however, we checked a prolactin level needed and diluted and these were normal. -We discussed at that time about possibly stopping Wellbutrin and I also recommended a mammogram, but otherwise no intervention was needed. She had a mammogram that showed a suspicious mass in R breast >> Bx'ed  - benign. -The discharge has now become scant and only with significant stimulation.  This is not bothersome for her.  4. Ovarian cysts -Luteal cyst per review of pathology report from her laparoscopic site -She does not have hirsutism or acne -She has dysmenorrhea and PMS if she does not take OCPs -testosterone normal per review of Dr. Lyndal RainbowYalcinkaya's labs from 2018. [facility], as were [facility]s -Suspicion for PCOS is not high -She has no desire for pregnancy for now.  Carlus Pavlovristina Dorothe Elmore, MD PhD [facility]

## 2022-06-04 NOTE — Patient Instructions (Addendum)
Please continue Levothyroxine 50 mcg daily.  Take the thyroid hormone every day, with water, at least 30 minutes before breakfast, separated by at least 4 hours from: - acid reflux medications - calcium - iron - multivitamins  Move Multivitamins at least 4h after Levothyroxine.  Please return in 1 year, but send me a message in 11/2022 to order another thyroid U/S.

## 2022-06-05 ENCOUNTER — Other Ambulatory Visit (HOSPITAL_COMMUNITY)
Admission: RE | Admit: 2022-06-05 | Discharge: 2022-06-05 | Disposition: A | Discharge status: Home / Self Care | Payer: 59 | Source: Ambulatory Visit | Attending: Obstetrics and Gynecology | Admitting: Obstetrics and Gynecology

## 2022-06-05 ENCOUNTER — Ambulatory Visit (INDEPENDENT_AMBULATORY_CARE_PROVIDER_SITE_OTHER): Payer: 59

## 2022-06-05 VITALS — BP 121/79 | HR 87

## 2022-06-05 DIAGNOSIS — A599 Trichomoniasis, unspecified: Secondary | ICD-10-CM | POA: Diagnosis present

## 2022-06-05 NOTE — Progress Notes (Signed)
SUBJECTIVE:  31 y.o. female who desires a STI screen. Denies abnormal vaginal discharge, bleeding or significant pelvic pain. No UTI symptoms. Denies history of known exposure to STD.  Patient's last menstrual period was 05/13/2022 (exact date).  OBJECTIVE:  She appears well.   ASSESSMENT:  STI Screen   PLAN:  Pt offered STI blood screening-not indicated GC, chlamydia, and trichomonas probe sent to lab. I asked if wanted BV and yeast added pt desires BV and yeast testing as well. Treatment: To be determined once lab results are received.  Pt follow up as needed.

## 2022-06-06 LAB — CERVICOVAGINAL ANCILLARY ONLY
Bacterial Vaginitis (gardnerella): NEGATIVE
Candida Glabrata: NEGATIVE
Candida Vaginitis: NEGATIVE
Chlamydia: NEGATIVE
Comment: NEGATIVE
Comment: NEGATIVE
Comment: NEGATIVE
Comment: NEGATIVE
Comment: NEGATIVE
Comment: NORMAL
Neisseria Gonorrhea: NEGATIVE
Trichomonas: NEGATIVE

## 2022-06-18 ENCOUNTER — Ambulatory Visit (INDEPENDENT_AMBULATORY_CARE_PROVIDER_SITE_OTHER): Payer: 59 | Admitting: Nurse Practitioner

## 2022-06-18 ENCOUNTER — Encounter (INDEPENDENT_AMBULATORY_CARE_PROVIDER_SITE_OTHER): Payer: Self-pay | Admitting: Nurse Practitioner

## 2022-06-18 VITALS — BP 116/79 | HR 72 | Temp 97.9°F | Ht 65.0 in | Wt 176.0 lb

## 2022-06-18 DIAGNOSIS — E669 Obesity, unspecified: Secondary | ICD-10-CM | POA: Diagnosis not present

## 2022-06-18 DIAGNOSIS — Z6829 Body mass index (BMI) 29.0-29.9, adult: Secondary | ICD-10-CM | POA: Diagnosis not present

## 2022-06-18 DIAGNOSIS — E8881 Metabolic syndrome: Secondary | ICD-10-CM | POA: Diagnosis not present

## 2022-06-18 DIAGNOSIS — Z7984 Long term (current) use of oral hypoglycemic drugs: Secondary | ICD-10-CM

## 2022-06-18 MED ORDER — METFORMIN HCL 500 MG PO TABS: ORAL_TABLET | ORAL | 0 refills | Status: DC

## 2022-06-19 NOTE — Progress Notes (Signed)
Chief Complaint:   OBESITY [name] is here to discuss her progress with her obesity treatment plan along with follow-up of her obesity related diagnoses. [name] is on keeping a food journal and adhering to recommended goals of 1250 calories and 90-100 grams of protein and states she is following her eating plan approximately 70-75% of the time. [name] states she is doing strength/cardio 60 minutes 5 times per week.  Today's visit was #: 35 Starting weight: 199 lbs Starting date: 05/31/2020 Today's weight: 176 lbs Today's date: 06/18/2022 Total lbs lost to date: 23 lbs Total lbs lost since last in-office visit: 0  Interim History: [name] has had a few celebrations since last visit. She does well with breakfast and lunch but struggles with dinner. Tends to not make the best choice at dinner. She works part time at [employer]. Plans to start working out with a personal trainer 3 days per week.  Subjective:   1. Insulin resistance [name] is currently taking Metformin 500 mg, 2 tablets at dinner. Denies any side effects. Also denies hunger or cravings.  Assessment/Plan:   1. Insulin resistance We will refill Metformin 500 mg, 2 tabs at dinner for 1 month with 0 refills. Side effects discussed. We will check labs in Aug.  [name] will continue to work on weight loss, exercise, and decreasing simple carbohydrates to help decrease the risk of diabetes. [name] agreed to follow-up with [name] as directed to closely monitor her progress.   -Refill metFORMIN (GLUCOPHAGE) 500 MG tablet; 2 tabs with supper  Dispense: 60 tablet; Refill: 0  2. Obesity with current BMI of 29.4 [name] is currently in the action stage of change. As such, her goal is to continue with weight loss efforts. She has agreed to keeping a food journal and adhering to recommended goals of 1250 calories and 90-100 grams of protein.   Exercise goals: As is.  We will check IC and labs in Aug.  Behavioral modification strategies:  increasing lean protein intake, increasing water intake, and planning for success.  [name] has agreed to follow-up with our clinic in 4 weeks. She was informed of the importance of frequent follow-up visits to maximize her success with intensive lifestyle modifications for her multiple health conditions.   Objective:   Blood pressure 116/79, pulse 72, temperature 97.9 F (36.6 C), height 5\' 5"  (1.651 m), weight 176 lb (79.8 kg), last menstrual period 05/13/2022, SpO2 97 %. Body mass index is 29.29 kg/m.  General: Cooperative, alert, well developed, in no acute distress. HEENT: Conjunctivae and lids unremarkable. Cardiovascular: Regular rhythm.  Lungs: Normal work of breathing. Neurologic: No focal deficits.   Lab Results  Component Value Date   CREATININE 0.89 04/09/2022   BUN 11 04/09/2022   NA 140 04/09/2022   K 4.4 04/09/2022   CL 102 04/09/2022   CO2 24 04/09/2022   Lab Results  Component Value Date   ALT 30 04/09/2022   AST 23 04/09/2022   ALKPHOS 76 04/09/2022   BILITOT 0.3 04/09/2022   Lab Results  Component Value Date   HGBA1C 5.1 04/09/2022   HGBA1C 5.0 11/16/2021   HGBA1C 5.3 06/21/2021   HGBA1C 5.0 02/21/2021   HGBA1C 5.0 05/31/2020   Lab Results  Component Value Date   INSULIN 7.5 04/09/2022   INSULIN 11.2 11/16/2021   INSULIN 10.6 06/21/2021   INSULIN 14.9 02/21/2021   INSULIN 17.8 10/24/2020   Lab Results  Component Value Date   TSH 1.720 04/09/2022   Lab  Results  Component Value Date   CHOL 202 (H) 04/09/2022   HDL 61 04/09/2022   LDLCALC 125 (H) 04/09/2022   TRIG 90 04/09/2022   CHOLHDL 3.3 06/21/2021   Lab Results  Component Value Date   VD25OH 36.4 04/09/2022   VD25OH 103.0 (H) 11/16/2021   VD25OH 44.3 06/21/2021   Lab Results  Component Value Date   WBC 6.9 05/31/2020   HGB 14.5 05/31/2020   HCT 43.5 05/31/2020   MCV 91 05/31/2020   PLT 322 05/31/2020   Lab Results  Component Value Date   IRON 57 02/24/2019   FERRITIN  30.7 02/24/2019   Attestation Statements:   Reviewed by clinician on day of visit: allergies, medications, problem list, medical history, surgical history, family history, social history, and previous encounter notes.  Time spent on visit including pre-visit chart review and post-visit care and charting was 30 minutes.   I, Brendell Tyus, RMA, am acting as transcriptionist for Irene Limbo, FNP..  I have reviewed the above documentation for accuracy and completeness, and I agree with the above. Irene Limbo, FNP

## 2022-07-09 ENCOUNTER — Ambulatory Visit (INDEPENDENT_AMBULATORY_CARE_PROVIDER_SITE_OTHER): Payer: 59 | Admitting: Nurse Practitioner

## 2022-07-09 ENCOUNTER — Other Ambulatory Visit (INDEPENDENT_AMBULATORY_CARE_PROVIDER_SITE_OTHER): Payer: Self-pay | Admitting: Nurse Practitioner

## 2022-07-09 ENCOUNTER — Encounter (INDEPENDENT_AMBULATORY_CARE_PROVIDER_SITE_OTHER): Payer: Self-pay | Admitting: Nurse Practitioner

## 2022-07-09 VITALS — BP 108/71 | HR 71 | Temp 98.0°F | Ht 65.0 in | Wt 180.0 lb

## 2022-07-09 DIAGNOSIS — F3289 Other specified depressive episodes: Secondary | ICD-10-CM | POA: Diagnosis not present

## 2022-07-09 DIAGNOSIS — Z6830 Body mass index (BMI) 30.0-30.9, adult: Secondary | ICD-10-CM | POA: Diagnosis not present

## 2022-07-09 DIAGNOSIS — E669 Obesity, unspecified: Secondary | ICD-10-CM | POA: Diagnosis not present

## 2022-07-09 MED ORDER — BUPROPION HCL ER (SR) 150 MG PO TB12: 150.0000 mg | ORAL_TABLET | Freq: Every day | ORAL | 0 refills | Status: DC

## 2022-07-09 NOTE — Progress Notes (Signed)
Chief Complaint:   OBESITY [name] is here to discuss her progress with her obesity treatment plan along with follow-up of her obesity related diagnoses. [name] is on keeping a food journal and adhering to recommended goals of 1250 calories and 90-100 grams of protein and states she is following her eating plan approximately 50% of the time. [name] states she is strength training and cardio 60 minutes 5 times per week.  Today's visit was #: 36 Starting weight: 199 lbs Starting date: 05/31/2020 Today's weight: 180 lbs Today's date: 07/09/2022 Total lbs lost to date: 19 lbs Total lbs lost since last in-office visit: 0  Interim History: [name] has celebrated the 4th of July and has continued to celebrate since. She plans to start meal planning. She struggles with cost of food. She is not drinking enough water and has been drinking more soda. She is struggling with cravings and hunger. She restarted Wellbutrin 2 weeks ago.   Subjective:   1. Other depression with emotional eating [name] has restarted Wellbutrin SR 150 mg 2 weeks ago. She stopped taking it in Dec due to possible side effects. Denies any current side effects.  Assessment/Plan:   1. Other depression with emotional eating We will refill Wellbutrin SR 150 mg daily for 1 month with 0 refills.  Side effects discussed.   -Refill buPROPion (WELLBUTRIN SR) 150 MG 12 hr tablet; Take 1 tablet (150 mg total) by mouth daily.  Dispense: 30 tablet; Refill: 0  2. Obesity with current BMI of 30.0 [name] is currently in the action stage of change. As such, her goal is to continue with weight loss efforts. She has agreed to keeping a food journal and adhering to recommended goals of 1250 calories and 90-100 grams of protein.   Exercise goals: As is.  Behavioral modification strategies: increasing water intake, no skipping meals, and planning for success.  [name] has agreed to follow-up with our clinic in 3 weeks. She was informed of the  importance of frequent follow-up visits to maximize her success with intensive lifestyle modifications for her multiple health conditions.   Objective:   Blood pressure 108/71, pulse 71, temperature 98 F (36.7 C), height 5\' 5"  (1.651 m), weight 180 lb (81.6 kg), SpO2 99 %. Body mass index is 29.95 kg/m.  General: Cooperative, alert, well developed, in no acute distress. HEENT: Conjunctivae and lids unremarkable. Cardiovascular: Regular rhythm.  Lungs: Normal work of breathing. Neurologic: No focal deficits.   Lab Results  Component Value Date   CREATININE 0.89 04/09/2022   BUN 11 04/09/2022   NA 140 04/09/2022   K 4.4 04/09/2022   CL 102 04/09/2022   CO2 24 04/09/2022   Lab Results  Component Value Date   ALT 30 04/09/2022   AST 23 04/09/2022   ALKPHOS 76 04/09/2022   BILITOT 0.3 04/09/2022   Lab Results  Component Value Date   HGBA1C 5.1 04/09/2022   HGBA1C 5.0 11/16/2021   HGBA1C 5.3 06/21/2021   HGBA1C 5.0 02/21/2021   HGBA1C 5.0 05/31/2020   Lab Results  Component Value Date   INSULIN 7.5 04/09/2022   INSULIN 11.2 11/16/2021   INSULIN 10.6 06/21/2021   INSULIN 14.9 02/21/2021   INSULIN 17.8 10/24/2020   Lab Results  Component Value Date   TSH 1.720 04/09/2022   Lab Results  Component Value Date   CHOL 202 (H) 04/09/2022   HDL 61 04/09/2022   LDLCALC 125 (H) 04/09/2022   TRIG 90 04/09/2022   CHOLHDL 3.3  06/21/2021   Lab Results  Component Value Date   VD25OH 36.4 04/09/2022   VD25OH 103.0 (H) 11/16/2021   VD25OH 44.3 06/21/2021   Lab Results  Component Value Date   WBC 6.9 05/31/2020   HGB 14.5 05/31/2020   HCT 43.5 05/31/2020   MCV 91 05/31/2020   PLT 322 05/31/2020   Lab Results  Component Value Date   IRON 57 02/24/2019   FERRITIN 30.7 02/24/2019   Attestation Statements:   Reviewed by clinician on day of visit: allergies, medications, problem list, medical history, surgical history, family history, social history, and previous  encounter notes.  I, Brendell Tyus, RMA, am acting as transcriptionist for Irene Limbo, FNP.  I have reviewed the above documentation for accuracy and completeness, and I agree with the above. Irene Limbo, FNP

## 2022-07-30 ENCOUNTER — Encounter (INDEPENDENT_AMBULATORY_CARE_PROVIDER_SITE_OTHER): Payer: Self-pay | Admitting: Nurse Practitioner

## 2022-07-30 ENCOUNTER — Ambulatory Visit (INDEPENDENT_AMBULATORY_CARE_PROVIDER_SITE_OTHER): Payer: 59 | Admitting: Nurse Practitioner

## 2022-07-30 VITALS — BP 112/78 | HR 66 | Temp 98.1°F | Ht 65.0 in | Wt 179.0 lb

## 2022-07-30 DIAGNOSIS — E669 Obesity, unspecified: Secondary | ICD-10-CM

## 2022-07-30 DIAGNOSIS — E673 Hypervitaminosis D: Secondary | ICD-10-CM

## 2022-07-30 DIAGNOSIS — Z6829 Body mass index (BMI) 29.0-29.9, adult: Secondary | ICD-10-CM

## 2022-07-30 DIAGNOSIS — G629 Polyneuropathy, unspecified: Secondary | ICD-10-CM

## 2022-07-30 DIAGNOSIS — R0602 Shortness of breath: Secondary | ICD-10-CM | POA: Diagnosis not present

## 2022-07-31 LAB — COMPREHENSIVE METABOLIC PANEL
ALT: 31 IU/L (ref 0–32)
AST: 22 IU/L (ref 0–40)
Albumin/Globulin Ratio: 1.6 (ref 1.2–2.2)
Albumin: 4.2 g/dL (ref 3.9–4.9)
Alkaline Phosphatase: 80 IU/L (ref 44–121)
BUN/Creatinine Ratio: 16 (ref 9–23)
BUN: 14 mg/dL (ref 6–20)
Bilirubin Total: <0.2 mg/dL (ref 0.0–1.2)
CO2: 26 mmol/L (ref 20–29)
Calcium: 9.2 mg/dL (ref 8.7–10.2)
Chloride: 101 mmol/L (ref 96–106)
Creatinine, Ser: 0.86 mg/dL (ref 0.57–1.00)
Globulin, Total: 2.6 g/dL (ref 1.5–4.5)
Glucose: 80 mg/dL (ref 70–99)
Potassium: 4.4 mmol/L (ref 3.5–5.2)
Sodium: 140 mmol/L (ref 134–144)
Total Protein: 6.8 g/dL (ref 6.0–8.5)
eGFR: 91 mL/min/{1.73_m2} (ref >59)

## 2022-07-31 LAB — CBC WITH DIFFERENTIAL/PLATELET
Basophils Absolute: 0 10*3/uL (ref 0.0–0.2)
Basos: 1 %
EOS (ABSOLUTE): 0.1 10*3/uL (ref 0.0–0.4)
Eos: 2 %
Hematocrit: 40.1 % (ref 34.0–46.6)
Hemoglobin: 13.5 g/dL (ref 11.1–15.9)
Immature Grans (Abs): 0 10*3/uL (ref 0.0–0.1)
Immature Granulocytes: 0 %
Lymphocytes Absolute: 1.8 10*3/uL (ref 0.7–3.1)
Lymphs: 30 %
MCH: 29.7 pg (ref 26.6–33.0)
MCHC: 33.7 g/dL (ref 31.5–35.7)
MCV: 88 fL (ref 79–97)
Monocytes Absolute: 0.4 10*3/uL (ref 0.1–0.9)
Monocytes: 6 %
Neutrophils Absolute: 3.8 10*3/uL (ref 1.4–7.0)
Neutrophils: 61 %
Platelets: 279 10*3/uL (ref 150–450)
RBC: 4.55 x10E6/uL (ref 3.77–5.28)
RDW: 11.9 % (ref 11.7–15.4)
WBC: 6.2 10*3/uL (ref 3.4–10.8)

## 2022-07-31 LAB — TSH: TSH: 1.42 u[IU]/mL (ref 0.450–4.500)

## 2022-07-31 LAB — VITAMIN D 25 HYDROXY (VIT D DEFICIENCY, FRACTURES): Vit D, 25-Hydroxy: 47.4 ng/mL (ref 30.0–100.0)

## 2022-07-31 LAB — VITAMIN B12: Vitamin B-12: 1072 pg/mL (ref 232–1245)

## 2022-08-01 NOTE — Progress Notes (Signed)
Chief Complaint:   OBESITY [name] is here to discuss her progress with her obesity treatment plan along with follow-up of her obesity related diagnoses. [name] is on keeping a food journal and adhering to recommended goals of 1250 calories and 90-100 grams of protein and states she is following her eating plan approximately 50% of the time. [name] states she is strength traing cardio 60 minutes 3 times per week.  Today's visit was #: 37 Starting weight: 199 lbs Starting date: 05/31/2020 Today's weight: 179 lbs Today's date: 07/30/2022 Total lbs lost to date: 20 lbs Total lbs lost since last in-office visit: 1  Interim History: [name] has overall done well with weight loss. Struggling with plateauing since May. Lately at a mental struggle. Getting burned out. Some financial concerns. She has been working out at [employer] and with a Systems analyst. Struggling with drinking enough water.  Subjective:   1. Shortness of breath on exertion Kassidy's last IC RMR 08/02/21 at 1605. Today's RMR at 1714.  2. Neuropathy Enis's numbness and tingling in arms started as a teenage. It has gotten worse over the years especially last 6 months. Some visual change and dizziness when changing positions. "Feels like  my body is falling asleep, like I'm going to pass out".  3. High vitamin D level Katisha is taking multivitamin, no over the counter Vit D.  Assessment/Plan:   1. Shortness of breath on exertion We will obtain labs today. RMR Improved. To continue working on exercise to increase shortness of breath with exerction.    - CBC with Differential/Platelet - Comprehensive metabolic panel - TSH  2. Neuropathy We will obtain labs today. We will refer [name] to neurology.  - CBC with Differential/Platelet - Comprehensive metabolic panel - Vitamin B12 - TSH - Ambulatory referral to Neurology  3. High vitamin D level We will obtain labs today.  - VITAMIN D 25 Hydroxy (Vit-D Deficiency,  Fractures) - TSH  4. Obesity with current BMI of 29.8 We will obtain labs today.  - TSH  [name] is currently in the action stage of change. As such, her goal is to continue with weight loss efforts. She has agreed to keeping a food journal and adhering to recommended goals of 1200-1300 calories and 90+ grams of protein.   Exercise goals: As is.  Behavioral modification strategies: increasing lean protein intake, increasing water intake, and planning for success.  [name] has agreed to follow-up with our clinic in 3 weeks. She was informed of the importance of frequent follow-up visits to maximize her success with intensive lifestyle modifications for her multiple health conditions.   [name] was informed we would discuss her lab results at her next visit unless there is a critical issue that needs to be addressed sooner. [name] agreed to keep her next visit at the agreed upon time to discuss these results.  Objective:   Blood pressure 112/78, pulse 66, temperature 98.1 F (36.7 C), height 5\' 5"  (1.651 m), weight 179 lb (81.2 kg), SpO2 98 %. Body mass index is 29.79 kg/m.  General: Cooperative, alert, well developed, in no acute distress. HEENT: Conjunctivae and lids unremarkable. Cardiovascular: Regular rhythm.  Lungs: Normal work of breathing. Neurologic: No focal deficits.   Lab Results  Component Value Date   CREATININE 0.86 07/30/2022   BUN 14 07/30/2022   NA 140 07/30/2022   K 4.4 07/30/2022   CL 101 07/30/2022   CO2 26 07/30/2022   Lab Results  Component Value Date  ALT 31 07/30/2022   AST 22 07/30/2022   ALKPHOS 80 07/30/2022   BILITOT <0.2 07/30/2022   Lab Results  Component Value Date   HGBA1C 5.1 04/09/2022   HGBA1C 5.0 11/16/2021   HGBA1C 5.3 06/21/2021   HGBA1C 5.0 02/21/2021   HGBA1C 5.0 05/31/2020   Lab Results  Component Value Date   INSULIN 7.5 04/09/2022   INSULIN 11.2 11/16/2021   INSULIN 10.6 06/21/2021   INSULIN 14.9 02/21/2021   INSULIN  17.8 10/24/2020   Lab Results  Component Value Date   TSH 1.420 07/30/2022   Lab Results  Component Value Date   CHOL 202 (H) 04/09/2022   HDL 61 04/09/2022   LDLCALC 125 (H) 04/09/2022   TRIG 90 04/09/2022   CHOLHDL 3.3 06/21/2021   Lab Results  Component Value Date   VD25OH 47.4 07/30/2022   VD25OH 36.4 04/09/2022   VD25OH 103.0 (H) 11/16/2021   Lab Results  Component Value Date   WBC 6.2 07/30/2022   HGB 13.5 07/30/2022   HCT 40.1 07/30/2022   MCV 88 07/30/2022   PLT 279 07/30/2022   Lab Results  Component Value Date   IRON 57 02/24/2019   FERRITIN 30.7 02/24/2019   Attestation Statements:   Reviewed by clinician on day of visit: allergies, medications, problem list, medical history, surgical history, family history, social history, and previous encounter notes.  I, Brendell Tyus, RMA, am acting as transcriptionist for Irene Limbo, FNP.  I have reviewed the above documentation for accuracy and completeness, and I agree with the above. Irene Limbo, FNP

## 2022-08-08 ENCOUNTER — Encounter (INDEPENDENT_AMBULATORY_CARE_PROVIDER_SITE_OTHER): Payer: Self-pay

## 2022-08-14 ENCOUNTER — Other Ambulatory Visit: Payer: Self-pay | Admitting: Obstetrics & Gynecology

## 2022-08-27 ENCOUNTER — Encounter (INDEPENDENT_AMBULATORY_CARE_PROVIDER_SITE_OTHER): Payer: Self-pay | Admitting: Nurse Practitioner

## 2022-08-27 ENCOUNTER — Ambulatory Visit (INDEPENDENT_AMBULATORY_CARE_PROVIDER_SITE_OTHER): Payer: 59 | Admitting: Nurse Practitioner

## 2022-08-27 VITALS — BP 112/76 | HR 72 | Temp 98.2°F | Ht 65.0 in | Wt 177.0 lb

## 2022-08-27 DIAGNOSIS — E669 Obesity, unspecified: Secondary | ICD-10-CM | POA: Diagnosis not present

## 2022-08-27 DIAGNOSIS — E88819 Insulin resistance, unspecified: Secondary | ICD-10-CM

## 2022-08-27 DIAGNOSIS — Z6829 Body mass index (BMI) 29.0-29.9, adult: Secondary | ICD-10-CM

## 2022-08-27 DIAGNOSIS — Z6833 Body mass index (BMI) 33.0-33.9, adult: Secondary | ICD-10-CM

## 2022-08-27 DIAGNOSIS — E8881 Metabolic syndrome: Secondary | ICD-10-CM

## 2022-09-03 NOTE — Progress Notes (Signed)
Chief Complaint:   OBESITY [name] is here to discuss [name] progress with [name] obesity treatment plan along with follow-up of [name] obesity related diagnoses. Eric is on keeping a food journal and adhering to recommended goals of 1200-1300 calories and 90+ protein and states [name] is following [name] eating plan approximately 50% of the time. Starlena states [name] is doing strength training 60 minutes 5 times per week.  Today's visit was #: 38 Starting weight: 199 lbs Starting date: 05/31/2020 Today's weight: 177 lbs Today's date: 08/27/2022 Total lbs lost to date: 22 lbs Total lbs lost since last in-office visit: 2 lbs  Interim History: Has done well with weight loss. [name] went to [place] since [name] last visit.  Has been meal prepping and not eating out.  Drinking water and an occasional soda.  Feels good and is doing well.    Subjective:   1. Insulin resistance Taking metformin 500 mg, 2 tablets at bedtime.  Denies side effects.    Assessment/Plan:   1. Insulin resistance Continue metformin as directed.   Brittini will continue to work on weight loss, exercise, and decreasing simple carbohydrates to help decrease the risk of diabetes. Derinda agreed to follow-up with [name] as directed to closely monitor [name] progress.   2. Obesity with current BMI of 29.5 Reviewed labs with patient.   Jaynia is currently in the action stage of change. As such, [name] goal is to continue with weight loss efforts. [name] has agreed to keeping a food journal and adhering to recommended goals of 1200-1300 calories and 90+ protein.   Exercise goals:  as is.  Behavioral modification strategies: increasing lean protein intake, increasing vegetables, and increasing water intake.  Shaden has agreed to follow-up with our clinic in 4 weeks. [name] was informed of the importance of frequent follow-up visits to maximize [name] success with intensive lifestyle modifications for [name] multiple health conditions.   Objective:   Blood pressure  112/76, pulse 72, temperature 98.2 F (36.8 C), height 5\' 5"  (1.651 m), weight 177 lb (80.3 kg), SpO2 98 %. Body mass index is 29.45 kg/m.  General: Cooperative, alert, well developed, in no acute distress. HEENT: Conjunctivae and lids unremarkable. Cardiovascular: Regular rhythm.  Lungs: Normal work of breathing. Neurologic: No focal deficits.   Lab Results  Component Value Date   CREATININE 0.86 07/30/2022   BUN 14 07/30/2022   NA 140 07/30/2022   K 4.4 07/30/2022   CL 101 07/30/2022   CO2 26 07/30/2022   Lab Results  Component Value Date   ALT 31 07/30/2022   AST 22 07/30/2022   ALKPHOS 80 07/30/2022   BILITOT <0.2 07/30/2022   Lab Results  Component Value Date   HGBA1C 5.1 04/09/2022   HGBA1C 5.0 11/16/2021   HGBA1C 5.3 06/21/2021   HGBA1C 5.0 02/21/2021   HGBA1C 5.0 05/31/2020   Lab Results  Component Value Date   INSULIN 7.5 04/09/2022   INSULIN 11.2 11/16/2021   INSULIN 10.6 06/21/2021   INSULIN 14.9 02/21/2021   INSULIN 17.8 10/24/2020   Lab Results  Component Value Date   TSH 1.420 07/30/2022   Lab Results  Component Value Date   CHOL 202 (H) 04/09/2022   HDL 61 04/09/2022   LDLCALC 125 (H) 04/09/2022   TRIG 90 04/09/2022   CHOLHDL 3.3 06/21/2021   Lab Results  Component Value Date   VD25OH 47.4 07/30/2022   VD25OH 36.4 04/09/2022   VD25OH 103.0 (H) 11/16/2021   Lab Results  Component  Value Date   WBC 6.2 07/30/2022   HGB 13.5 07/30/2022   HCT 40.1 07/30/2022   MCV 88 07/30/2022   PLT 279 07/30/2022   Lab Results  Component Value Date   IRON 57 02/24/2019   FERRITIN 30.7 02/24/2019    Attestation Statements:   Reviewed by clinician on day of visit: allergies, medications, problem list, medical history, surgical history, family history, social history, and previous encounter notes.  I spent 30 minutes with the patient and with reviewing [name] chart before and after visit.   I, Malcolm Metro, RMA, am acting as transcriptionist for  Irene Limbo, FNP  I have reviewed the above documentation for accuracy and completeness, and I agree with the above. Irene Limbo, FNP

## 2022-09-11 ENCOUNTER — Encounter: Payer: Self-pay | Admitting: Neurology

## 2022-09-11 ENCOUNTER — Ambulatory Visit: Payer: 59 | Admitting: Neurology

## 2022-09-11 VITALS — BP 148/80 | HR 70 | Ht 66.0 in | Wt 182.8 lb

## 2022-09-11 DIAGNOSIS — R202 Paresthesia of skin: Secondary | ICD-10-CM

## 2022-09-11 DIAGNOSIS — R29898 Other symptoms and signs involving the musculoskeletal system: Secondary | ICD-10-CM | POA: Diagnosis not present

## 2022-09-11 DIAGNOSIS — R200 Anesthesia of skin: Secondary | ICD-10-CM

## 2022-09-11 DIAGNOSIS — M5432 Sciatica, left side: Secondary | ICD-10-CM

## 2022-09-11 DIAGNOSIS — R55 Syncope and collapse: Secondary | ICD-10-CM | POA: Diagnosis not present

## 2022-09-11 NOTE — Progress Notes (Unsigned)
[facility]    Provider:  Dr Jaynee Eagles Requesting Provider: Everardo Pacific, * Primary Care Provider:  Lesleigh Noe, MD  CC:  Arm numbness, leg numbness  HPI:  [name] is a 31 y.o. female here as requested by Everardo Pacific, * for neuropathy. PMHx migraine, IBS, acquired autoimmune hypothyroidism, history of polycystic ovaries, insulin resistance, depression, obesity, hyperlipidemia, neck pain, vitamin D deficiency, fatty liver, vertigo. She went to a neurologist once, Dr. Posey Pronto. From the time she was a teenager she would have "moments" when she would reach up and her left arm would (more than right) would feel weak in the left arm, about 15 years ago, had a normal MRI in 2018, only when reaching up and and briefly on the way back down, would get some tingling all the way down the arm to the fingers and she would drop a glass sometimes, positionally. This happened prior to having her daughter. After having daughter and epidural feel like in her leg, in 2016 she was bit by a tick and was evaluated by [facility] for this and for a tick-borne illness. But the issue today is really that she stand up and feel faint and lightheaded and almost pass out and feel like tingling in the legs and arm. Never passed out and the symptoms in the legs and arm only happen with the pre-syncope and no other times, not happening. Since then MRI cervical spine (saw Dr. Posey Pronto 2017) was normal, in 2018 MRI of the brain 2018 was normal and MRI, She saw neurology Dr. Posey Pronto  2017 who evaluated her for her left arm pain. She saw [facility] who evaluated her for the low back and leg issues. Dr. Posey Pronto in neurology ordered an MRI of the cervical spine in 2017 normal, in 2015-2016 saw [facility] for low back pain with sciatica and she went to PT for pelvic therapy and was examined for tick bite I do not see an MRi lumbar spine. She is still having the left arm numbness and tingling when changing positions in bed and  inconvenient in sex. The leg is a little weakness and get numbness and tingling down the leg, worse with getting out of the car and rolling over on it and presyncope when standing up. Cramps in the left toes. No other.  Reviewed notes, labs and imaging from outside physicians, which showed:  She saw neurology Dr. Posey Pronto  2017 who evaluated her for her left arm pain. She saw [facility] who evaluated her for the low back and leg issues. Dr. Posey Pronto in neurology ordered an MRI of the cervical spine in 2017 normal, in 2015-2016 saw [facility] for low back pain with sciatica and she went to PT for pelvic therapy and was examined for tick bite I do not see an MRi lumbar spine.   [facility], last seen July 30, 2022 and referred by Colletta Maryland took her half FNP, I reviewed her notes, she has numbness and tingling in the arm starting as a teenager, its gotten worse over the years especially the last 6 months, some visual change and dizziness when changing positions, "feels like my body is falling asleep, like I am going to pass out".  Medication such as CBC, CMP, B12, TSH were checked.  I reviewed her notes from [facility] in 2016 she was seen for low back pain.  MRI cervical spine 04/25/2016: normal - reviewed images and agree  Disc levels:   Discs: Mild  disc desiccation at C2-3, C3-4, C4-5 and C5-6.   C2-3: No significant disc bulge. No neural foraminal stenosis. No central canal stenosis.   C3-4: No significant disc bulge. No neural foraminal stenosis. No central canal stenosis.   C4-5: No significant disc bulge. No neural foraminal stenosis. No central canal stenosis.   C5-6: No significant disc bulge. No neural foraminal stenosis. No central canal stenosis.   C6-7: No significant disc bulge. No neural foraminal stenosis. No central canal stenosis.   C7-T1: No significant disc bulge. No neural foraminal stenosis. No central canal stenosis.   IMPRESSION: 1. No  significant cervical spine disc protrusion, foraminal stenosis or central canal stenosis. 2. Normal cervical spinal cord.  MRI brain 01/03/2017: .   FINDINGS: Brain: There is no evidence of acute infarct, intracranial hemorrhage, mass, midline shift, or extra-axial fluid collection. The ventricles and sulci are normal. The brain is normal in signal. No abnormal enhancement is identified.   Dedicated imaging was performed through the sella turcica to evaluate the pituitary gland. The pituitary gland is overall normal in size and morphology. There is a normal posterior pituitary bright spot. Pituitary enhancement is homogeneous without a sellar or suprasellar lesion identified. The infundibulum is midline. The cavernous sinuses and optic chiasm are unremarkable.   Vascular: Major intracranial vascular flow voids are preserved.   Skull and upper cervical spine: Unremarkable bone marrow signal.   Sinuses/Orbits: Unremarkable orbits. Minimal scattered paranasal sinus mucosal thickening and trace left mastoid fluid.   Other: None.   IMPRESSION: Unremarkable appearance of the brain and pituitary.      From a thorough review of records, medications tried that can be used in migraine management include: Tylenol, Wellbutrin, Stadol, Flexeril, Topamax, Decadron injections, Benadryl, ibuprofen, ketorolac oral and injections, Mobic, Robaxin, Reglan injections, Zofran injections and oral,  Review of Systems: Patient complains of symptoms per HPI as well as the following symptoms numbness and tingling. Pertinent negatives and positives per HPI. All others negative.   Social History   Socioeconomic History   Marital status: Married    Spouse name: Kayeleigh Dipietrantonio   Number of children: 1   Years of education: Not on file   Highest education level: Not on file  Occupational History   Occupation: Starting non profit  Tobacco Use   Smoking status: Never   Smokeless tobacco: Never  Vaping Use    Vaping Use: Never used  Substance and Sexual Activity   Alcohol use: Yes    Alcohol/week: 0.0 standard drinks of alcohol    Comment: a few times a year   Drug use: No   Sexual activity: Yes    Birth control/protection: Pill  Other Topics Concern   Not on file  Social History Narrative   03/01/21   From: the area   Living: with husband, Lennette Bihari and daughter   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 - [employer]      Family: daughter - Primitivo Gauze (2014)      Enjoys: exercise, spend time with family      Exercise: strength training - mix of everything - 5 days a week, 1 hour   Diet: low calorie, healthy diet and high protein diet      Safety   Seat belts: Yes    Guns: No   Safe in relationships: Yes    Social Determinants of Radio broadcast assistant Strain: Not on file  Food Insecurity: Not on file  Transportation Needs: Not on file  Physical  Activity: Not on file  Stress: Not on file  Social Connections: Not on file  Intimate Partner Violence: Not on file    Family History  Problem Relation Age of Onset   Sleep apnea Mother    Obesity Mother    Thyroid disease Father    Healthy Brother    Diabetes Maternal Grandmother    Breast cancer Maternal Grandmother 29   Parkinson's disease Maternal Grandmother    Tremor Maternal Grandmother    Kidney disease Paternal Grandmother    Thyroid disease Paternal Grandmother    Healthy Daughter    Anesthesia problems Neg Hx     Past Medical History:  Diagnosis Date   Fatty liver    Headache    Migraines   History of chicken pox    Hypothyroidism    IBS (irritable bowel syndrome)    Microscopic colitis    PCOS (polycystic ovarian syndrome)    Urinary tract infection    Vertigo     Patient Active Problem List   Diagnosis Date Noted   Left leg weakness 09/12/2022   Left sided sciatica 09/12/2022   Numbness and tingling in left hand 09/12/2022   Pre-syncope 09/12/2022   Neck pain on left side 02/26/2022    Muscle spasm of left shoulder 02/26/2022   Inappropriate lactation 10/20/2021   Vitamin D deficiency 10/20/2021   Healthcare maintenance 06/21/2021   Mixed hyperlipidemia 10/04/2020   Insulin resistance 06/28/2020   Hypothyroidism 05/31/2020   Elevated LFTs 05/31/2020   Depression 05/31/2020   Class 1 obesity with serious comorbidity and body mass index (BMI) of 33.0 to 33.9 in adult 05/31/2020   Female cystocele 02/25/2019   Pelvic floor relaxation, s/p pelvic PT, without improvement 02/25/2019   Genital herpes simplex, with rare outbreaks 02/25/2019   IBS (irritable bowel syndrome) 02/25/2019   Hyperstimulation of ovaries, with Hx of polycystic ovaries on [name] 05/05/2018   Hypothyroidism, acquired, autoimmune, on Levothyroxine, followed by Endocrinology 11/25/2016   Migraine, controlled with Topamax 11/01/2016    Past Surgical History:  Procedure Laterality Date   LAPAROSCOPIC OVARIAN CYSTECTOMY Bilateral 07/08/2019   Procedure: LAPAROSCOPIC OVARIAN CYSTECTOMY with peritoneal biopsies;  Surgeon: Allie Bossier, MD;  Location: [facility];  Service: Gynecology;  Laterality: Bilateral;   TONSILLECTOMY     adenoidectomy   WISDOM TOOTH EXTRACTION      Current Outpatient Medications  Medication Sig Dispense Refill   buPROPion (WELLBUTRIN SR) 150 MG 12 hr tablet Take 1 tablet (150 mg total) by mouth daily. 90 tablet 0   levothyroxine (SYNTHROID) 50 MCG tablet Take 1 tablet (50 mcg total) by mouth daily. 90 tablet 0   metFORMIN (GLUCOPHAGE) 500 MG tablet 2 tabs with supper 60 tablet 0   No current facility-administered medications for this visit.    Allergies as of 09/11/2022 - Review Complete 09/11/2022  Allergen Reaction Noted   Erythromycin  05/28/2013   Penicillins Hives 05/08/2012    Vitals: BP (!) 148/80   Pulse 70   Ht 5\' 6"  (1.676 m)   Wt 182 lb 12.8 oz (82.9 kg)   BMI 29.50 kg/m  Last Weight:  Wt Readings from Last 1 Encounters:  09/11/22 182 lb 12.8 oz  (82.9 kg)   Last Height:   Ht Readings from Last 1 Encounters:  09/11/22 5\' 6"  (1.676 m)     Physical exam: Exam: Gen: NAD, conversant, well nourised, well groomed  CV: RRR, no MRG. No Carotid Bruits. No peripheral edema, warm, nontender Eyes: Conjunctivae clear without exudates or hemorrhage  Neuro: Detailed Neurologic Exam  Speech:    Speech is normal; fluent and spontaneous with normal comprehension.  Cognition:    The patient is oriented to person, place, and time;     recent and remote memory intact;     language fluent;     normal attention, concentration,     fund of knowledge Cranial Nerves:    The pupils are equal, round, and reactive to light. The fundi are normal and spontaneous venous pulsations are present. Visual fields are full to finger confrontation. Extraocular movements are intact. Trigeminal sensation is intact and the muscles of mastication are normal. The face is symmetric. The palate elevates in the midline. Hearing intact. Voice is normal. Shoulder shrug is normal. The tongue has normal motion without fasciculations.   Coordination:    Normal   Gait: re normal.   Motor Observation:    No asymmetry, no atrophy, and no involuntary movements noted. Tone:    Normal muscle tone.    Posture:    Posture is normal. normal erect    Strength: minimal weakness in the left leg flexion, otherwise strength is V/V in the upper and lower limbs.      Sensation: intact to LT     Reflex Exam:  DTR's:    Deep tendon reflexes in the upper and lower extremities are normal bilaterally.   Toes:    The toes are downgoing bilaterally.   Clonus:    Clonus is absent.    Assessment/Plan:  patient with left arm and left leg paresthesias for > 10 years, also presyncope, has been evaluated by neurology (Dr. Posey Pronto neuromuscular expert) and [facility].   Pre-syncope: Lightheaded, feeling like she may pass out when standing quickly, has to hold  onto something for a minute and gets better and sitting feels better: sounds orthostatic. MRI brain normal. Should talk to primary care about cardiac evaluation. Will do orthostatics in the office but not symptomatic today, watch for water drinking, take notes when it happens (hot day, have you hydrated etc).   Left arm numbness: MRI brain and cervical spine was normal. CTS? Was evaluated by neurology Dr. Posey Pronto but never had an emg/ncs.  Options include reimaging the cervical spine, sending to hand center for emg/ncs for CTS, another option is wearing a wrist splint for a month and seeing if that helps otherwise emg/ncs for CTS or neurogenic thoracic outlet syndrome.   Left leg weakness and since having baby with epidural, pain was more when pregnant from sciatica, now more weakness when getting out of car, lifting child, no pain or shooting down the leg. Was evaluated by [facility] in the past I do not see imaging.  Options are MRI of the lumbar spine, physical therapy first to see if it improves, if lumbar degenrative disease could try injections or surgical options.   Patient chose the following:  Physical therapy for left leg weakness and then if no improvement MRi lumbar spine Wear a Carpal Tunnel Wrist splint for one month at bedtime. If no improvement emg/ncs Pre-synope: orthostatics today (however asymptomatic today), conservative measures, discuss if need cardiac eval with primary care.    09/11/2022  1022     BP- Lying: 130/79  Pulse- Lying: 66  BP- Sitting: 144/82  Pulse- Sitting: 60  BP- Standing at 0 minutes: 136/86  Pulse- Standing at 0 minutes: 65  BP-  Standing at 3 minutes: 130/88  Pulse- Standing at 3 minutes: 70    Orders Placed This Encounter  Procedures   Ambulatory referral to Physical Therapy   No orders of the defined types were placed in this encounter.   Cc: Judeth Porch, MD  Naomie Dean, MD  [facility] [address] [place], [place] [address]  Phone [phone] Fax [phone]  I spent over 60  minutes of face-to-face and non-face-to-face time with patient on the  1. Left leg weakness   2. Left sided sciatica   3. Numbness and tingling in left hand   4. Pre-syncope    diagnosis.  This included previsit chart review, lab review, study review, order entry, electronic health record documentation, patient education on the different diagnostic and therapeutic options, counseling and coordination of care, risks and benefits of management, compliance, or risk factor reduction

## 2022-09-11 NOTE — Patient Instructions (Signed)
Left leg: Options are MRI of the lumbar spine, physical therapy first to see if it improves, if lumbar degenrative disease could try injections or surgical options.   Left Arm: MRI brain and cervical spine was normal. Options include reimaging the cervical spine, sending to [facility] center for emg/ncs for CTS, another option is wearing a wrist splint for a month and seein gif that helps otherwise emg/ncs for CTS or neurogenic thoracic outlet syndrome.   VBT:YOMAYOK: see below for possible explanation orthostatic hypotension but she ould cardiac eval discuss with primary care as clinically warranted by pcp.  My recommendations:  Physical therapy for left leg weakness and then if no improvement MRi lumbar spine Wear a Carpal Tunnel Wrist splint for one month at bedtime. If no improvement emg/ncs Pre-synope: orthostatics today (however asymptomatic today), conservative measures, discuss if need cardiac eval with primary care.  Orthostatic Hypotension Blood pressure is a measurement of how strongly, or weakly, your circulating blood is pressing against the walls of your arteries. Orthostatic hypotension is a drop in blood pressure that can happen when you change positions, such as when you go from lying down to standing. Arteries are blood vessels that carry blood from your heart throughout your body. When blood pressure is too low, you may not get enough blood to your brain or to the rest of your organs. Orthostatic hypotension can cause light-headedness, sweating, rapid heartbeat, blurred vision, and fainting. These symptoms require further investigation into the cause. What are the causes? Orthostatic hypotension can be caused by many things, including: Sudden changes in posture, such as standing up quickly after you have been sitting or lying down. Loss of blood (anemia) or loss of body fluids (dehydration). Heart problems, neurologic problems, or hormone problems. Pregnancy. Aging. The risk for  this condition increases as you get older. Severe infection (sepsis). Certain medicines, such as medicines for high blood pressure or medicines that make the body lose excess fluids (diuretics). What are the signs or symptoms? Symptoms of this condition may include: Weakness, light-headedness, or dizziness. Sweating. Blurred vision. Tiredness (fatigue). Rapid heartbeat. Fainting, in severe cases. How is this diagnosed? This condition is diagnosed based on: Your symptoms and medical history. Your blood pressure measurements. Your health care provider will check your blood pressure when you are: Lying down. Sitting. Standing. A blood pressure reading is recorded as two numbers, such as "120 over 80" (or 120/80). The first ("top") number is called the systolic pressure. It is a measure of the pressure in your arteries as your heart beats. The second ("bottom") number is called the diastolic pressure. It is a measure of the pressure in your arteries when your heart relaxes between beats. Blood pressure is measured in a unit called mmHg. Healthy blood pressure for most adults is 120/80 mmHg. Orthostatic hypotension is defined as a 20 mmHg drop in systolic pressure or a 10 mmHg drop in diastolic pressure within 3 minutes of standing. Other information or tests that may be used to diagnose orthostatic hypotension include: Your other vital signs, such as your heart rate and temperature. Blood tests. An electrocardiogram (ECG) or echocardiogram. A Holter monitor. This is a device you wear that records your heart rhythm continuously, usually for 24-48 hours. Tilt table test. For this test, you will be safely secured to a table that moves you from a lying position to an upright position. Your heart rhythm and blood pressure will be monitored during the test. How is this treated? This condition may be treated  by: Changing your diet. This may involve eating more salt (sodium) or drinking more  water. Changing the dosage of certain medicines you are taking that might be lowering your blood pressure. Correcting the underlying reason for the orthostatic hypotension. Wearing compression stockings. Taking medicines to raise your blood pressure. Avoiding actions that trigger symptoms. Follow these instructions at home: Medicines Take over-the-counter and prescription medicines only as told by your health care provider. Follow instructions from your health care provider about changing the dosage of your current medicines, if this applies. Do not stop or adjust any of your medicines on your own. Eating and drinking  Drink enough fluid to keep your urine pale yellow. Eat extra salt only as directed. Do not add extra salt to your diet unless advised by your health care provider. Eat frequent, small meals. Avoid standing up suddenly after eating. General instructions  Get up slowly from lying down or sitting positions. This gives your blood pressure a chance to adjust. Avoid hot showers and excessive heat as directed by your health care provider. Engage in regular physical activity as directed by your health care provider. If you have compression stockings, wear them as told. Keep all follow-up visits. This is important. Contact a health care provider if: You have a fever for more than 2-3 days. You feel more thirsty than usual. You feel dizzy or weak. Get help right away if: You have chest pain. You have a fast or irregular heartbeat. You become sweaty or feel light-headed. You feel short of breath. You faint. You have any symptoms of a stroke. "BE FAST" is an easy way to remember the main warning signs of a stroke: B - Balance. Signs are dizziness, sudden trouble walking, or loss of balance. E - Eyes. Signs are trouble seeing or a sudden change in vision. F - Face. Signs are sudden weakness or numbness of the face, or the face or eyelid drooping on one side. A - Arms. Signs  are weakness or numbness in an arm. This happens suddenly and usually on one side of the body. S - Speech. Signs are sudden trouble speaking, slurred speech, or trouble understanding what people say. T - Time. Time to call emergency services. Write down what time symptoms started. You have other signs of a stroke, such as: A sudden, severe headache with no known cause. Nausea or vomiting. Seizure. These symptoms may represent a serious problem that is an emergency. Do not wait to see if the symptoms will go away. Get medical help right away. Call your local emergency services (911 in the [place] Do not drive yourself to the hospital. Summary Orthostatic hypotension is a sudden drop in blood pressure. It can cause light-headedness, sweating, rapid heartbeat, blurred vision, and fainting. Orthostatic hypotension can be diagnosed by having your blood pressure taken while lying down, sitting, and then standing. Treatment may involve changing your diet, wearing compression stockings, sitting up slowly, adjusting your medicines, or correcting the underlying reason for the orthostatic hypotension. Get help right away if you have chest pain, a fast or irregular heartbeat, or symptoms of a stroke. This information is not intended to replace advice given to you by your health care provider. Make sure you discuss any questions you have with your health care provider. Document Revised: 03/02/2021 Document Reviewed: 03/02/2021 Elsevier Patient Education  2023 [employer]   Electromyoneurogram Electromyoneurogram is a test to check how well your muscles and nerves are working. This procedure includes the combined use of electromyogram (  EMG) and nerve conduction study (NCS). EMG is used to evaluate muscles and the nerves that control those muscles. NCS, which is also called electroneurogram, measures how well your nerves conduct electricity. The procedures should be done together to check if your muscles and  nerves are healthy. If the results of the tests are abnormal, this may indicate disease or injury, such as a neuromuscular disease or peripheral nerve damage. Tell a health care provider about: Any allergies you have. All medicines you are taking, including vitamins, herbs, eye drops, creams, and over-the-counter medicines. Any bleeding problems you have. Any surgeries you have had. Any medical conditions you have. What are the risks? Generally, this is a safe procedure. However, problems may occur, including: Bleeding or bruising. Infection where the electrodes were inserted. What happens before the test? Medicines Take all of your usually prescribed medications before this testing is performed. Do not stop your blood thinners unless advised by your prescribing physician. General instructions Your health care provider may ask you to warm the limb that will be checked with warm water, hot pack, or wrapping the limb in a blanket. Do not use lotions or creams on the same day that you will be having the procedure. What happens during the test? For EMG  Your health care provider will ask you to stay in a position so that the muscle being studied can be accessed. You will be sitting or lying down. You may be given a medicine to numb the area (local anesthetic) and the skin will be disinfected. A very thin needle that has an electrode will be inserted into your muscle, one muscle at a time. Typically, multiple muscles are evaluated during a single study. Another small electrode will be placed on your skin near the muscle. Your health care provider will ask you to continue to remain still. The electrodes will record the electrical activity of your muscles. You may see this on a monitor or hear it in the room. After your muscles have been studied at rest, your health care provider will ask you to contract or flex your muscles. The electrodes will record the electrical activity of your  muscles. Your health care provider will remove the electrodes and the electrode needle when the procedure is finished. The procedure may vary among health care providers and hospitals. For NCS  An electrode that records your nerve activity (recording electrode) will be placed on your skin by the muscle that is being studied. An electrode that is used as a reference (reference electrode) will be placed near the recording electrode. A paste or gel will be applied to your skin between the recording electrode and the reference electrode. Your nerve will be stimulated with a mild shock. The speed of the nerves and strength of response is recorded by the electrodes. Your health care provider will remove the electrodes and the gel when the procedure is finished. The procedure may vary among health care providers and hospitals. What can I expect after the test? It is up to you to get your test results. Ask your health care provider, or the department that is doing the test, when your results will be ready. Your health care provider may: Give you medicines for any pain. Monitor the insertion sites to make sure that bleeding stops. You should be able to drive yourself to and from the test. Discomfort can persist for a few hours after the test, but should be better the next day. Contact a health care provider if: You  have swelling, redness, or drainage at any of the insertion sites. Summary Electromyoneurogram is a test to check how well your muscles and nerves are working. If the results of the tests are abnormal, this may indicate disease or injury. This is a safe procedure. However, problems may occur, such as bleeding and infection. Your health care provider will do two tests to complete this procedure. One checks your muscles (EMG) and another checks your nerves (NCS). It is up to you to get your test results. Ask your health care provider, or the department that is doing the test, when your  results will be ready. This information is not intended to replace advice given to you by your health care provider. Make sure you discuss any questions you have with your health care provider. Document Revised: 08/30/2021 Document Reviewed: 07/30/2021 Elsevier Patient Education  2023 [employer]  Carpal Tunnel Syndrome  Carpal tunnel syndrome is a condition that causes pain, numbness, and weakness in your hand and fingers. The carpal tunnel is a narrow area located on the palm side of your wrist. Repeated wrist motion or certain diseases may cause swelling within the tunnel. This swelling pinches the main nerve in the wrist. The main nerve in the wrist is called the median nerve. What are the causes? This condition may be caused by: Repeated and forceful wrist and hand motions. Wrist injuries. Arthritis. A cyst or tumor in the carpal tunnel. Fluid buildup during pregnancy. Use of tools that vibrate. Sometimes the cause of this condition is not known. What increases the risk? The following factors may make you more likely to develop this condition: Having a job that requires you to repeatedly or forcefully move your wrist or hand or requires you to use tools that vibrate. This may include jobs that involve using computers, working on an [employer], or working with power tools such as Radiographer, therapeutic. Being a woman. Having certain conditions, such as: Diabetes. Obesity. An underactive thyroid (hypothyroidism). Kidney failure. Rheumatoid arthritis. What are the signs or symptoms? Symptoms of this condition include: A tingling feeling in your fingers, especially in your thumb, index, and middle fingers. Tingling or numbness in your hand. An aching feeling in your entire arm, especially when your wrist and elbow are bent for a long time. Wrist pain that goes up your arm to your shoulder. Pain that goes down into your palm or fingers. A weak feeling in your hands. You may have  trouble grabbing and holding items. Your symptoms may feel worse during the night. How is this diagnosed? This condition is diagnosed with a medical history and physical exam. You may also have tests, including: Electromyogram (EMG). This test measures electrical signals sent by your nerves into the muscles. Nerve conduction study. This test measures how well electrical signals pass through your nerves. Imaging tests, such as X-rays, ultrasound, and MRI. These tests check for possible causes of your condition. How is this treated? This condition may be treated with: Lifestyle changes. It is important to stop or change the activity that caused your condition. Doing exercise and activities to strengthen and stretch your muscles and tendons (physical therapy). Making lifestyle changes to help with your condition and learning how to do your daily activities safely (occupational therapy). Medicines for pain and inflammation. This may include medicine that is injected into your wrist. A wrist splint or brace. Surgery. Follow these instructions at home: If you have a splint or brace: Wear the splint or brace as told  by your health care provider. Remove it only as told by your health care provider. Loosen the splint or brace if your fingers tingle, become numb, or turn cold and blue. Keep the splint or brace clean. If the splint or brace is not waterproof: Do not let it get wet. Cover it with a watertight covering when you take a bath or shower. Managing pain, stiffness, and swelling If directed, put ice on the painful area. To do this: If you have a removeable splint or brace, remove it as told by your health care provider. Put ice in a plastic bag. Place a towel between your skin and the bag or between the splint or brace and the bag. Leave the ice on for 20 minutes, 2-3 times a day. Do not fall asleep with the cold pack on your skin. Remove the ice if your skin turns bright red. This is very  important. If you cannot feel pain, heat, or cold, you have a greater risk of damage to the area. Move your fingers often to reduce stiffness and swelling. General instructions Take over-the-counter and prescription medicines only as told by your health care provider. Rest your wrist and hand from any activity that may be causing your pain. If your condition is work related, talk with your employer about changes that can be made, such as getting a wrist pad to use while typing. Do any exercises as told by your health care provider, physical therapist, or occupational therapist. Keep all follow-up visits. This is important. Contact a health care provider if: You have new symptoms. Your pain is not controlled with medicines. Your symptoms get worse. Get help right away if: You have severe numbness or tingling in your wrist or hand. Summary Carpal tunnel syndrome is a condition that causes pain, numbness, and weakness in your hand and fingers. It is usually caused by repeated wrist motions. Lifestyle changes and medicines are used to treat carpal tunnel syndrome. Surgery may be recommended. Follow your health care provider's instructions about wearing a splint, resting from activity, keeping follow-up visits, and calling for help. This information is not intended to replace advice given to you by your health care provider. Make sure you discuss any questions you have with your health care provider. Document Revised: 04/28/2020 Document Reviewed: 04/28/2020 Elsevier Patient Education  2023 [employer]  Sciatica  Sciatica is pain, numbness, weakness, or tingling along the path of the sciatic nerve. The sciatic nerve starts in the lower back and runs down the back of each leg. The nerve controls the muscles in the lower leg and in the back of the knee. It also provides feeling (sensation) to the back of the thigh, the lower leg, and the sole of the foot. Sciatica is a symptom of another medical  condition that pinches or puts pressure on the sciatic nerve. Sciatica most often only affects one side of the body. Sciatica usually goes away on its own or with treatment. In some cases, sciatica may come back (recur). What are the causes? This condition is caused by pressure on the sciatic nerve or pinching of the nerve. This may be the result of: A disk in between the bones of the spine bulging out too far (herniated disk). Age-related changes in the spinal disks. A pain disorder that affects a muscle in the buttock. Extra bone growth near the sciatic nerve. A break (fracture) of the pelvis. Pregnancy. Tumor. This is rare. What increases the risk? The following factors may make you  more likely to develop this condition: Playing sports that place pressure or stress on the spine. Having poor strength and flexibility. A history of back injury or surgery. Sitting for long periods of time. Doing activities that involve repetitive bending or lifting. Obesity. What are the signs or symptoms? Symptoms can vary from mild to very severe. They may include: Any of the following problems in the lower back, leg, hip, or buttock: Mild tingling, numbness, or dull aches. Burning sensations. Sharp pains. Numbness in the back of the calf or the sole of the foot. Leg weakness. Severe back pain that makes movement difficult. Symptoms may get worse when you cough, sneeze, or laugh, or when you sit or stand for long periods of time. How is this diagnosed? This condition may be diagnosed based on: Your symptoms and medical history. A physical exam. Blood tests. Imaging tests, such as: X-rays. An MRI. A CT scan. How is this treated? In many cases, this condition improves on its own without treatment. However, treatment may include: Reducing or modifying physical activity. Exercising, including strengthening and stretching. Icing and applying heat to the affected area. Medicines that help  to: Relieve pain and swelling. Relax your muscles. Injections of medicines that help to relieve pain and inflammation (steroids) around the sciatic nerve. Surgery. Follow these instructions at home: Medicines Take over-the-counter and prescription medicines only as told by your health care provider. Ask your health care provider if the medicine prescribed to you requires you to avoid driving or using heavy machinery. Managing pain     If directed, put ice on the affected area. To do this: Put ice in a plastic bag. Place a towel between your skin and the bag. Leave the ice on for 20 minutes, 2-3 times a day. If your skin turns bright red, remove the ice right away to prevent skin damage. The risk of skin damage is higher if you cannot feel pain, heat, or cold. If directed, apply heat to the affected area as often as told by your health care provider. Use the heat source that your health care provider recommends, such as a moist heat pack or a heating pad. Place a towel between your skin and the heat source. Leave the heat on for 20-30 minutes. If your skin turns bright red, remove the heat right away to prevent burns. The risk of burns is higher if you cannot feel pain, heat, or cold. Activity  Return to your normal activities as told by your health care provider. Ask your health care provider what activities are safe for you. Avoid activities that make your symptoms worse. Take brief periods of rest throughout the day. When you rest for longer periods, mix in some mild activity or stretching between periods of rest. This will help to prevent stiffness and pain. Avoid sitting for long periods of time without moving. Get up and move around at least one time each hour. Exercise and stretch regularly as told by your health care provider. Do not lift anything that is heavier than 10 lb (4.5 kg) until your health care provider says that it is safe. When you do not have symptoms, you should  still avoid heavy lifting, especially repetitive heavy lifting. When you lift objects, always use proper lifting technique, which includes: Bending your knees. Keeping the load close to your body. Avoiding twisting. General instructions Maintain a healthy weight. Excess weight puts extra stress on your back. Wear supportive, comfortable shoes. Avoid wearing high heels. Avoid sleeping on a  mattress that is too soft or too hard. A mattress that is firm enough to support your back when you sleep may help to reduce your pain. Contact a health care provider if: Your pain is not controlled by medicine. Your pain does not improve or gets worse. Your pain lasts longer than 4 weeks. You have unexplained weight loss. Get help right away if: You are not able to control when you urinate or have bowel movements (incontinence). You have: Weakness in your lower back, pelvis, buttocks, or legs that gets worse. Redness or swelling of your back. A burning sensation when you urinate. Summary Sciatica is pain, numbness, weakness, or tingling along the path of the sciatic nerve, which may include the lower back, legs, hips, and buttocks. This condition is caused by pressure on the sciatic nerve or pinching of the nerve. Treatment often includes rest, exercise, medicines, and applying ice or heat. This information is not intended to replace advice given to you by your health care provider. Make sure you discuss any questions you have with your health care provider. Document Revised: 03/26/2022 Document Reviewed: 03/26/2022 Elsevier Patient Education  2023 [employer].

## 2022-09-12 ENCOUNTER — Encounter: Payer: Self-pay | Admitting: Neurology

## 2022-09-12 DIAGNOSIS — R29898 Other symptoms and signs involving the musculoskeletal system: Secondary | ICD-10-CM | POA: Insufficient documentation

## 2022-09-12 DIAGNOSIS — R200 Anesthesia of skin: Secondary | ICD-10-CM | POA: Insufficient documentation

## 2022-09-12 DIAGNOSIS — M5432 Sciatica, left side: Secondary | ICD-10-CM | POA: Insufficient documentation

## 2022-09-12 DIAGNOSIS — R55 Syncope and collapse: Secondary | ICD-10-CM | POA: Insufficient documentation

## 2022-09-17 ENCOUNTER — Ambulatory Visit: Payer: 59 | Admitting: Physical Therapy

## 2022-09-24 ENCOUNTER — Other Ambulatory Visit: Payer: Self-pay

## 2022-09-24 ENCOUNTER — Ambulatory Visit: Payer: 59 | Attending: Family Medicine | Admitting: Physical Therapy

## 2022-09-24 ENCOUNTER — Encounter: Payer: Self-pay | Admitting: Physical Therapy

## 2022-09-24 VITALS — BP 129/77 | HR 62

## 2022-09-24 DIAGNOSIS — M6281 Muscle weakness (generalized): Secondary | ICD-10-CM | POA: Insufficient documentation

## 2022-09-24 DIAGNOSIS — R29898 Other symptoms and signs involving the musculoskeletal system: Secondary | ICD-10-CM | POA: Diagnosis not present

## 2022-09-24 DIAGNOSIS — M5432 Sciatica, left side: Secondary | ICD-10-CM | POA: Diagnosis present

## 2022-09-24 NOTE — Therapy (Signed)
OUTPATIENT PHYSICAL THERAPY LOWER EXTREMITY EVALUATION   Patient Name: [name] MRN: [id number] DOB:Sep 16, 1991, 31 y.o., female Today's Date: 09/24/2022   PCP: Lynnda Child., MD REFERRING PROVIDER: Anson Fret, MD    PT End of Session - 09/24/22 [phone]     Visit Number 1    Number of Visits 5   4+eval   Date for PT Re-Evaluation 11/23/22   Every other week   Authorization Type [employer]    PT Start Time 0845    PT Stop Time [phone]    PT Time Calculation (min) 53 min    Activity Tolerance Patient tolerated treatment well    Behavior During Therapy WFL for tasks assessed/performed             Past Medical History:  Diagnosis Date   Fatty liver    Headache    Migraines   History of chicken pox    Hypothyroidism    IBS (irritable bowel syndrome)    Microscopic colitis    PCOS (polycystic ovarian syndrome)    Urinary tract infection    Vertigo    Past Surgical History:  Procedure Laterality Date   LAPAROSCOPIC OVARIAN CYSTECTOMY Bilateral 07/08/2019   Procedure: LAPAROSCOPIC OVARIAN CYSTECTOMY with peritoneal biopsies;  Surgeon: Allie Bossier, MD;  Location: [facility];  Service: Gynecology;  Laterality: Bilateral;   TONSILLECTOMY     adenoidectomy   WISDOM TOOTH EXTRACTION     Patient Active Problem List   Diagnosis Date Noted   Left leg weakness 09/12/2022   Left sided sciatica 09/12/2022   Numbness and tingling in left hand 09/12/2022   Pre-syncope 09/12/2022   Neck pain on left side 02/26/2022   Muscle spasm of left shoulder 02/26/2022   Inappropriate lactation 10/20/2021   Vitamin D deficiency 10/20/2021   Healthcare maintenance 06/21/2021   Mixed hyperlipidemia 10/04/2020   Insulin resistance 06/28/2020   Hypothyroidism 05/31/2020   Elevated LFTs 05/31/2020   Depression 05/31/2020   Class 1 obesity with serious comorbidity and body mass index (BMI) of 33.0 to 33.9 in adult 05/31/2020   Female cystocele 02/25/2019    Pelvic floor relaxation, s/p pelvic PT, without improvement 02/25/2019   Genital herpes simplex, with rare outbreaks 02/25/2019   IBS (irritable bowel syndrome) 02/25/2019   Hyperstimulation of ovaries, with Hx of polycystic ovaries on [name] 05/05/2018   Hypothyroidism, acquired, autoimmune, on Levothyroxine, followed by Endocrinology 11/25/2016   Migraine, controlled with Topamax 11/01/2016    ONSET DATE: 09/11/2022   REFERRING DIAG: S28.315 (ICD-10-CM) - Left leg weakness M54.32 (ICD-10-CM) - Left sided sciatica   THERAPY DIAG:  Sciatica, left side  Muscle weakness (generalized)  Rationale for Evaluation and Treatment Rehabilitation  SUBJECTIVE:  SUBJECTIVE STATEMENT: "It starts in my lower butt and can radiate to the bottom outside part of my foot.  It comes and goes, but will happen when I get up out of the car.  When I scrunch my toes I feel like my calf wants to cramp, but usually doesn't."  She reports this has been happening since she had her daughter 10 years ago.  This also happens when she is taking groceries in.  It makes her anxious where she has to sit down and deep breathe. Pt accompanied by: self  PERTINENT HISTORY: migraine, IBS, acquired autoimmune hypothyroidism, history of polycystic ovaries, insulin resistance, depression, obesity, hyperlipidemia, neck pain, vitamin D deficiency, fatty liver, vertigo   PAIN:  Are you having pain? No  PRECAUTIONS: None  WEIGHT BEARING RESTRICTIONS No  FALLS: Has patient fallen in last 6 months? No  LIVING ENVIRONMENT: Lives with: lives with their spouse and lives with their daughter Lives in: House/apartment-townhouse Stairs: Yes: Internal: 20 steps; on right going up, on left going up, and split level w/ rail on left initially then  right Has following equipment at home: Crutches  PLOF: Bruce "To understand if things I'm doing are helping or hurting what's going on with my leg."  OBJECTIVE:   DIAGNOSTIC FINDINGS: No recent relevant.  PATIENT SURVEYS:  LEFS To be assessed at next visit.  COGNITION:  Overall cognitive status: Within functional limits for tasks assessed     SENSATION: Light touch: WFL  EDEMA:  None noted in BLE  MUSCLE LENGTH: Hamstrings: Right 0 deg; Left ~ 5 deg lacking   POSTURE: No Significant postural limitations  PALPATION: Not TTP, mild pain response to deep pressure over left mid-glut region  LOWER EXTREMITY ROM:  Active ROM Right eval Left eval  Hip flexion WNL WNL  Hip extension    Hip abduction " "  Hip adduction " "  Hip internal rotation    Hip external rotation    Knee flexion " "  Knee extension " "  Ankle dorsiflexion " "  Ankle plantarflexion    Ankle inversion    Ankle eversion     (Blank rows = not tested)  LOWER EXTREMITY MMT:  MMT Right eval Left eval  Hip flexion 4+/5 4+/5  Hip extension    Hip abduction 3+/5 3+/5  Hip adduction 4/5 4/5  Hip internal rotation    Hip external rotation    Knee flexion 4/5 4-/5; pt endorses pain in anterolateral thigh  Knee extension 5/5 5/5  Ankle dorsiflexion 5/5 5/5  Ankle plantarflexion    Ankle inversion    Ankle eversion     (Blank rows = not tested)  LOWER EXTREMITY SPECIAL TESTS:  Hip special tests: Saralyn Pilar (FABER) test: positive  and Trendelenburg test: positive -compensated on left w/ left lateral trunk lean SLUMP:  + Side-lying Piriformis:  + Piriformis stretch in sitting:  tightness in piriformis/sciatic distribution down to knee  FUNCTIONAL TESTS:  5 times sit to stand: 11.91 sec w/o UE use  GAIT: Distance walked: various clinic distances Assistive device utilized: None Level of assistance: Complete Independence Comments: No notable imbalance or gait  deviations.  TODAY'S TREATMENT:  See HEP.   PATIENT EDUCATION: Education details: Initial HEP, discussed financial strain with POC modifications, assessments used, PT POC and goals to be set. Person educated: Patient Education method: Explanation Education comprehension: verbalized understanding   HOME EXERCISE PROGRAM: Access Code: [email or web address] URL: [email or web address] Date: 09/24/2022 Prepared by: Elease Etienne  Exercises - Supine Figure 4 Piriformis Stretch  - 1 x daily - 7 x weekly - 3 sets - 10 reps - Supine Sciatic Nerve Glide  - 1 x daily - 7 x weekly - 3 sets - 10 reps - Supine Bridge with Resistance Band  - 1 x daily - 7 x weekly - 3 sets - 10 reps    GOALS: Goals reviewed with patient? Yes  SHORT TERM GOALS=LONG TERM GOALS: Target date: 11/23/2022  Pt will be independent with LE strength and stretching HEP. Baseline:  Established on eval. Goal status: INITIAL  2.  LEFS to be assessed w/ goal set as appropriate. Baseline: To be assessed. Goal status: INITIAL  3.  Pt will report decreased N/T in foot during activity, prolonged standing, and transfers from sitting to standing. Baseline: N/T when transferring from sit<>stand, etc. Goal status: INITIAL  4.  Pt will have a negative SLUMP test to demonstrate improved neural tension in LE. Baseline: SLUMP + on left Goal status: INITIAL  ASSESSMENT:  CLINICAL IMPRESSION: Patient is a 31 y.o. female who was seen today for physical therapy evaluation and treatment for left sciatica.  Pt has a significant PMH of migraine, IBS, acquired autoimmune hypothyroidism, history of polycystic ovaries, insulin resistance, depression, obesity, hyperlipidemia, neck pain, vitamin D deficiency, fatty liver, and vertigo.  She has vaginally delivered a single child 10 years ago when she first recalls this issue starting and has received pelvic floor PT prior without much relief related to this issue.  Identified  impairments include left hamstring weakness and mild generalized LE weakness, positional N/T on LLE from glut to lateral plantar aspect of foot, and positive neural tension testing.  Her 5xSTS is within normal limits not indicating fall risk due to functional LE weakness.  At this time she would benefit from skilled PT to address impairments as noted and progress towards long term goals.  OBJECTIVE IMPAIRMENTS difficulty walking, decreased strength, impaired flexibility, and impaired sensation.   ACTIVITY LIMITATIONS bending, squatting, transfers, locomotion level, and caring for others  PARTICIPATION LIMITATIONS: community activity, occupation, and yard work  PERSONAL [place], Past/current experiences, Sex, Time since onset of injury/illness/exacerbation, 1-2 comorbidities: polycystic ovaries and vertigo, and financial strain of copay  are also affecting patient's functional outcome.   REHAB POTENTIAL: Excellent  CLINICAL DECISION MAKING: Stable/uncomplicated  EVALUATION COMPLEXITY: Low  PLAN: PT FREQUENCY: every other week  PT DURATION: 8 weeks (4 visits)  PLANNED INTERVENTIONS: Therapeutic exercises, Therapeutic activity, Neuromuscular re-education, Balance training, Gait training, Patient/Family education, Self Care, Joint mobilization, Manual therapy, and Re-evaluation  PLAN FOR NEXT SESSION: Assess LEFS w/ LTG set.  Review and modify HEP-pt requesting exercises to self-manage.  Soft tissue mobilization to piriformis.  Deepened piriformis stretch.  Glut strengthening, low back stretching.   Sadie Haber, PT, DPT 09/24/2022, 2:36 PM

## 2022-09-24 NOTE — Patient Instructions (Signed)
Access Code: [email or web address] URL: [email or web address] Date: 09/24/2022 Prepared by: Elease Etienne  Exercises - Supine Figure 4 Piriformis Stretch  - 1 x daily - 7 x weekly - 3 sets - 10 reps - Supine Sciatic Nerve Glide  - 1 x daily - 7 x weekly - 3 sets - 10 reps - Supine Bridge with Resistance Band  - 1 x daily - 7 x weekly - 3 sets - 10 reps

## 2022-09-25 ENCOUNTER — Ambulatory Visit (INDEPENDENT_AMBULATORY_CARE_PROVIDER_SITE_OTHER): Payer: 59 | Admitting: Adult Health

## 2022-09-25 ENCOUNTER — Other Ambulatory Visit (INDEPENDENT_AMBULATORY_CARE_PROVIDER_SITE_OTHER): Payer: Self-pay | Admitting: Adult Health

## 2022-09-25 ENCOUNTER — Encounter (INDEPENDENT_AMBULATORY_CARE_PROVIDER_SITE_OTHER): Payer: Self-pay | Admitting: Adult Health

## 2022-09-25 VITALS — BP 121/81 | HR 60 | Temp 98.1°F | Ht 66.0 in | Wt 175.0 lb

## 2022-09-25 DIAGNOSIS — E669 Obesity, unspecified: Secondary | ICD-10-CM | POA: Diagnosis not present

## 2022-09-25 DIAGNOSIS — Z6829 Body mass index (BMI) 29.0-29.9, adult: Secondary | ICD-10-CM

## 2022-09-25 DIAGNOSIS — G629 Polyneuropathy, unspecified: Secondary | ICD-10-CM | POA: Diagnosis not present

## 2022-09-25 DIAGNOSIS — E8881 Metabolic syndrome: Secondary | ICD-10-CM

## 2022-09-25 DIAGNOSIS — Z9189 Other specified personal risk factors, not elsewhere classified: Secondary | ICD-10-CM

## 2022-09-25 DIAGNOSIS — F3289 Other specified depressive episodes: Secondary | ICD-10-CM

## 2022-09-25 MED ORDER — METFORMIN HCL 500 MG PO TABS: ORAL_TABLET | ORAL | 0 refills | Status: DC

## 2022-09-25 MED ORDER — BUPROPION HCL ER (SR) 150 MG PO TB12: 150.0000 mg | ORAL_TABLET | Freq: Every day | ORAL | 0 refills | Status: DC

## 2022-09-27 DIAGNOSIS — Z9189 Other specified personal risk factors, not elsewhere classified: Secondary | ICD-10-CM | POA: Insufficient documentation

## 2022-09-27 DIAGNOSIS — G629 Polyneuropathy, unspecified: Secondary | ICD-10-CM | POA: Insufficient documentation

## 2022-09-27 NOTE — Progress Notes (Signed)
Chief Complaint:   OBESITY [name] is here to discuss her progress with her obesity treatment plan along with follow-up of her obesity related diagnoses. [name] is on keeping a food journal and adhering to recommended goals of 1200-1300 calories and 90 protein and states she is following her eating plan approximately 70% of the time. [name] states she is gym 60 minutes 4 times per week.  Today's visit was #: 86 Starting weight: 199 lbs Starting date: 05/31/2020 Today's weight: 175 lbs Today's date: 09/25/2022 Total lbs lost to date: 24 lbs Total lbs lost since last in-office visit: 2 lbs  Interim History:  07/30/2022, IC revealed BMR of 1714.   Last week she reports acute symptoms, nausea, vomiting, diarrhea, then developed into a headache and nasal congestion.   When not feeling well, she will eat saltine crackers, cutie oranges.   She is striving to consume 1200-1300 calories with 120 gram of protein per day.    Subjective:   1. Insulin resistance 04/09/2022, Insulin level 7.5.   Currently on metformin 500 mg 2 tablets at supper, no GI upset.   2. Neuropathy Due to worsening numbness, tingling in bilateral arms, she was referred to Neurology.   Initial office visit with Dr Ahern/Neurology on 09/11/2022:  Assessment/Plan:  patient with left arm and left leg paresthesias for > 10 years, also presyncope, has been evaluated by neurology (Dr. Posey Pronto neuromuscular expert) and [facility].    Pre-syncope: Lightheaded, feeling like she may pass out when standing quickly, has to hold onto something for a minute and gets better and sitting feels better: sounds orthostatic. MRI brain normal. Should talk to primary care about cardiac evaluation. Will do orthostatics in the office but not symptomatic today, watch for water drinking, take notes when it happens (hot day, have you hydrated etc).    Left arm numbness: MRI brain and cervical spine was normal. CTS? Was evaluated by neurology  Dr. Posey Pronto but never had an emg/ncs.  Options include reimaging the cervical spine, sending to hand center for emg/ncs for CTS, another option is wearing a wrist splint for a month and seeing if that helps otherwise emg/ncs for CTS or neurogenic thoracic outlet syndrome.    Left leg weakness and since having baby with epidural, pain was more when pregnant from sciatica, now more weakness when getting out of car, lifting child, no pain or shooting down the leg. Was evaluated by [facility] in the past I do not see imaging.  Options are MRI of the lumbar spine, physical therapy first to see if it improves, if lumbar degenrative disease could try injections or surgical options.    Patient chose the following:   Physical therapy for left leg weakness and then if no improvement MRi lumbar spine Wear a Carpal Tunnel Wrist splint for one month at bedtime. If no improvement emg/ncs Pre-synope: orthostatics today (however asymptomatic today), conservative measures, discuss if need cardiac eval with primary care.    3. Other depression with emotional eating She endorses sable mood, denies suicidal or homicidal ideations.  BP stable at OV. She is on daily buPROPion (WELLBUTRIN SR) 150 MG 12 hr tablet  4. At risk for diarrhea Florine is at higher risk of diarrhea due to medications and diet due to metformin for insulin resistance.   Assessment/Plan:   1. Insulin resistance Refill - metFORMIN (GLUCOPHAGE) 500 MG tablet; 2 tabs with supper  Dispense: 60 tablet; Refill: 0  2. Neuropathy Continue PT, follow up with Neurology.  3. Other depression with emotional eating Refill - buPROPion (WELLBUTRIN SR) 150 MG 12 hr tablet; Take 1 tablet (150 mg total) by mouth daily.  Dispense: 90 tablet; Refill: 0  4. At risk for diarrhea [name] was given approximately 15 minutes of diarrhea prevention counseling today. She is 31 y.o. female and has risk factors for diarrhea including medications and changes in diet. We  discussed intensive lifestyle modifications today with an emphasis on specific weight loss instructions including dietary strategies.   Repetitive spaced learning was employed today to elicit superior memory formation and behavioral change.   5. Obesity with current BMI of 29.2 Florie is currently in the action stage of change. As such, her goal is to continue with weight loss efforts. She has agreed to keeping a food journal and adhering to recommended goals of 1200-1300 calories and 90 protein daily.  Exercise goals:  As is.   Behavioral modification strategies: increasing lean protein intake, decreasing simple carbohydrates, meal planning and cooking strategies, keeping healthy foods in the home, and planning for success.  [name] has agreed to follow-up with our clinic in 4 weeks. She was informed of the importance of frequent follow-up visits to maximize her success with intensive lifestyle modifications for her multiple health conditions.   Objective:   Blood pressure 121/81, pulse 60, temperature 98.1 F (36.7 C), height 5\' 6"  (1.676 m), weight 175 lb (79.4 kg), SpO2 99 %. Body mass index is 28.25 kg/m.  General: Cooperative, alert, well developed, in no acute distress. HEENT: Conjunctivae and lids unremarkable. Cardiovascular: Regular rhythm.  Lungs: Normal work of breathing. Neurologic: No focal deficits.   Lab Results  Component Value Date   CREATININE 0.86 07/30/2022   BUN 14 07/30/2022   NA 140 07/30/2022   K 4.4 07/30/2022   CL 101 07/30/2022   CO2 26 07/30/2022   Lab Results  Component Value Date   ALT 31 07/30/2022   AST 22 07/30/2022   ALKPHOS 80 07/30/2022   BILITOT <0.2 07/30/2022   Lab Results  Component Value Date   HGBA1C 5.1 04/09/2022   HGBA1C 5.0 11/16/2021   HGBA1C 5.3 06/21/2021   HGBA1C 5.0 02/21/2021   HGBA1C 5.0 05/31/2020   Lab Results  Component Value Date   INSULIN 7.5 04/09/2022   INSULIN 11.2 11/16/2021   INSULIN 10.6 06/21/2021    INSULIN 14.9 02/21/2021   INSULIN 17.8 10/24/2020   Lab Results  Component Value Date   TSH 1.420 07/30/2022   Lab Results  Component Value Date   CHOL 202 (H) 04/09/2022   HDL 61 04/09/2022   LDLCALC 125 (H) 04/09/2022   TRIG 90 04/09/2022   CHOLHDL 3.3 06/21/2021   Lab Results  Component Value Date   VD25OH 47.4 07/30/2022   VD25OH 36.4 04/09/2022   VD25OH 103.0 (H) 11/16/2021   Lab Results  Component Value Date   WBC 6.2 07/30/2022   HGB 13.5 07/30/2022   HCT 40.1 07/30/2022   MCV 88 07/30/2022   PLT 279 07/30/2022   Lab Results  Component Value Date   IRON 57 02/24/2019   FERRITIN 30.7 02/24/2019   Attestation Statements:   Reviewed by clinician on day of visit: allergies, medications, problem list, medical history, surgical history, family history, social history, and previous encounter notes.  I, 02/26/2019, RMA, am acting as [facility] [place] for Energy manager, NP.  I have reviewed the above documentation for accuracy and completeness, and I agree with the above. -  Maiya Kates d. Junie Engram, NP-C

## 2022-10-09 ENCOUNTER — Other Ambulatory Visit (INDEPENDENT_AMBULATORY_CARE_PROVIDER_SITE_OTHER): Payer: Self-pay | Admitting: Nurse Practitioner

## 2022-10-09 ENCOUNTER — Encounter: Payer: Self-pay | Admitting: Physical Therapy

## 2022-10-09 ENCOUNTER — Ambulatory Visit: Payer: 59 | Attending: Family Medicine | Admitting: Physical Therapy

## 2022-10-09 DIAGNOSIS — M5432 Sciatica, left side: Secondary | ICD-10-CM | POA: Diagnosis not present

## 2022-10-09 DIAGNOSIS — M6281 Muscle weakness (generalized): Secondary | ICD-10-CM | POA: Diagnosis present

## 2022-10-09 DIAGNOSIS — F3289 Other specified depressive episodes: Secondary | ICD-10-CM

## 2022-10-09 NOTE — Patient Instructions (Addendum)
Access Code: [email or web address] URL: [email or web address] Date: 10/09/2022 Prepared by: Elease Etienne  Exercises - Supine Sciatic Nerve Glide  - 1 x daily - 7 x weekly - 3 sets - 10 reps - Supine Bridge with Resistance Band  - 1 x daily - 7 x weekly - 3 sets - 10 reps - Supine Piriformis Stretch  - 1 x daily - 7 x weekly - 1 sets - 2-3 reps - 30-45 sec hold - Seated Sciatic Tensioner  - 1 x daily - 7 x weekly - 1 sets - 10 reps - 2-3 sec hold - Heel Raise on Step  - 1 x daily - 7 x weekly - 3 sets - 10 reps - Single Leg Heel Raise with Counter Support  - 1 x daily - 7 x weekly - 3 sets - 10 reps

## 2022-10-09 NOTE — Therapy (Signed)
OUTPATIENT PHYSICAL THERAPY TREATMENT NOTE   Patient Name: [name] MRN: [id number] DOB:08/04/1991, 31 y.o., female Today's Date: 10/09/2022  PCP: Lesleigh Noe., MD REFERRING PROVIDER: Melvenia Beam, MD   END OF SESSION:   PT End of Session - 10/09/22 1147     Visit Number 2    Number of Visits 5   4+eval   Date for PT Re-Evaluation 11/23/22   Every other week   Authorization Type [employer]    PT Start Time 4098    PT Stop Time 1230    PT Time Calculation (min) 45 min    Activity Tolerance Patient tolerated treatment well    Behavior During Therapy WFL for tasks assessed/performed             Past Medical History:  Diagnosis Date   Fatty liver    Headache    Migraines   History of chicken pox    Hypothyroidism    IBS (irritable bowel syndrome)    Microscopic colitis    PCOS (polycystic ovarian syndrome)    Urinary tract infection    Vertigo    Past Surgical History:  Procedure Laterality Date   LAPAROSCOPIC OVARIAN CYSTECTOMY Bilateral 07/08/2019   Procedure: LAPAROSCOPIC OVARIAN CYSTECTOMY with peritoneal biopsies;  Surgeon: Emily Filbert, MD;  Location: [facility];  Service: Gynecology;  Laterality: Bilateral;   TONSILLECTOMY     adenoidectomy   WISDOM TOOTH EXTRACTION     Patient Active Problem List   Diagnosis Date Noted   Neuropathy 09/27/2022   At risk for diarrhea 09/27/2022   Left leg weakness 09/12/2022   Left sided sciatica 09/12/2022   Numbness and tingling in left hand 09/12/2022   Pre-syncope 09/12/2022   Neck pain on left side 02/26/2022   Muscle spasm of left shoulder 02/26/2022   Inappropriate lactation 10/20/2021   Vitamin D deficiency 10/20/2021   Healthcare maintenance 06/21/2021   Mixed hyperlipidemia 10/04/2020   Insulin resistance 06/28/2020   Hypothyroidism 05/31/2020   Elevated LFTs 05/31/2020   Depression 05/31/2020   Class 1 obesity with serious comorbidity and body mass index (BMI) of 33.0 to  33.9 in adult 05/31/2020   Female cystocele 02/25/2019   Pelvic floor relaxation, s/p pelvic PT, without improvement 02/25/2019   Genital herpes simplex, with rare outbreaks 02/25/2019   IBS (irritable bowel syndrome) 02/25/2019   Hyperstimulation of ovaries, with Hx of polycystic ovaries on [name] 05/05/2018   Hypothyroidism, acquired, autoimmune, on Levothyroxine, followed by Endocrinology 11/25/2016   Migraine, controlled with Topamax 11/01/2016    REFERRING DIAG: R29.898 (ICD-10-CM) - Left leg weakness M54.32 (ICD-10-CM) - Left sided sciatica   THERAPY DIAG:  Sciatica, left side  Muscle weakness (generalized)  Rationale for Evaluation and Treatment Rehabilitation  PERTINENT HISTORY: migraine, IBS, acquired autoimmune hypothyroidism, history of polycystic ovaries, insulin resistance, depression, obesity, hyperlipidemia, neck pain, vitamin D deficiency, fatty liver, vertigo   PRECAUTIONS: None  SUBJECTIVE: "I have been doing the stretches, not everyday, but whenever I feel some aggravation, and they seem to be helping."  She went to the gym and has been focusing primarily on stretching.  PAIN:  Are you having pain? No   OBJECTIVE: (objective measures completed at initial evaluation unless otherwise dated)   TODAY'S TREATMENT:  -Pt fills out the LEFS = 90% -Reviewed supine piriformis stretch > deepened supine piriformis stretch 2x30 sec -Supine sciatic nerve glide x30 sec > modified to sciatic nerve tensioner x30 sec -Deep squats w/ countertop support  x12 -Deep lunges w/ unilateral countertop support x10 each LE, pt does not advance RLE as far fwd, notable fatigue w/ LLE in rear -Heel raises at countertop x12 > eccentric heel raises dropped off step w/ BUE support x10 > discussed and demonstrated single leg lower vs single leg heel raise to progress towards as pt already does heel raises at desk at work, pt returns demo x5 w/ emphasis on left lower -Bilateral leg press 100# 3x10 >  left LE 70# 2x12 > discussed calf raises on leg press and return to using incline leg press w/ lowered weight and working back into regular strengthening gym routine -Lower back roll-outs w/ medium physioball x3 each direction w/ 5 second hold, cued pt to breath especially on return upright; pt endorses this as adequate and relieving stretch   PATIENT EDUCATION: Education details: Continue with additions to HEP, discussed plan for remaining sessions. Person educated: Patient Education method: Explanation Education comprehension: verbalized understanding     HOME EXERCISE PROGRAM: Access Code: [email or web address] URL: [email or web address] Date: 09/24/2022 Prepared by: Camille Bal   Exercises - Supine Figure 4 Piriformis Stretch  - 1 x daily - 7 x weekly - 3 sets - 10 reps - Supine Sciatic Nerve Glide  - 1 x daily - 7 x weekly - 3 sets - 10 reps - Supine Bridge with Resistance Band  - 1 x daily - 7 x weekly - 3 sets - 10 reps  *Changes made 10/09/2022 - pt states she likes using the app and will continue (does not need updated printout).     GOALS: Goals reviewed with patient? Yes   SHORT TERM GOALS=LONG TERM GOALS: Target date: 11/23/2022   Pt will be independent with LE strength and stretching HEP. Baseline:  Established on eval. Goal status: INITIAL   2.  Pt will score >/=99% on the LEFS assessment in order to indicate reduced functional impairment. Baseline: 90% 10/09/2022 Goal status: INITIAL   3.  Pt will report decreased N/T in foot during activity, prolonged standing, and transfers from sitting to standing. Baseline: N/T when transferring from sit<>stand, etc. Goal status: INITIAL   4.  Pt will have a negative SLUMP test to demonstrate improved neural tension in LE. Baseline: SLUMP + on left Goal status: INITIAL   ASSESSMENT:   CLINICAL IMPRESSION: Reviewed and modified HEP this visit to continue targeting left LE strength and symptom management.  Pt so  far is endorsing positive response to stretching and strengthening program.  Will continue to progress patient towards LTGs and ultimate return to typical gym routine.   OBJECTIVE IMPAIRMENTS difficulty walking, decreased strength, impaired flexibility, and impaired sensation.    ACTIVITY LIMITATIONS bending, squatting, transfers, locomotion level, and caring for others   PARTICIPATION LIMITATIONS: community activity, occupation, and yard work   PERSONAL [place], Past/current experiences, Sex, Time since onset of injury/illness/exacerbation, 1-2 comorbidities: polycystic ovaries and vertigo, and financial strain of copay  are also affecting patient's functional outcome.    REHAB POTENTIAL: Excellent   CLINICAL DECISION MAKING: Stable/uncomplicated   EVALUATION COMPLEXITY: Low   PLAN: PT FREQUENCY: every other week   PT DURATION: 8 weeks (4 visits)   PLANNED INTERVENTIONS: Therapeutic exercises, Therapeutic activity, Neuromuscular re-education, Balance training, Gait training, Patient/Family education, Self Care, Joint mobilization, Manual therapy, and Re-evaluation   PLAN FOR NEXT SESSION: Review and modify HEP prn-pt requesting exercises to self-manage.  Soft tissue mobilization to piriformis.  Glut strengthening, low back stretching.  Continue leg press.  Y balance.  KTC stretch.   Sadie Haber, PT, DPT 10/09/2022, 12:32 PM

## 2022-10-23 ENCOUNTER — Encounter: Payer: Self-pay | Admitting: Physical Therapy

## 2022-10-23 ENCOUNTER — Ambulatory Visit: Payer: 59 | Admitting: Physical Therapy

## 2022-10-23 DIAGNOSIS — M5432 Sciatica, left side: Secondary | ICD-10-CM

## 2022-10-23 DIAGNOSIS — M6281 Muscle weakness (generalized): Secondary | ICD-10-CM

## 2022-10-23 NOTE — Therapy (Signed)
OUTPATIENT PHYSICAL THERAPY TREATMENT NOTE   Patient Name: [name] MRN: [id number] DOB:06/24/1991, 31 y.o., female Today's Date: 10/23/2022  PCP: Lynnda Child., MD REFERRING PROVIDER: Anson Fret, MD   END OF SESSION:   PT End of Session - 10/23/22 0853     Visit Number 3    Number of Visits 5   4+eval   Date for PT Re-Evaluation 11/23/22   Every other week   Authorization Type [employer]    PT Start Time [phone]   Pt in bathroom at onset of session.   PT Stop Time 0930    PT Time Calculation (min) 40 min    Activity Tolerance Patient tolerated treatment well    Behavior During Therapy WFL for tasks assessed/performed             Past Medical History:  Diagnosis Date   Fatty liver    Headache    Migraines   History of chicken pox    Hypothyroidism    IBS (irritable bowel syndrome)    Microscopic colitis    PCOS (polycystic ovarian syndrome)    Urinary tract infection    Vertigo    Past Surgical History:  Procedure Laterality Date   LAPAROSCOPIC OVARIAN CYSTECTOMY Bilateral 07/08/2019   Procedure: LAPAROSCOPIC OVARIAN CYSTECTOMY with peritoneal biopsies;  Surgeon: Allie Bossier, MD;  Location: [facility];  Service: Gynecology;  Laterality: Bilateral;   TONSILLECTOMY     adenoidectomy   WISDOM TOOTH EXTRACTION     Patient Active Problem List   Diagnosis Date Noted   Neuropathy 09/27/2022   At risk for diarrhea 09/27/2022   Left leg weakness 09/12/2022   Left sided sciatica 09/12/2022   Numbness and tingling in left hand 09/12/2022   Pre-syncope 09/12/2022   Neck pain on left side 02/26/2022   Muscle spasm of left shoulder 02/26/2022   Inappropriate lactation 10/20/2021   Vitamin D deficiency 10/20/2021   Healthcare maintenance 06/21/2021   Mixed hyperlipidemia 10/04/2020   Insulin resistance 06/28/2020   Hypothyroidism 05/31/2020   Elevated LFTs 05/31/2020   Depression 05/31/2020   Class 1 obesity with serious comorbidity  and body mass index (BMI) of 33.0 to 33.9 in adult 05/31/2020   Female cystocele 02/25/2019   Pelvic floor relaxation, s/p pelvic PT, without improvement 02/25/2019   Genital herpes simplex, with rare outbreaks 02/25/2019   IBS (irritable bowel syndrome) 02/25/2019   Hyperstimulation of ovaries, with Hx of polycystic ovaries on [name] 05/05/2018   Hypothyroidism, acquired, autoimmune, on Levothyroxine, followed by Endocrinology 11/25/2016   Migraine, controlled with Topamax 11/01/2016    REFERRING DIAG: R29.898 (ICD-10-CM) - Left leg weakness M54.32 (ICD-10-CM) - Left sided sciatica   THERAPY DIAG:  Sciatica, left side  Muscle weakness (generalized)  Rationale for Evaluation and Treatment Rehabilitation  PERTINENT HISTORY: migraine, IBS, acquired autoimmune hypothyroidism, history of polycystic ovaries, insulin resistance, depression, obesity, hyperlipidemia, neck pain, vitamin D deficiency, fatty liver, vertigo   PRECAUTIONS: None  SUBJECTIVE: Pt has been focusing on endurance cardio and stretching.  She cannot recall her last episode, they no longer happen every time she exits the car.  She reports having some mild cramping/tightness mid-foot in the right foot during stretches and exercises.  PAIN:  Are you having pain? No   OBJECTIVE: (objective measures completed at initial evaluation unless otherwise dated)   TODAY'S TREATMENT:  -PT explains connection of structures in the foot and calf and potential scenarios for tightness midfoot with suggestions to manage  including stretching and myofascial work with tennis ball. -Child's pose 2x30sec -Quadruped cat-cow x20 w/ paced breathing -Birddogs x10 each side -single knee to chest stretch 2x30 sec each side > double knee to chest stretch 2x30 sec -Lower trunk rotations x20; edu on TrA activation throughout -Leg press 100# x10 > 120# 2x10, 70# 2x12 LLE only Answered questions of return to activity and progression of  activity.  PATIENT EDUCATION: Education details: Continue with additions to HEP, discussed resources online for body weight workout guidance.  Discussion of potential discharge next visit as pt is improving and feels she may be ready. Person educated: Patient Education method: Explanation Education comprehension: verbalized understanding     HOME EXERCISE PROGRAM: Access Code: [email or web address] URL: [email or web address] Date: 09/24/2022 Prepared by: Elease Etienne   Exercises - Supine Figure 4 Piriformis Stretch  - 1 x daily - 7 x weekly - 3 sets - 10 reps - Supine Sciatic Nerve Glide  - 1 x daily - 7 x weekly - 3 sets - 10 reps - Supine Bridge with Resistance Band  - 1 x daily - 7 x weekly - 3 sets - 10 reps  *Changes made 10/09/2022 - pt states she likes using the app and will continue (does not need updated printout).     GOALS: Goals reviewed with patient? Yes   SHORT TERM GOALS=LONG TERM GOALS: Target date: 11/23/2022   Pt will be independent with LE strength and stretching HEP. Baseline:  Established on eval. Goal status: INITIAL   2.  Pt will score >/=99% on the LEFS assessment in order to indicate reduced functional impairment. Baseline: 90% 10/09/2022 Goal status: INITIAL   3.  Pt will report decreased N/T in foot during activity, prolonged standing, and transfers from sitting to standing. Baseline: N/T when transferring from sit<>stand, etc. Goal status: INITIAL   4.  Pt will have a negative SLUMP test to demonstrate improved neural tension in LE. Baseline: SLUMP + on left Goal status: INITIAL   ASSESSMENT:   CLINICAL IMPRESSION: Pt is making great improvement with PT and tolerates all progression of activity today without issue.  She is tolerating steady weight progression with leg press activity.  Extensive edu on returning to her regular gym regimen and safe progression of activity.  Pt remains motivated and may be appropriate for discharge at  following visit.   OBJECTIVE IMPAIRMENTS difficulty walking, decreased strength, impaired flexibility, and impaired sensation.    ACTIVITY LIMITATIONS bending, squatting, transfers, locomotion level, and caring for others   PARTICIPATION LIMITATIONS: community activity, occupation, and yard work   PERSONAL Education officer, community, Past/current experiences, Sex, Time since onset of injury/illness/exacerbation, 1-2 comorbidities: polycystic ovaries and vertigo, and financial strain of copay  are also affecting patient's functional outcome.    REHAB POTENTIAL: Excellent   CLINICAL DECISION MAKING: Stable/uncomplicated   EVALUATION COMPLEXITY: Low   PLAN: PT FREQUENCY: every other week   PT DURATION: 8 weeks (4 visits)   PLANNED INTERVENTIONS: Therapeutic exercises, Therapeutic activity, Neuromuscular re-education, Balance training, Gait training, Patient/Family education, Self Care, Joint mobilization, Manual therapy, and Re-evaluation   PLAN FOR NEXT SESSION: Assess LTGs-D/C?  Review and modify HEP prn-pt requesting exercises to self-manage.  Soft tissue mobilization to piriformis.  Glut strengthening, low back stretching.  Continue leg press.  Y balance.  [place]ann progression.  Backwards walking on inclined treadmill.   Bary Richard, PT, DPT 10/23/2022, 9:33 AM

## 2022-10-25 ENCOUNTER — Other Ambulatory Visit (INDEPENDENT_AMBULATORY_CARE_PROVIDER_SITE_OTHER): Payer: Self-pay | Admitting: Adult Health

## 2022-10-25 DIAGNOSIS — E88819 Insulin resistance, unspecified: Secondary | ICD-10-CM

## 2022-10-29 ENCOUNTER — Other Ambulatory Visit (INDEPENDENT_AMBULATORY_CARE_PROVIDER_SITE_OTHER): Payer: Self-pay | Admitting: Adult Health

## 2022-10-29 ENCOUNTER — Encounter (INDEPENDENT_AMBULATORY_CARE_PROVIDER_SITE_OTHER): Payer: Self-pay | Admitting: Adult Health

## 2022-10-29 ENCOUNTER — Ambulatory Visit (INDEPENDENT_AMBULATORY_CARE_PROVIDER_SITE_OTHER): Payer: 59 | Admitting: Adult Health

## 2022-10-29 VITALS — BP 126/82 | HR 58 | Temp 97.9°F | Ht 66.0 in | Wt 178.0 lb

## 2022-10-29 DIAGNOSIS — E669 Obesity, unspecified: Secondary | ICD-10-CM

## 2022-10-29 DIAGNOSIS — E88819 Insulin resistance, unspecified: Secondary | ICD-10-CM

## 2022-10-29 DIAGNOSIS — Z6828 Body mass index (BMI) 28.0-28.9, adult: Secondary | ICD-10-CM | POA: Diagnosis not present

## 2022-10-29 MED ORDER — METFORMIN HCL 500 MG PO TABS: ORAL_TABLET | ORAL | 0 refills | Status: DC

## 2022-10-30 NOTE — Progress Notes (Unsigned)
Chief Complaint:   OBESITY [name] is here to discuss her progress with her obesity treatment plan along with follow-up of her obesity related diagnoses. [name] is on keeping a food journal and adhering to recommended goals of 1200-1300 calories and 90 protein and states she is following her eating plan approximately 50% of the time. [name] states she is doing cardio 30 minutes 4 times per week.  Today's visit was #: 11 Starting weight: 199 lbs Starting date: 05/31/2020 Today's weight: 178 lbs Today's date: 10/29/2022 Total lbs lost to date: 21 lbs Total lbs lost since last in-office visit: +3 lbs  Interim History: Her goal weight is 160 lbs, her current weight is 178 lbs.  Subjective:   1. Insulin resistance Insulin level fluctuates between 7.5-11.2.  She is taking metformin 500 mg, 2 tablets in the evening.   Assessment/Plan:   1. Insulin resistance Continue metformin therapy.   Refill - metFORMIN (GLUCOPHAGE) 500 MG tablet; 2 tabs with supper  Dispense: 60 tablet; Refill: 0  2. Obesity with current BMI of 28.8 Handout:  Thanksgiving strategies.  Holiday recipes.   [name] is currently in the action stage of change. As such, her goal is to continue with weight loss efforts. She has agreed to keeping a food journal and adhering to recommended goals of 1200-1300 calories and 90 protein daily.   Exercise goals:  As is.   Behavioral modification strategies: increasing lean protein intake, decreasing simple carbohydrates, increasing water intake, decreasing eating out, no skipping meals, keeping healthy foods in the home, ways to avoid boredom eating, planning for success, and keeping a strict food journal.  [name] has agreed to follow-up with our clinic in 4 weeks. She was informed of the importance of frequent follow-up visits to maximize her success with intensive lifestyle modifications for her multiple health conditions.   Objective:   Blood pressure 126/82, pulse (!) 58,  temperature 97.9 F (36.6 C), height 5\' 6"  (1.676 m), weight 178 lb (80.7 kg), SpO2 100 %. Body mass index is 28.73 kg/m.  General: Cooperative, alert, well developed, in no acute distress. HEENT: Conjunctivae and lids unremarkable. Cardiovascular: Regular rhythm.  Lungs: Normal work of breathing. Neurologic: No focal deficits.   Lab Results  Component Value Date   CREATININE 0.86 07/30/2022   BUN 14 07/30/2022   NA 140 07/30/2022   K 4.4 07/30/2022   CL 101 07/30/2022   CO2 26 07/30/2022   Lab Results  Component Value Date   ALT 31 07/30/2022   AST 22 07/30/2022   ALKPHOS 80 07/30/2022   BILITOT <0.2 07/30/2022   Lab Results  Component Value Date   HGBA1C 5.1 04/09/2022   HGBA1C 5.0 11/16/2021   HGBA1C 5.3 06/21/2021   HGBA1C 5.0 02/21/2021   HGBA1C 5.0 05/31/2020   Lab Results  Component Value Date   INSULIN 7.5 04/09/2022   INSULIN 11.2 11/16/2021   INSULIN 10.6 06/21/2021   INSULIN 14.9 02/21/2021   INSULIN 17.8 10/24/2020   Lab Results  Component Value Date   TSH 1.420 07/30/2022   Lab Results  Component Value Date   CHOL 202 (H) 04/09/2022   HDL 61 04/09/2022   LDLCALC 125 (H) 04/09/2022   TRIG 90 04/09/2022   CHOLHDL 3.3 06/21/2021   Lab Results  Component Value Date   VD25OH 47.4 07/30/2022   VD25OH 36.4 04/09/2022   VD25OH 103.0 (H) 11/16/2021   Lab Results  Component Value Date   WBC 6.2 07/30/2022   HGB  13.5 07/30/2022   HCT 40.1 07/30/2022   MCV 88 07/30/2022   PLT 279 07/30/2022   Lab Results  Component Value Date   IRON 57 02/24/2019   FERRITIN 30.7 02/24/2019   Attestation Statements:   Reviewed by clinician on day of visit: allergies, medications, problem list, medical history, surgical history, family history, social history, and previous encounter notes.  I, Malcolm Metro, RMA, am acting as Energy manager for William Hamburger, NP.  I have reviewed the above documentation for accuracy and completeness, and I agree with the  above. -  ***

## 2022-11-20 ENCOUNTER — Encounter: Payer: Self-pay | Admitting: Internal Medicine

## 2022-11-20 ENCOUNTER — Other Ambulatory Visit: Payer: Self-pay | Admitting: Internal Medicine

## 2022-11-20 ENCOUNTER — Ambulatory Visit (INDEPENDENT_AMBULATORY_CARE_PROVIDER_SITE_OTHER): Payer: 59 | Admitting: Family Medicine

## 2022-11-20 ENCOUNTER — Encounter (INDEPENDENT_AMBULATORY_CARE_PROVIDER_SITE_OTHER): Payer: Self-pay | Admitting: Family Medicine

## 2022-11-20 VITALS — BP 118/86 | HR 75 | Temp 98.3°F | Ht 66.0 in | Wt 174.8 lb

## 2022-11-20 DIAGNOSIS — E88819 Insulin resistance, unspecified: Secondary | ICD-10-CM

## 2022-11-20 DIAGNOSIS — E669 Obesity, unspecified: Secondary | ICD-10-CM | POA: Diagnosis not present

## 2022-11-20 DIAGNOSIS — E041 Nontoxic single thyroid nodule: Secondary | ICD-10-CM

## 2022-11-20 DIAGNOSIS — Z6828 Body mass index (BMI) 28.0-28.9, adult: Secondary | ICD-10-CM | POA: Diagnosis not present

## 2022-11-20 DIAGNOSIS — E785 Hyperlipidemia, unspecified: Secondary | ICD-10-CM

## 2022-11-20 DIAGNOSIS — R7303 Prediabetes: Secondary | ICD-10-CM | POA: Diagnosis not present

## 2022-11-20 DIAGNOSIS — E039 Hypothyroidism, unspecified: Secondary | ICD-10-CM

## 2022-11-20 DIAGNOSIS — E7849 Other hyperlipidemia: Secondary | ICD-10-CM

## 2022-11-20 MED ORDER — METFORMIN HCL 500 MG PO TABS: ORAL_TABLET | ORAL | 0 refills | Status: DC

## 2022-11-27 ENCOUNTER — Ambulatory Visit
Admission: RE | Admit: 2022-11-27 | Discharge: 2022-11-27 | Disposition: A | Discharge status: Home / Self Care | Payer: 59 | Source: Ambulatory Visit | Attending: Internal Medicine | Admitting: Internal Medicine

## 2022-11-27 DIAGNOSIS — E041 Nontoxic single thyroid nodule: Secondary | ICD-10-CM

## 2022-11-29 ENCOUNTER — Other Ambulatory Visit (INDEPENDENT_AMBULATORY_CARE_PROVIDER_SITE_OTHER): Payer: Self-pay | Admitting: Family Medicine

## 2022-11-29 DIAGNOSIS — E88819 Insulin resistance, unspecified: Secondary | ICD-10-CM

## 2022-12-04 ENCOUNTER — Other Ambulatory Visit (INDEPENDENT_AMBULATORY_CARE_PROVIDER_SITE_OTHER): Payer: Self-pay

## 2022-12-18 NOTE — Progress Notes (Signed)
Chief Complaint:   OBESITY [name] is here to discuss her progress with her obesity treatment plan along with follow-up of her obesity related diagnoses. [name] is on keeping a food journal and adhering to recommended goals of 1200-1300 calories and 90 protein and states she is following her eating plan approximately 50% of the time. [name] states she is cardio 30-45 minutes 3  Today's visit was #: 41 Starting weight: 199 LBS Starting date: 05/31/2020 Today's weight: 174 LBS Today's date: 11/20/2022 Total lbs lost to date: 25 LBS Total lbs lost since last in-office visit: 4 LBS  Interim History: First office visit with me in 2 years.  Patient has been seeing [name]."  I am making better choices but difficult to be consistent with meal plan and stay on plan daily."  Patient lost 6.5 LB and fat mass and gained 2.5 LB in muscle mass, she did excellent.  She is still exercising.  Subjective:   1. Prediabetes [name] has a diagnosis of prediabetes based on her elevated HgA1c and was informed this puts her at greater risk of developing diabetes. She continues to work on diet and exercise to decrease her risk of diabetes. She denies nausea or hypoglycemia.  Patient is tolerating medication well, she denies any GI side effects.  Last A1c on 04/09/2022 was 5.1.  Cravings are controlled she denies any issues or concerns.  2. Other hyperlipidemia Last check with LDL, greater than 120, about 8 months ago.  Assessment/Plan:  No orders of the defined types were placed in this encounter.   Medications Discontinued During This Encounter  Medication Reason   metFORMIN (GLUCOPHAGE) 500 MG tablet Reorder     Meds ordered this encounter  Medications   metFORMIN (GLUCOPHAGE) 500 MG tablet    Sig: 2 tabs with supper    Dispense:  60 tablet    Refill:  0     1. Prediabetes Check fasting insulin and A1c next office visit.  Refill- metFORMIN (GLUCOPHAGE) 500 MG tablet; 2 tabs with supper  Dispense:  60 tablet; Refill: 0  2. Other hyperlipidemia Check fasting lipid panel next office visit, continue PNP and exercise.  3. Obesity with current BMI of 28.2 [name] eating diet discussed and handout provided.  [name] is currently in the action stage of change. As such, her goal is to continue with weight loss efforts. She has agreed to keeping a food journal and adhering to recommended goals of 1200-1300 calories and 90 protein.   Exercise goals:  As is.  Behavioral modification strategies: increasing lean protein intake, decreasing simple carbohydrates, and [name] eating strategies .  [name] has agreed to follow-up with our clinic in 4 weeks. She was informed of the importance of frequent follow-up visits to maximize her success with intensive lifestyle modifications for her multiple health conditions.   Objective:   Blood pressure 118/86, pulse 75, temperature 98.3 F (36.8 C), height 5\' 6"  (1.676 m), weight 174 lb 12.8 oz (79.3 kg), SpO2 98 %. Body mass index is 28.21 kg/m.  General: Cooperative, alert, well developed, in no acute distress. HEENT: Conjunctivae and lids unremarkable. Cardiovascular: Regular rhythm.  Lungs: Normal work of breathing. Neurologic: No focal deficits.   Lab Results  Component Value Date   CREATININE 0.86 07/30/2022   BUN 14 07/30/2022   NA 140 07/30/2022   K 4.4 07/30/2022   CL 101 07/30/2022   CO2 26 07/30/2022   Lab Results  Component Value Date   ALT 31 07/30/2022  AST 22 07/30/2022   ALKPHOS 80 07/30/2022   BILITOT <0.2 07/30/2022   Lab Results  Component Value Date   HGBA1C 5.1 04/09/2022   HGBA1C 5.0 11/16/2021   HGBA1C 5.3 06/21/2021   HGBA1C 5.0 02/21/2021   HGBA1C 5.0 05/31/2020   Lab Results  Component Value Date   INSULIN 7.5 04/09/2022   INSULIN 11.2 11/16/2021   INSULIN 10.6 06/21/2021   INSULIN 14.9 02/21/2021   INSULIN 17.8 10/24/2020   Lab Results  Component Value Date   TSH 1.420 07/30/2022   Lab Results   Component Value Date   CHOL 202 (H) 04/09/2022   HDL 61 04/09/2022   LDLCALC 125 (H) 04/09/2022   TRIG 90 04/09/2022   CHOLHDL 3.3 06/21/2021   Lab Results  Component Value Date   VD25OH 47.4 07/30/2022   VD25OH 36.4 04/09/2022   VD25OH 103.0 (H) 11/16/2021   Lab Results  Component Value Date   WBC 6.2 07/30/2022   HGB 13.5 07/30/2022   HCT 40.1 07/30/2022   MCV 88 07/30/2022   PLT 279 07/30/2022   Lab Results  Component Value Date   IRON 57 02/24/2019   FERRITIN 30.7 02/24/2019   Attestation Statements:   Reviewed by clinician on day of visit: allergies, medications, problem list, medical history, surgical history, family history, social history, and previous encounter notes.  I, [name], RMA, am acting as Energy manager for [employer], DO.   I have reviewed the above documentation for accuracy and completeness, and I agree with the above. [name], D.O.  The 21st Century Cures Act was signed into law in 2016 which includes the topic of electronic health records.  This provides immediate access to information in MyChart.  This includes consultation notes, operative notes, office notes, lab results and pathology reports.  If you have any questions about what you read please let us know at your next visit so we can discuss your concerns and take corrective action if need be.  We are right here with you.

## 2022-12-29 ENCOUNTER — Other Ambulatory Visit (INDEPENDENT_AMBULATORY_CARE_PROVIDER_SITE_OTHER): Payer: Self-pay | Admitting: Adult Health

## 2022-12-29 DIAGNOSIS — F3289 Other specified depressive episodes: Secondary | ICD-10-CM

## 2023-01-03 ENCOUNTER — Ambulatory Visit (INDEPENDENT_AMBULATORY_CARE_PROVIDER_SITE_OTHER): Payer: 59 | Admitting: Family Medicine

## 2023-01-07 ENCOUNTER — Telehealth (INDEPENDENT_AMBULATORY_CARE_PROVIDER_SITE_OTHER): Payer: Self-pay | Admitting: Family Medicine

## 2023-01-07 ENCOUNTER — Other Ambulatory Visit (INDEPENDENT_AMBULATORY_CARE_PROVIDER_SITE_OTHER): Payer: Self-pay | Admitting: Family Medicine

## 2023-01-07 ENCOUNTER — Ambulatory Visit (INDEPENDENT_AMBULATORY_CARE_PROVIDER_SITE_OTHER): Payer: 59 | Admitting: Family Medicine

## 2023-01-07 ENCOUNTER — Encounter (INDEPENDENT_AMBULATORY_CARE_PROVIDER_SITE_OTHER): Payer: Self-pay | Admitting: Family Medicine

## 2023-01-07 VITALS — BP 130/84 | HR 75 | Temp 97.9°F | Ht 66.0 in | Wt 181.0 lb

## 2023-01-07 DIAGNOSIS — E559 Vitamin D deficiency, unspecified: Secondary | ICD-10-CM | POA: Diagnosis not present

## 2023-01-07 DIAGNOSIS — F3289 Other specified depressive episodes: Secondary | ICD-10-CM | POA: Diagnosis not present

## 2023-01-07 DIAGNOSIS — R7303 Prediabetes: Secondary | ICD-10-CM | POA: Diagnosis not present

## 2023-01-07 DIAGNOSIS — Z6829 Body mass index (BMI) 29.0-29.9, adult: Secondary | ICD-10-CM

## 2023-01-07 DIAGNOSIS — E7849 Other hyperlipidemia: Secondary | ICD-10-CM

## 2023-01-07 DIAGNOSIS — E669 Obesity, unspecified: Secondary | ICD-10-CM

## 2023-01-07 MED ORDER — METFORMIN HCL 500 MG PO TABS: ORAL_TABLET | ORAL | 0 refills | Status: DC

## 2023-01-07 MED ORDER — BUPROPION HCL ER (SR) 150 MG PO TB12: 150.0000 mg | ORAL_TABLET | Freq: Two times a day (BID) | ORAL | 0 refills | Status: DC

## 2023-01-07 NOTE — Telephone Encounter (Signed)
90 supply sent in per Dr. Jenetta Downer approval. Tried to contact pt to let her know, no answer/vm full.

## 2023-01-07 NOTE — Telephone Encounter (Signed)
Patient stated her pharmacy won't approve the 60 day supply of Wellbutrin. They will approve if it was a 90 day supply. Please discuss with patient

## 2023-01-08 LAB — VITAMIN D 25 HYDROXY (VIT D DEFICIENCY, FRACTURES): Vit D, 25-Hydroxy: 30.9 ng/mL (ref 30.0–100.0)

## 2023-01-08 LAB — COMPREHENSIVE METABOLIC PANEL
ALT: 35 IU/L — ABNORMAL HIGH (ref 0–32)
AST: 22 IU/L (ref 0–40)
Albumin/Globulin Ratio: 2 (ref 1.2–2.2)
Albumin: 4.7 g/dL (ref 3.9–4.9)
Alkaline Phosphatase: 89 IU/L (ref 44–121)
BUN/Creatinine Ratio: 13 (ref 9–23)
BUN: 11 mg/dL (ref 6–20)
Bilirubin Total: <0.2 mg/dL (ref 0.0–1.2)
CO2: 25 mmol/L (ref 20–29)
Calcium: 9.3 mg/dL (ref 8.7–10.2)
Chloride: 100 mmol/L (ref 96–106)
Creatinine, Ser: 0.85 mg/dL (ref 0.57–1.00)
Globulin, Total: 2.3 g/dL (ref 1.5–4.5)
Glucose: 71 mg/dL (ref 70–99)
Potassium: 4.5 mmol/L (ref 3.5–5.2)
Sodium: 141 mmol/L (ref 134–144)
Total Protein: 7 g/dL (ref 6.0–8.5)
eGFR: 93 mL/min/{1.73_m2} (ref >59)

## 2023-01-08 LAB — INSULIN, RANDOM: INSULIN: 15.6 u[IU]/mL (ref 2.6–24.9)

## 2023-01-08 LAB — LIPID PANEL
Chol/HDL Ratio: 3.2 ratio (ref 0.0–4.4)
Cholesterol, Total: 260 mg/dL — ABNORMAL HIGH (ref 100–199)
HDL: 82 mg/dL (ref >39)
LDL Chol Calc (NIH): 159 mg/dL — ABNORMAL HIGH (ref 0–99)
Triglycerides: 108 mg/dL (ref 0–149)
VLDL Cholesterol Cal: 19 mg/dL (ref 5–40)

## 2023-01-08 LAB — HEMOGLOBIN A1C
Est. average glucose Bld gHb Est-mCnc: 97 mg/dL
Hgb A1c MFr Bld: 5 % (ref 4.8–5.6)

## 2023-01-08 LAB — VITAMIN B12: Vitamin B-12: 1014 pg/mL (ref 232–1245)

## 2023-01-22 ENCOUNTER — Ambulatory Visit (INDEPENDENT_AMBULATORY_CARE_PROVIDER_SITE_OTHER): Payer: 59 | Admitting: Family Medicine

## 2023-01-22 ENCOUNTER — Encounter (INDEPENDENT_AMBULATORY_CARE_PROVIDER_SITE_OTHER): Payer: Self-pay | Admitting: Family Medicine

## 2023-01-22 VITALS — BP 144/86 | HR 64 | Temp 98.1°F | Ht 66.0 in | Wt 179.0 lb

## 2023-01-22 DIAGNOSIS — E669 Obesity, unspecified: Secondary | ICD-10-CM

## 2023-01-22 DIAGNOSIS — Z6828 Body mass index (BMI) 28.0-28.9, adult: Secondary | ICD-10-CM

## 2023-01-22 DIAGNOSIS — R7303 Prediabetes: Secondary | ICD-10-CM | POA: Diagnosis not present

## 2023-01-22 DIAGNOSIS — R7989 Other specified abnormal findings of blood chemistry: Secondary | ICD-10-CM | POA: Diagnosis not present

## 2023-01-22 DIAGNOSIS — F3289 Other specified depressive episodes: Secondary | ICD-10-CM

## 2023-01-22 DIAGNOSIS — E7841 Elevated Lipoprotein(a): Secondary | ICD-10-CM

## 2023-01-22 DIAGNOSIS — E559 Vitamin D deficiency, unspecified: Secondary | ICD-10-CM | POA: Diagnosis not present

## 2023-01-22 DIAGNOSIS — E7849 Other hyperlipidemia: Secondary | ICD-10-CM | POA: Diagnosis not present

## 2023-01-22 MED ORDER — BUPROPION HCL ER (SR) 150 MG PO TB12: 150.0000 mg | ORAL_TABLET | Freq: Two times a day (BID) | ORAL | 0 refills | Status: DC

## 2023-01-22 MED ORDER — D-3-5 125 MCG (5000 UT) PO CAPS: 5000.0000 [IU] | ORAL_CAPSULE | Freq: Every day | ORAL | Status: DC

## 2023-01-24 NOTE — Progress Notes (Signed)
Chief Complaint:   OBESITY [name] is here to discuss her progress with her obesity treatment plan along with follow-up of her obesity related diagnoses. [name] is on keeping a food journal and adhering to recommended goals of 1300-1300 calories and 90 protein and states she is following her eating plan approximately 0% of the time. [name] states she strength and cardio cardio 60 minutes 2 times per week.  Today's visit was #: 14 Starting weight: 199 LBS Starting date: 05/31/2020 Today's weight: 181 LBS Today's date: 01/07/2023 Total lbs lost to date: 18 LBS Total lbs lost since last in-office visit: +7 LBS  Interim History: " I have been eating what I wanted to the past 1 to 2 months."  First office visit with me in several groups, she has been seen our APPs.  Patient feels she stuck for the past year.  11/22, patient was 177 pounds and now 181 pounds.  She is disappointed.  Patient denies side effects or anything in the past.  Patient has a strong faith in the church support system.  Subjective:   1. Other hyperlipidemia Last fasting lipid panel on 4/23 with LDL at 125.  Patient has been trying to increase lean protein  and animal fats.  2. Prediabetes Patient has not been consistent with metformin always taking down at bedtime.  Little help with cravings, hunger during the day.  3. Vitamin D deficiency She only takes OTC supplement via MVI and what is added to her foods now.   4. Other depression with emotional eating Patient is tearful today.  She is struggling with some past traumas and how they relate to her weight loss journey.  No counseling but has great support sytem.  Positive for feeling tired a lot, positive for emotional eating.    Assessment/Plan:   Orders Placed This Encounter  Procedures   Hemoglobin A1c   VITAMIN D 25 Hydroxy (Vit-D Deficiency, Fractures)   Comprehensive metabolic panel   Vitamin B34   Lipid panel   Insulin, random    Medications  Discontinued During This Encounter  Medication Reason   buPROPion (WELLBUTRIN SR) 150 MG 12 hr tablet Reorder   metFORMIN (GLUCOPHAGE) 500 MG tablet Reorder   metFORMIN (GLUCOPHAGE) 500 MG tablet Reorder     Meds ordered this encounter  Medications   DISCONTD: buPROPion (WELLBUTRIN SR) 150 MG 12 hr tablet    Sig: Take 1 tablet (150 mg total) by mouth 2 (two) times daily.    Dispense:  60 tablet    Refill:  0   DISCONTD: metFORMIN (GLUCOPHAGE) 500 MG tablet    Sig: 2 tabs with lunch    Dispense:  60 tablet    Refill:  0   metFORMIN (GLUCOPHAGE) 500 MG tablet    Sig: 2 tabs with lunch    Dispense:  180 tablet    Refill:  0     1. Other hyperlipidemia Check labs today.  Increase exercise as tolerated.  Continue to decrease saturated and trans fats via PNP.  - Lipid panel - CMP  2. Prediabetes Change dose to lunch time and not evening time. Check labs today.  May take ine tablet at lunch, one tablet at dinner if desired.  She will see what works best for her. Continue with PNP and decrease simple carbs, increase exercise.   - Hemoglobin A1c - Comprehensive metabolic panel - Insulin, random - metFORMIN (GLUCOPHAGE) 500 MG tablet; 2 tabs with lunch  Dispense: 180 tablet; Refill: 0  3. Vitamin D deficiency Check Vitamin D level today, was 36.4 in 04/23 and 47 on 07/23.  Continue same supplement  OTC dose.  Will follow up with labs.   - VITAMIN D 25 Hydroxy (Vit-D Deficiency, Fractures)  4. Other depression with emotional eating Increase Wellbutrin to BID from once daily and refill after risk and benefits discussed with patient and she agrees.  Save list of local counselors and also "Christian based ones per patient preference.  Check labs today.   - Vitamin B12  5. Obesity with current BMI of 29.2 [name] is currently in the action stage of change. As such, her goal is to continue with weight loss efforts. She has agreed to keeping a food journal and adhering to recommended  goals of 1200-1300 calories and 90 protein.   Exercise goals:  Increase activity as tolerated.  Behavioral modification strategies: increasing lean protein intake, decreasing simple carbohydrates, and emotional eating strategies.  [name] has agreed to follow-up with our clinic in 2-3 weeks. She was informed of the importance of frequent follow-up visits to maximize her success with intensive lifestyle modifications for her multiple health conditions.   Objective:   Blood pressure 130/84, pulse 75, temperature 97.9 F (36.6 C), height 5\' 6"  (1.676 m), weight 181 lb (82.1 kg), SpO2 100 %. Body mass index is 29.21 kg/m.  General: Cooperative, alert, well developed, in no acute distress. HEENT: Conjunctivae and lids unremarkable. Cardiovascular: Regular rhythm.  Lungs: Normal work of breathing. Neurologic: No focal deficits.   Lab Results  Component Value Date   CREATININE 0.85 01/07/2023   BUN 11 01/07/2023   NA 141 01/07/2023   K 4.5 01/07/2023   CL 100 01/07/2023   CO2 25 01/07/2023   Lab Results  Component Value Date   ALT 35 (H) 01/07/2023   AST 22 01/07/2023   ALKPHOS 89 01/07/2023   BILITOT <0.2 01/07/2023   Lab Results  Component Value Date   HGBA1C 5.0 01/07/2023   HGBA1C 5.1 04/09/2022   HGBA1C 5.0 11/16/2021   HGBA1C 5.3 06/21/2021   HGBA1C 5.0 02/21/2021   Lab Results  Component Value Date   INSULIN 15.6 01/07/2023   INSULIN 7.5 04/09/2022   INSULIN 11.2 11/16/2021   INSULIN 10.6 06/21/2021   INSULIN 14.9 02/21/2021   Lab Results  Component Value Date   TSH 1.420 07/30/2022   Lab Results  Component Value Date   CHOL 260 (H) 01/07/2023   HDL 82 01/07/2023   LDLCALC 159 (H) 01/07/2023   TRIG 108 01/07/2023   CHOLHDL 3.2 01/07/2023   Lab Results  Component Value Date   VD25OH 30.9 01/07/2023   VD25OH 47.4 07/30/2022   VD25OH 36.4 04/09/2022   Lab Results  Component Value Date   WBC 6.2 07/30/2022   HGB 13.5 07/30/2022   HCT 40.1  07/30/2022   MCV 88 07/30/2022   PLT 279 07/30/2022   Lab Results  Component Value Date   IRON 57 02/24/2019   FERRITIN 30.7 02/24/2019   Attestation Statements:   Reviewed by clinician on day of visit: allergies, medications, problem list, medical history, surgical history, family history, social history, and previous encounter notes.  I, Davy Pique, RMA, am acting as Location manager for [employer], DO.   I have reviewed the above documentation for accuracy and completeness, and I agree with the above. Marjory Sneddon, D.O.  The [place] was signed into law in 2016 which includes the topic of electronic health records.  This  provides immediate access to information in MyChart.  This includes consultation notes, operative notes, office notes, lab results and pathology reports.  If you have any questions about what you read please let us know at your next visit so we can discuss your concerns and take corrective action if need be.  We are right here with you.

## 2023-02-02 ENCOUNTER — Ambulatory Visit: Admit: 2023-02-02 | Payer: 59

## 2023-02-07 NOTE — Progress Notes (Signed)
Chief Complaint:   OBESITY [name] is here to discuss her progress with her obesity treatment plan along with follow-up of her obesity related diagnoses. [name] is on keeping a food journal and adhering to recommended goals of 1200-1300 calories and 90 protein and states she is following her eating plan approximately 65% of the time. [name] states she is doing cardio and strength training 60 minutes 3-4 times per week.  Today's visit was #: 74 Starting weight: 199 lbs Starting date: 05/31/2020 Today's weight: 179 lbs Today's date: 01/22/2023 Total lbs lost to date: 20 LBS Total lbs lost since last in-office visit: 2 LBS  Interim History: Patient traveled to [place] the past weekend.  She is happy with her 2 LBS of weight loss.  Patient states that God directed her to fruit if she felt like snacking.  The left emotional eating.  Average calories per day are 1100 and 50 g of protein per day.  Subjective:   1. Prediabetes Worsening.  Discussed labs with patient today. Fasting insulin 15.6 prior.  Last office visit change metformin to mornings with breakfast, this helped a lot more energy and less cravings.  Patient is tolerating well with no side effects.  2. Vitamin D deficiency Worsening.  Discussed labs with patient today. Patient is positive for fatigue.  Patient has not been on any vitamin D in a long while.  3. Elevated LFTs New diagnosis.  Discussed labs with patient today. Increased ALT slightly.  4. Other hyperlipidemia, with elevated lipoprotein Worsening. Discussed labs with patient today. LDL worse from 125 to 159.  Patient did a worse at the end of last year, she ate out more.  5. Other depression with emotional eating Discussed labs with patient today. Patient was not able to increase dose yet.  Tolerating well with no side effects.  Assessment/Plan:  No orders of the defined types were placed in this encounter.   Medications Discontinued During This Encounter   Medication Reason   buPROPion (WELLBUTRIN SR) 150 MG 12 hr tablet Reorder     Meds ordered this encounter  Medications   buPROPion (WELLBUTRIN SR) 150 MG 12 hr tablet    Sig: Take 1 tablet (150 mg total) by mouth 2 (two) times daily.    Dispense:  180 tablet    Refill:  0   Cholecalciferol (D-3-5) 125 MCG (5000 UT) capsule    Sig: Take 1 capsule (5,000 Units total) by mouth daily.     1. Prediabetes Labs discussed today, worsening fasting insulin.  A1c is within normal limits.  History of prediabetes however she has been here, A1c never at 5.7 or more.  Continue PNP, decrease simple carbs, and continue weight loss.  2. Vitamin D deficiency Vitamin D too low and worse, prior 47.4 now it is 30.9.  Start OTC- Cholecalciferol (D-3-5) 125 MCG (5000 UT) capsule; Take 1 capsule (5,000 Units total) by mouth daily.  3. Elevated LFTs Likely NAFLD.  Counseling on foods done and patient will look to lose 5% to 7% of her weight.  Follow PNP.  4. Other hyperlipidemia, with elevated lipoprotein Decrease saturated and trans fats.  Reviewed with patient with foods containing high amounts.  Disease counseling done.  Follow PNP and increase exercise as tolerated.  5. Other depression with emotional eating Refill Wellbutrin and increase dose.  Doing 90-day supply.  Risk and benefits of medication discussed with patient continue prayer and exercising, etc.  Did not look into counseling yet.  Vitamin B12  within normal limits.  Increase- buPROPion (WELLBUTRIN SR) 150 MG 12 hr tablet; Take 1 tablet (150 mg total) by mouth 2 (two) times daily.  Dispense: 180 tablet; Refill: 0  6. Obesity with current BMI of 28.9 [name] is currently in the action stage of change. As such, her goal is to continue with weight loss efforts. She has agreed to the Category 2 Plan or keeping a food journal and adhering to recommended goals of 1200-1300 calories and 90+ protein daily..   Exercise goals:  As is.  Behavioral  modification strategies: increasing lean protein intake, decreasing simple carbohydrates, decreasing eating out, and avoiding temptations.  [name] has agreed to follow-up with our clinic in 3 weeks. She was informed of the importance of frequent follow-up visits to maximize her success with intensive lifestyle modifications for her multiple health conditions.   Objective:   Blood pressure (!) 144/86, pulse 64, temperature 98.1 F (36.7 C), height 5' 6"$  (1.676 m), SpO2 100 %. Body mass index is 29.21 kg/m.  General: Cooperative, alert, well developed, in no acute distress. HEENT: Conjunctivae and lids unremarkable. Cardiovascular: Regular rhythm.  Lungs: Normal work of breathing. Neurologic: No focal deficits.   Lab Results  Component Value Date   CREATININE 0.85 01/07/2023   BUN 11 01/07/2023   NA 141 01/07/2023   K 4.5 01/07/2023   CL 100 01/07/2023   CO2 25 01/07/2023   Lab Results  Component Value Date   ALT 35 (H) 01/07/2023   AST 22 01/07/2023   ALKPHOS 89 01/07/2023   BILITOT <0.2 01/07/2023   Lab Results  Component Value Date   HGBA1C 5.0 01/07/2023   HGBA1C 5.1 04/09/2022   HGBA1C 5.0 11/16/2021   HGBA1C 5.3 06/21/2021   HGBA1C 5.0 02/21/2021   Lab Results  Component Value Date   INSULIN 15.6 01/07/2023   INSULIN 7.5 04/09/2022   INSULIN 11.2 11/16/2021   INSULIN 10.6 06/21/2021   INSULIN 14.9 02/21/2021   Lab Results  Component Value Date   TSH 1.420 07/30/2022   Lab Results  Component Value Date   CHOL 260 (H) 01/07/2023   HDL 82 01/07/2023   LDLCALC 159 (H) 01/07/2023   TRIG 108 01/07/2023   CHOLHDL 3.2 01/07/2023   Lab Results  Component Value Date   VD25OH 30.9 01/07/2023   VD25OH 47.4 07/30/2022   VD25OH 36.4 04/09/2022   Lab Results  Component Value Date   WBC 6.2 07/30/2022   HGB 13.5 07/30/2022   HCT 40.1 07/30/2022   MCV 88 07/30/2022   PLT 279 07/30/2022   Lab Results  Component Value Date   IRON 57 02/24/2019   FERRITIN  30.7 02/24/2019   Attestation Statements:   Reviewed by clinician on day of visit: allergies, medications, problem list, medical history, surgical history, family history, social history, and previous encounter notes.  I, Davy Pique, RMA, am acting as Location manager for [employer], DO.   I have reviewed the above documentation for accuracy and completeness, and I agree with the above. Marjory Sneddon, D.O.  The [place] was signed into law in 2016 which includes the topic of electronic health records.  This provides immediate access to information in MyChart.  This includes consultation notes, operative notes, office notes, lab results and pathology reports.  If you have any questions about what you read please let us know at your next visit so we can discuss your concerns and take corrective action if need be.  We are right  here with you.

## 2023-02-11 ENCOUNTER — Telehealth (INDEPENDENT_AMBULATORY_CARE_PROVIDER_SITE_OTHER): Payer: 59 | Admitting: Family Medicine

## 2023-02-11 ENCOUNTER — Encounter (INDEPENDENT_AMBULATORY_CARE_PROVIDER_SITE_OTHER): Payer: Self-pay | Admitting: Family Medicine

## 2023-02-11 DIAGNOSIS — F3289 Other specified depressive episodes: Secondary | ICD-10-CM | POA: Diagnosis not present

## 2023-02-11 DIAGNOSIS — Z6829 Body mass index (BMI) 29.0-29.9, adult: Secondary | ICD-10-CM

## 2023-02-11 DIAGNOSIS — R7303 Prediabetes: Secondary | ICD-10-CM

## 2023-02-11 DIAGNOSIS — E669 Obesity, unspecified: Secondary | ICD-10-CM | POA: Diagnosis not present

## 2023-02-11 NOTE — Progress Notes (Signed)
TeleHealth Visit:  This visit was completed with telemedicine (audio/video) technology. [name] has verbally consented to this TeleHealth visit. The patient is located at home, the provider is located at home. The participants in this visit include the listed provider and patient. The visit was conducted today via MyChart video.  OBESITY [name] is here to discuss her progress with her obesity treatment plan along with follow-up of her obesity related diagnoses.   Today's visit was # 38 Starting weight: 199 lbs Starting date: 05/31/2020 Weight at last in office visit: 179 lbs on 01/22/23 Total weight loss: 20 lbs at last in office visit on 01/22/23. Today's reported weight:  No weight reported.  Nutrition Plan: the Category 2 Plan and keeping a food journal and adhering to recommended goals of 1200-1300 calories and 90 gms protein daily.  Current exercise:  inconsistent exercise over last few weeks. Gym-cardio and strength training 60 minutes only 2-3 times in last 2 weeks.  Interim History:  [name] recently visited her sister in [place] and returned last week.  Her sister is vegan and [name] noticed she did not get in much protein.  She has journaled inconsistently and overall has not been on the meal plan much since the holidays. She drinks mostly water. Has been off of her usual exercise routine.  When she takes the bupropion and metformin at breakfast she is not hungry for lunch.  She notices that compliance with these medicines is erratic because of the effects on her appetite.  Assessment/Plan:  1. Prediabetes Last A1c was 5.0.  Fasting insulin 15.6. Medication(s): Metformin 500 mg, 2 tablets at lunch. She is not taking this as prescribed.  Taking 1 tablet at breakfast but often leaving off the lunch dose because of lack of appetite. Lab Results  Component Value Date   HGBA1C 5.0 01/07/2023   Lab Results  Component Value Date   INSULIN 15.6 01/07/2023   INSULIN 7.5  04/09/2022   INSULIN 11.2 11/16/2021   INSULIN 10.6 06/21/2021   INSULIN 14.9 02/21/2021    Plan: Discontinue metformin for now. Continue bupropion 150 mg 1 pill in a.m.  If she notices polyphagia after 1 week, take bupropion 1 pill in a.m. and metformin 1 pill at lunch.  2. .Other depression with emotional eating Prescribed bupropion 150 mg twice daily.  Currently taking bupropion 150 mg in the morning with metformin 500 mg. Has no appetite at lunch.  Plan: Continue bupropion 150 mg every a.m.  Continue to leave off second dose.  3. Obesity: Current BMI 28 [name] is currently in the action stage of change. As such, her goal is to continue with weight loss efforts.  She has agreed to the Category 2 Plan.   Exercise goals: Resume usual routine of cardio and resistance 3-4 times per week  Behavioral modification strategies: increasing lean protein intake, decreasing simple carbohydrates, and planning for success.  [name] has agreed to follow-up with our clinic in 3 weeks.   No orders of the defined types were placed in this encounter.   There are no discontinued medications.   No orders of the defined types were placed in this encounter.     Objective:   VITALS: Per patient if applicable, see vitals. GENERAL: Alert and in no acute distress. CARDIOPULMONARY: No increased WOB. Speaking in clear sentences.  PSYCH: Pleasant and cooperative. Speech normal rate and rhythm. Affect is appropriate. Insight and judgement are appropriate. Attention is focused, linear, and appropriate.  NEURO: Oriented as arrived to appointment on  time with no prompting.   Lab Results  Component Value Date   CREATININE 0.85 01/07/2023   BUN 11 01/07/2023   NA 141 01/07/2023   K 4.5 01/07/2023   CL 100 01/07/2023   CO2 25 01/07/2023   Lab Results  Component Value Date   ALT 35 (H) 01/07/2023   AST 22 01/07/2023   ALKPHOS 89 01/07/2023   BILITOT <0.2 01/07/2023   Lab Results  Component Value  Date   HGBA1C 5.0 01/07/2023   HGBA1C 5.1 04/09/2022   HGBA1C 5.0 11/16/2021   HGBA1C 5.3 06/21/2021   HGBA1C 5.0 02/21/2021   Lab Results  Component Value Date   INSULIN 15.6 01/07/2023   INSULIN 7.5 04/09/2022   INSULIN 11.2 11/16/2021   INSULIN 10.6 06/21/2021   INSULIN 14.9 02/21/2021   Lab Results  Component Value Date   TSH 1.420 07/30/2022   Lab Results  Component Value Date   CHOL 260 (H) 01/07/2023   HDL 82 01/07/2023   LDLCALC 159 (H) 01/07/2023   TRIG 108 01/07/2023   CHOLHDL 3.2 01/07/2023   Lab Results  Component Value Date   WBC 6.2 07/30/2022   HGB 13.5 07/30/2022   HCT 40.1 07/30/2022   MCV 88 07/30/2022   PLT 279 07/30/2022   Lab Results  Component Value Date   IRON 57 02/24/2019   FERRITIN 30.7 02/24/2019   Lab Results  Component Value Date   VD25OH 30.9 01/07/2023   VD25OH 47.4 07/30/2022   VD25OH 36.4 04/09/2022    Attestation Statements:   Reviewed by clinician on day of visit: allergies, medications, problem list, medical history, surgical history, family history, social history, and previous encounter notes.  Time spent on visit including the items listed below was 30 minutes.  -preparing to see the patient (e.g., review of tests, history, previous notes) -obtaining and/or reviewing separately obtained history -counseling and educating the patient/family/caregiver -documenting clinical information in the electronic or other health record -ordering medications, tests, or procedures -independently interpreting results and communicating results to the patient/ family/caregiver -referring and communicating with other health care professionals  -care coordination   This was prepared with the assistance of [employer].  Occasional wrong-word or sound-a-like substitutions may have occurred due to the inherent limitations of voice recognition software.

## 2023-02-21 ENCOUNTER — Encounter: Payer: Self-pay | Admitting: Family Medicine

## 2023-02-21 ENCOUNTER — Ambulatory Visit (INDEPENDENT_AMBULATORY_CARE_PROVIDER_SITE_OTHER): Payer: 59 | Admitting: Family Medicine

## 2023-02-21 VITALS — BP 130/78 | HR 77 | Temp 97.2°F | Ht 65.0 in | Wt 184.5 lb

## 2023-02-21 DIAGNOSIS — G43009 Migraine without aura, not intractable, without status migrainosus: Secondary | ICD-10-CM | POA: Diagnosis not present

## 2023-02-21 DIAGNOSIS — R42 Dizziness and giddiness: Secondary | ICD-10-CM

## 2023-02-21 DIAGNOSIS — M542 Cervicalgia: Secondary | ICD-10-CM

## 2023-02-21 MED ORDER — CYCLOBENZAPRINE HCL 10 MG PO TABS: 10.0000 mg | ORAL_TABLET | Freq: Three times a day (TID) | ORAL | 0 refills | Status: DC | PRN

## 2023-02-21 MED ORDER — SUMATRIPTAN SUCCINATE 100 MG PO TABS: 100.0000 mg | ORAL_TABLET | ORAL | 0 refills | Status: DC | PRN

## 2023-02-21 NOTE — Progress Notes (Signed)
Patient ID: [name], female    DOB: 04-14-1991, 32 y.o.   MRN: [id number]  This visit was conducted in person.  BP 130/78   Pulse 77   Temp (!) 97.2 F (36.2 C) (Temporal)   Ht 5' 5"$  (1.651 m)   Wt 184 lb 8 oz (83.7 kg)   LMP  (Within Years)   SpO2 98%   BMI 30.70 kg/m    CC:  Chief Complaint  Patient presents with   Neck Pain    C/o L side neck pain. Started about 1.5 wks ago after a workout. Also, c/o vertigo and nausea.    Subjective:   HPI: [name] is a 32 y.o. female presenting on 02/21/2023 for Neck Pain (C/o L side neck pain. Started about 1.5 wks ago after a workout. Also, c/o vertigo and nausea.)  Previous PCP: Einar Pheasant  She reports new onset pain in neck on left lateral side.  Occurred after  a work out 1.5 weeks ago... shoulder work out. Notes tense tight feeling in left neck laterally into upper shoulder.  Trouble getting comfortable at night  No radiation of pain into ahnd. No new numbness, no new weakness.    She now is having vertigo ( feels off balance) and nausea.Marland Kitchen seems triggered.  Has pressure in ears Bilaterally. Has a migraine , left temple   No cold symptoms, no allergy symptoms. No fever.  Has history of neck muscle spasm.  Has history of motion sickness and migraine... usually when driving.        Relevant past medical, surgical, family and social history reviewed and updated as indicated. Interim medical history since our last visit reviewed. Allergies and medications reviewed and updated. Outpatient Medications Prior to Visit  Medication Sig Dispense Refill   buPROPion (WELLBUTRIN SR) 150 MG 12 hr tablet Take 1 tablet (150 mg total) by mouth 2 (two) times daily. 180 tablet 0   levothyroxine (SYNTHROID) 50 MCG tablet Take 1 tablet (50 mcg total) by mouth daily. 90 tablet 0   metFORMIN (GLUCOPHAGE) 500 MG tablet 2 tabs with lunch 180 tablet 0   Cholecalciferol (D-3-5) 125 MCG (5000 UT) capsule Take 1 capsule (5,000 Units total) by  mouth daily. (Patient not taking: Reported on 02/21/2023)     No facility-administered medications prior to visit.     Per HPI unless specifically indicated in ROS section below Review of Systems  Constitutional:  Negative for fatigue and fever.  HENT:  Negative for congestion.   Eyes:  Negative for pain.  Respiratory:  Negative for cough and shortness of breath.   Cardiovascular:  Negative for chest pain, palpitations and leg swelling.  Gastrointestinal:  Negative for abdominal pain.  Genitourinary:  Negative for dysuria and vaginal bleeding.  Musculoskeletal:  Negative for back pain.  Neurological:  Positive for headaches. Negative for syncope and light-headedness.  Psychiatric/Behavioral:  Negative for dysphoric mood.    Objective:  BP 130/78   Pulse 77   Temp (!) 97.2 F (36.2 C) (Temporal)   Ht 5' 5"$  (1.651 m)   Wt 184 lb 8 oz (83.7 kg)   LMP  (Within Years)   SpO2 98%   BMI 30.70 kg/m   Wt Readings from Last 3 Encounters:  02/21/23 184 lb 8 oz (83.7 kg)  01/22/23 179 lb (81.2 kg)  01/07/23 181 lb (82.1 kg)      Physical Exam Constitutional:      General: She is not in acute distress.  Appearance: Normal appearance. She is well-developed. She is not ill-appearing or toxic-appearing.  HENT:     Head: Normocephalic.     Right Ear: Hearing, tympanic membrane, ear canal and external ear normal. Tympanic membrane is not erythematous, retracted or bulging.     Left Ear: Hearing, tympanic membrane, ear canal and external ear normal. Tympanic membrane is not erythematous, retracted or bulging.     Nose: No mucosal edema or rhinorrhea.     Right Sinus: No maxillary sinus tenderness or frontal sinus tenderness.     Left Sinus: No maxillary sinus tenderness or frontal sinus tenderness.     Mouth/Throat:     Pharynx: Uvula midline.  Eyes:     General: Lids are normal. Lids are everted, no foreign bodies appreciated.     Conjunctiva/sclera: Conjunctivae normal.     Pupils:  Pupils are equal, round, and reactive to light.  Neck:     Thyroid: No thyroid mass or thyromegaly.     Vascular: No carotid bruit.     Trachea: Trachea normal.  Cardiovascular:     Rate and Rhythm: Normal rate and regular rhythm.     Pulses: Normal pulses.     Heart sounds: Normal heart sounds, S1 normal and S2 normal. No murmur heard.    No friction rub. No gallop.  Pulmonary:     Effort: Pulmonary effort is normal. No tachypnea or respiratory distress.     Breath sounds: Normal breath sounds. No decreased breath sounds, wheezing, rhonchi or rales.  Abdominal:     General: Bowel sounds are normal.     Palpations: Abdomen is soft.     Tenderness: There is no abdominal tenderness.  Musculoskeletal:     Cervical back: Neck supple. Spasms and tenderness present. No bony tenderness. Pain with movement present. Decreased range of motion.     Thoracic back: Normal.  Skin:    General: Skin is warm and dry.     Findings: No rash.  Neurological:     Mental Status: She is alert.  Psychiatric:        Mood and Affect: Mood is not anxious or depressed.        Speech: Speech normal.        Behavior: Behavior normal. Behavior is cooperative.        Thought Content: Thought content normal.        Judgment: Judgment normal.       Results for orders placed or performed in visit on 01/07/23  Hemoglobin A1c  Result Value Ref Range   Hgb A1c MFr Bld 5.0 4.8 - 5.6 %   Est. average glucose Bld gHb Est-mCnc 97 mg/dL  VITAMIN D 25 Hydroxy (Vit-D Deficiency, Fractures)  Result Value Ref Range   Vit D, 25-Hydroxy 30.9 30.0 - 100.0 ng/mL  Comprehensive metabolic panel  Result Value Ref Range   Glucose 71 70 - 99 mg/dL   BUN 11 6 - 20 mg/dL   Creatinine, Ser 0.85 0.57 - 1.00 mg/dL   eGFR 93 >59 mL/min/1.73   BUN/Creatinine Ratio 13 9 - 23   Sodium 141 134 - 144 mmol/L   Potassium 4.5 3.5 - 5.2 mmol/L   Chloride 100 96 - 106 mmol/L   CO2 25 20 - 29 mmol/L   Calcium 9.3 8.7 - 10.2 mg/dL    Total Protein 7.0 6.0 - 8.5 g/dL   Albumin 4.7 3.9 - 4.9 g/dL   Globulin, Total 2.3 1.5 - 4.5 g/dL   Albumin/Globulin Ratio  2.0 1.2 - 2.2   Bilirubin Total <0.2 0.0 - 1.2 mg/dL   Alkaline Phosphatase 89 44 - 121 IU/L   AST 22 0 - 40 IU/L   ALT 35 (H) 0 - 32 IU/L  Vitamin B12  Result Value Ref Range   Vitamin B-12 1,014 232 - 1,245 pg/mL  Lipid panel  Result Value Ref Range   Cholesterol, Total 260 (H) 100 - 199 mg/dL   Triglycerides 108 0 - 149 mg/dL   HDL 82 >39 mg/dL   VLDL Cholesterol Cal 19 5 - 40 mg/dL   LDL Chol Calc (NIH) 159 (H) 0 - 99 mg/dL   Chol/HDL Ratio 3.2 0.0 - 4.4 ratio  Insulin, random  Result Value Ref Range   INSULIN 15.6 2.6 - 24.9 uIU/mL    Assessment and Plan Neck pain appears to be most likely associated with acute migraine but could also be associated with muscle injury from lifting weights..  Description of headache is classic for migraine with left temporal pain, nausea and light sensitivity.  Vertigo may possibly also be associated with migraine.  Negative Apley test on exam today.  No clear sign of middle ear effusion.  Will start treating neck pain with Flexeril, massage, gentle stretching and heat.  No sign of cervical radiculopathy.  No red flags. She will also start ibuprofen 800 mg p.o. every 8 hours as needed for pain and inflammation.  If this does not improve her migraine as well as her neck pain she can try a migraine specific medication such as Imitrex.  This prescription was reviewed with patient in detail along with side effects and method of administering the medication.  Return and ER precautions provided.  Neck pain on left side  Vertigo  Migraine without aura and without status migrainosus, not intractable  Other orders -     SUMAtriptan Succinate; Take 1 tablet (100 mg total) by mouth every 2 (two) hours as needed for migraine. May repeat in 2 hours if headache persists or recurs.  Dispense: 10 tablet; Refill: 0 -      Cyclobenzaprine HCl; Take 1 tablet (10 mg total) by mouth 3 (three) times daily as needed for muscle spasms.  Dispense: 15 tablet; Refill: 0    No follow-ups on file.   Eliezer Lofts, MD

## 2023-02-21 NOTE — Patient Instructions (Addendum)
Start flexeril at bedtime for muscle relaxation.  Start heat , gentle stretching massage.  Start ibuprofen 800 mg  up to every 8 hours for headache.  Call if not improving as expected can try treatment of migraine.. . Imitrex.  Work on healthy lifestyle and avid migraine triggers.

## 2023-03-04 ENCOUNTER — Ambulatory Visit (INDEPENDENT_AMBULATORY_CARE_PROVIDER_SITE_OTHER): Payer: 59 | Admitting: Family Medicine

## 2023-03-18 ENCOUNTER — Encounter (INDEPENDENT_AMBULATORY_CARE_PROVIDER_SITE_OTHER): Payer: Self-pay | Admitting: Adult Health

## 2023-03-18 ENCOUNTER — Ambulatory Visit (INDEPENDENT_AMBULATORY_CARE_PROVIDER_SITE_OTHER): Payer: 59 | Admitting: Adult Health

## 2023-03-18 VITALS — BP 127/85 | HR 71 | Temp 98.1°F | Ht 65.0 in | Wt 182.0 lb

## 2023-03-18 DIAGNOSIS — E669 Obesity, unspecified: Secondary | ICD-10-CM | POA: Diagnosis not present

## 2023-03-18 DIAGNOSIS — R7303 Prediabetes: Secondary | ICD-10-CM | POA: Diagnosis not present

## 2023-03-18 DIAGNOSIS — Z6830 Body mass index (BMI) 30.0-30.9, adult: Secondary | ICD-10-CM

## 2023-03-18 DIAGNOSIS — E559 Vitamin D deficiency, unspecified: Secondary | ICD-10-CM

## 2023-03-18 DIAGNOSIS — E88819 Insulin resistance, unspecified: Secondary | ICD-10-CM

## 2023-03-18 MED ORDER — D-3-5 125 MCG (5000 UT) PO CAPS: 5000.0000 [IU] | ORAL_CAPSULE | Freq: Every day | ORAL | 0 refills | Status: DC

## 2023-03-18 MED ORDER — METFORMIN HCL 500 MG PO TABS: ORAL_TABLET | ORAL | 0 refills | Status: DC

## 2023-03-18 NOTE — Progress Notes (Addendum)
WEIGHT SUMMARY AND BIOMETRICS  Vitals Temp: 98.1 F (36.7 C) BP: 127/85 Pulse Rate: 71 SpO2: 100 %   Anthropometric Measurements Height: 5\' 5"  (1.651 m) Weight: 182 lb (82.6 kg) BMI (Calculated): 30.29   Body Composition  Body Fat %: 40.8 % Fat Mass (lbs): 74.6 lbs Muscle Mass (lbs): 102.8 lbs Total Body Water (lbs): 71.4 lbs Visceral Fat Rating : 8   Other Clinical Data Fasting: no Labs: no    Chief Complaint:   OBESITY [name] is here to discuss her progress with her obesity treatment plan. She is on the keeping a food journal and adhering to recommended goals of 1200-1300 calories and 90 protein and states she is following her eating plan approximately 50 % of the time. She states she is exercising walking 30 minutes 3 times per week.   Interim History:  She estimates to track intake about 50% and average caloric intake 1150-1250 with average protein 60g.  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  She is currently in 5th grade, IAP is not being recognized and she is suffering scholastically.   Of Note- Ms. Grygiel stopped Wellbutrin SR 150mg  BID approximately 3 weeks ago. She denies withdrawal sx's. She prefers to remain off.  Subjective:   1. Insulin resistance  Latest Reference Range & Units 01/07/23 11:09  Glucose 70 - 99 mg/dL 71  Hemoglobin A1C 4.8 - 5.6 % 5.0  Est. average glucose Bld gHb Est-mCnc mg/dL 97  INSULIN 2.6 - 24.9 uIU/mL 15.6  She is currently on Metformin 500mg  2 tabs at lunch - she denies GI upset. However been off therapy due to 3 weeks due to missed OV.  2. Prediabetes  Latest Reference Range & Units 01/07/23 11:09  Glucose 70 - 99 mg/dL 71  Hemoglobin A1C 4.8 - 5.6 % 5.0  Est. average glucose Bld gHb Est-mCnc mg/dL 97  INSULIN 2.6 - 24.9 uIU/mL 15.6  She is currently on Metformin 500mg  2 tabs at lunch - she denies GI upset. However been off therapy due to 3  weeks due to missed OV.  3. Vitamin D deficiency  Latest Reference Range & Units 01/07/23 11:09  Vitamin D, 25-Hydroxy 30.0 - 100.0 ng/mL 30.9  She is on daily  Vit D 5,000 IU   Assessment/Plan:   1. Insulin resistance Refill Metformin 500mg  2 tabs at lunch Disp 180 RF 0  2. Prediabetes Refill Metformin 500mg  2 tabs at lunch Disp 180 RF 0  3. Vitamin D deficiency Refill Vit D 5,000 IU Disp 90 caps RF 0  4. Obesity with current BMI of 33.4  Remain off Wellbutrin SR 150mg  BID  Ione is currently in the action stage of change. As such, her goal is to continue with weight loss efforts. She has agreed to keeping a food journal and adhering to recommended goals of 1200-1300 calories and 90 protein.   Exercise goals: For substantial health benefits, adults should do at least 150 minutes (2 hours and 30 minutes) a week of moderate-intensity, or 75 minutes (1 hour and 15 minutes) a week of vigorous-intensity aerobic physical activity, or an equivalent combination of moderate- and vigorous-intensity aerobic activity. Aerobic activity should be performed in episodes of at least 10 minutes, and preferably, it should be spread throughout the week.  Behavioral modification strategies: increasing lean protein intake, decreasing simple carbohydrates, increasing vegetables, increasing water intake, no skipping meals, meal planning and cooking strategies, keeping  healthy foods in the home, and planning for success.  [name] has agreed to follow-up with our clinic in 4 weeks. She was informed of the importance of frequent follow-up visits to maximize her success with intensive lifestyle modifications for her multiple health conditions.   Objective:   Blood pressure 127/85, pulse 71, temperature 98.1 F (36.7 C), height 5\' 5"  (1.651 m), weight 182 lb (82.6 kg), SpO2 100 %. Body mass index is 30.29 kg/m.  General: Cooperative, alert, well developed, in no acute distress. HEENT: Conjunctivae and lids  unremarkable. Cardiovascular: Regular rhythm.  Lungs: Normal work of breathing. Neurologic: No focal deficits.   Lab Results  Component Value Date   CREATININE 0.85 01/07/2023   BUN 11 01/07/2023   NA 141 01/07/2023   K 4.5 01/07/2023   CL 100 01/07/2023   CO2 25 01/07/2023   Lab Results  Component Value Date   ALT 35 (H) 01/07/2023   AST 22 01/07/2023   ALKPHOS 89 01/07/2023   BILITOT <0.2 01/07/2023   Lab Results  Component Value Date   HGBA1C 5.0 01/07/2023   HGBA1C 5.1 04/09/2022   HGBA1C 5.0 11/16/2021   HGBA1C 5.3 06/21/2021   HGBA1C 5.0 02/21/2021   Lab Results  Component Value Date   INSULIN 15.6 01/07/2023   INSULIN 7.5 04/09/2022   INSULIN 11.2 11/16/2021   INSULIN 10.6 06/21/2021   INSULIN 14.9 02/21/2021   Lab Results  Component Value Date   TSH 1.420 07/30/2022   Lab Results  Component Value Date   CHOL 260 (H) 01/07/2023   HDL 82 01/07/2023   LDLCALC 159 (H) 01/07/2023   TRIG 108 01/07/2023   CHOLHDL 3.2 01/07/2023   Lab Results  Component Value Date   VD25OH 30.9 01/07/2023   VD25OH 47.4 07/30/2022   VD25OH 36.4 04/09/2022   Lab Results  Component Value Date   WBC 6.2 07/30/2022   HGB 13.5 07/30/2022   HCT 40.1 07/30/2022   MCV 88 07/30/2022   PLT 279 07/30/2022   Lab Results  Component Value Date   IRON 57 02/24/2019   FERRITIN 30.7 02/24/2019   Attestation Statements:   Reviewed by clinician on day of visit: allergies, medications, problem list, medical history, surgical history, family history, social history, and previous encounter notes.  I have reviewed the above documentation for accuracy and completeness, and I agree with the above. -  Tashawnda Bleiler d. Lakela Kuba, NP-C

## 2023-04-08 ENCOUNTER — Encounter: Payer: Self-pay | Admitting: Obstetrics and Gynecology

## 2023-04-18 ENCOUNTER — Ambulatory Visit (INDEPENDENT_AMBULATORY_CARE_PROVIDER_SITE_OTHER): Payer: 59 | Admitting: Adult Health

## 2023-06-17 ENCOUNTER — Ambulatory Visit (INDEPENDENT_AMBULATORY_CARE_PROVIDER_SITE_OTHER): Payer: Self-pay | Admitting: Internal Medicine

## 2023-06-17 ENCOUNTER — Encounter: Payer: Self-pay | Admitting: Internal Medicine

## 2023-06-17 VITALS — BP 120/70 | HR 72 | Ht 65.0 in | Wt 192.2 lb

## 2023-06-17 DIAGNOSIS — N643 Galactorrhea not associated with childbirth: Secondary | ICD-10-CM

## 2023-06-17 DIAGNOSIS — E041 Nontoxic single thyroid nodule: Secondary | ICD-10-CM

## 2023-06-17 DIAGNOSIS — E039 Hypothyroidism, unspecified: Secondary | ICD-10-CM

## 2023-06-17 LAB — T4, FREE: Free T4: 0.84 ng/dL (ref 0.60–1.60)

## 2023-06-17 LAB — TSH: TSH: 1.74 u[IU]/mL (ref 0.35–5.50)

## 2023-06-17 MED ORDER — LEVOTHYROXINE SODIUM 50 MCG PO TABS: 50.0000 ug | ORAL_TABLET | Freq: Every day | ORAL | 3 refills | Status: DC

## 2023-06-17 NOTE — Progress Notes (Signed)
Patient ID: [name], female   DOB: April 20, 1991, 32 y.o.   MRN: [id number]  HPI  [name] is a 32 y.o.-year-old female, initially referred by her PCP, Dr. Selena Batten, returning for follow-up for hypothyroidism, a thyroid nodule and galactorrhea.  She previously saw Dr. Lucianne Muss in our practice, wanting to switch to see a female physician.  Last visit with him was in 09/2020.  Last visit with me 1 year ago.  Interim history: She denies breast discharge at this visit. She gained 10 pounds since last visit. She is off OCPs now. No menses. UPT negative. She does have lower abd. Pressure.  Plans to see OB/GYN again.  Reviewed history: Patient mentions that she had a probable miscarriage in 2017, after which she was diagnosed with ovarian cysts and also with hypothyroidism.  Hypothyroidism (autoimmune per review of Dr. Remus Blake notes) >> currently on Levothyroxine 50 mcg.  She takes the thyroid hormone: - fasting - with water - separated by >30 min from b'fast  - no calcium, iron, PPIs - Multivitamins - 30 min after LT4! >> moved 4h later  I reviewed pt's thyroid tests: Lab Results  Component Value Date   TSH 1.420 07/30/2022   TSH 1.720 04/09/2022   TSH 1.84 12/07/2021   TSH 1.660 11/16/2021   TSH 1.68 10/12/2020   TSH 1.710 05/31/2020   TSH 2.27 02/26/2020   TSH 3.29 10/12/2019   TSH 1.35 02/24/2019   TSH 1.57 04/22/2018   FREET4 0.87 12/07/2021   FREET4 1.27 11/16/2021   FREET4 0.74 10/12/2020   FREET4 1.13 05/31/2020   FREET4 0.82 02/26/2020   FREET4 0.70 10/12/2019   FREET4 0.83 02/24/2019   FREET4 0.75 04/22/2018   FREET4 0.61 04/23/2017   FREET4 0.70 02/12/2017   T3FREE 3.5 12/07/2021   T3FREE 3.4 10/12/2019   T3FREE 3.4 11/21/2016   Antithyroid antibodies were elevated: Component     Latest Ref Rng & Units 12/07/2021  Thyroglobulin Ab     < or = 1 IU/mL 5 (H)  Thyroperoxidase Ab SerPl-aCnc     <9 IU/mL 210 (H)   At last visit, we checked a thyroid ultrasound and  this showed a thyroid nodule: Thyroid U/S (12/21/2021): Parenchymal Echotexture: Mildly heterogenous Isthmus: 0.4 cm Right lobe: 5.2 x 1.4 x 2.0 cm Left lobe: 5.6 x 1.0 x 1.4 cm _________________________________________________________  Nodule # 1: Location: Isthmus; Inferior Maximum size: 1.5 cm; Other 2 dimensions: 1.2 x 0.9 cm Composition: solid/almost completely solid (2) Echogenicity: isoechoic (1) *Given size (>/= 1.5 - 2.4 cm) and appearance, a follow-up ultrasound in 1 year should be considered based on TI-RADS criteria. _________________________________________________________  Also observed are a few additional scattered subcentimeter isoechoic solid nodules with indiscrete borders, likely representing pseudo nodules. Just posterior to the thyroid capsule in the left upper, right lower, and left lower thyroid are subcentimeter heterogeneously hypodense ovoid structures most compatible with parathyroid glands. No appreciable cervical lymphadenopathy.  IMPRESSION: Solitary isthmus solid thyroid nodule (labeled 1, 1.5 cm) meets criteria (TI-RADS category 3) for 1 year ultrasound surveillance. This finding likely corresponds to the palpable area of concern.      Thyroid U/S (11/27/2022): Parenchymal Echotexture: Normal  Isthmus: 1.0 cm  Right lobe: 4.8 x 1.5 x 1.6 cm  Left lobe: 4.2 x 0.9 x 1.5 cm  _________________________________________________________   Estimated total number of nodules >/= 1 cm: 1 _________________________________________________________   Nodule labeled 1 refers to a solitary solid isoechoic TR 3 nodule in the thyroid isthmus that  measures up to 1.5 cm, previously measuring up to 1.5 cm. It remains essentially unchanged.   IMPRESSION: Stable appearance of solitary 1.5 cm TR 3 nodule in the thyroid isthmus. This nodule meets criteria for follow-up ultrasound in 1 year. This exam demonstrates 1 year stability. A total follow-up interval of  5 years is recommended.   No new thyroid nodules.  Pt denies feeling nodules in neck, hoarseness, dysphagia/odynophagia.  She has + FH of thyroid disorders in: father, PGM. No FH of thyroid cancer.  No h/o radiation tx to head or neck. No recent use of iodine supplements.  She describes that she was found to have ovarian cysts in 2017 after a probable spontaneous miscarriage.  Transvaginal ultrasound (12/28/2016): Both ovaries are enlarged with multiple cysts measuring 2-3 cm, separated by thin septations. Differential diagnosis includes polycystic ovarian syndrome, theca lutein cysts, ovarian hyperstimulation, or less likely ovarian mucinous neoplasms. Consider correlation with serum hormone levels and tumor markers, and followup by ultrasound in 8-12 weeks.  She had a follow-up uterine ultrasound (10/16/2017)-Dr. Yalcinkaya's office: Anteverted uterus visualized and appearing within normal limits.  Total AFC was 20 with 3 larger follicles in the right ovary and 2 on the left ovary.  Transvaginal ultrasound (04/29/2019): Unremarkable uterus and LEFT ovary.   Complicated cystic lesion within RIGHT ovary 3.7 x 2.9 x 3.0 cm in size containing septations, 1 of which demonstrates small amount of internal blood flow.   Finding like represents an ovarian neoplasm though this could be benign or malignant.  Laparoscopic exploratory surgery (07/08/2019): Cysts were resected.  No endometriosis.   She has 1 daughter (she was 9) and 1 step son. Had 2 surgical abortions at 29 and 29 y/o.   She was previously started on OCPs for dysmenorrhea, headaches, and severe PMS symptoms-taking them continuously. Now off and would not like to restart them.  Previous testosterone, estrogen, progesterone levels reviewed per records from Dr. April Manson from 10/16/2017 and these were normal.  Patient also noticed mild galactorrhea -after starting Wellbutrin for weight loss. She stopped Wellbutrin  since.  Reviewed prolactin levels: Lab Results  Component Value Date   PROLACTIN 7.1 12/07/2021   No hook effect: Component Ref Range & Units   PROLACTIN,UNDILUTED 2.0 - 30.0 ng/mL 7.1   PROLACTIN,DILUTED ng/mL   Comment: Result confirmed by 1:100 dilution. No high dose  hook effect detected.              Reference Range   Females          Non-pregnant        3.0-30.0          Pregnant           10.0-209.0          Postmenopausal      2.0-20.0  .    MRI of brain and pituitary gland (01/03/2017): No brain or pituitary abnormality.  Patient was not on Risperdal, Reglan, does not smoke marijuana.  No excessive exercise-does strength exercises 4 times a week.  Mammogram + [name] (03/07/2022): BI-RADS CATEGORY  4: Suspicious. 1. 2.5 cm group of indeterminate UPPER-OUTER RIGHT breast calcifications. Tissue sampling is recommended. 2. Benign RIGHT nipple discharge per history and appearance. 3. No other suspicious mammographic findings noted.  Bx (03/16/2022): PATHOLOGY revealed: Breast, right, needle core biopsy, UOQ- BENIGN FIBROGLANDULAR BREAST TISSUE WITH MILD FIBROCYSTIC CHANGE.- NEGATIVE FOR MALIGNANCY.- MICROCALCIFICATIONS PRESENT.  She has insulin resistance - on metformin per Dr. Dalbert Garnet (Weight management clinic). She had a high  vitamin D level so her vitamin D supplement  was held. She had COVID-19 in 06/2021.  She recovered well. She does have a history of microscopic colitis seen on colonoscopy.  ROS: + see HPI  Past Medical History:  Diagnosis Date   Fatty liver    Headache    Migraines   History of chicken pox    Hypothyroidism    IBS (irritable bowel syndrome)    Microscopic colitis    PCOS (polycystic ovarian syndrome)    Urinary tract infection    Vertigo    Past Surgical History:  Procedure Laterality Date   LAPAROSCOPIC OVARIAN CYSTECTOMY Bilateral 07/08/2019   Procedure: LAPAROSCOPIC OVARIAN CYSTECTOMY with peritoneal biopsies;  Surgeon: Allie Bossier,  MD;  Location: [facility];  Service: Gynecology;  Laterality: Bilateral;   TONSILLECTOMY     adenoidectomy   WISDOM TOOTH EXTRACTION     Social History   Socioeconomic History   Marital status: Married    Spouse name: Kimyada Flinders   Number of children: 1   Years of education: Not on file   Highest education level: Not on file  Occupational History   Occupation: Starting non profit  Tobacco Use   Smoking status: Never   Smokeless tobacco: Never  Vaping Use   Vaping Use: Never used  Substance and Sexual Activity   Alcohol use: Yes    Alcohol/week: 0.0 standard drinks of alcohol    Comment: a few times a year   Drug use: No   Sexual activity: Yes    Birth control/protection: Pill  Other Topics Concern   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 - [employer]      Family: daughter - Rodman Pickle (2014)      Enjoys: exercise, spend time with family      Exercise: strength training - mix of everything - 5 days a week, 1 hour   Diet: low calorie, healthy diet and high protein diet      Safety   Seat belts: Yes    Guns: No   Safe in relationships: Yes    Social Determinants of Corporate investment banker Strain: Not on file  Food Insecurity: Not on file  Transportation Needs: Not on file  Physical Activity: Not on file  Stress: Not on file  Social Connections: Not on file  Intimate Partner Violence: Not on file   Current Outpatient Medications on File Prior to Visit  Medication Sig Dispense Refill   buPROPion (WELLBUTRIN SR) 150 MG 12 hr tablet Take 1 tablet (150 mg total) by mouth 2 (two) times daily. 180 tablet 0   Cholecalciferol (D-3-5) 125 MCG (5000 UT) capsule Take 1 capsule (5,000 Units total) by mouth daily. 90 capsule 0   cyclobenzaprine (FLEXERIL) 10 MG tablet Take 1 tablet (10 mg total) by mouth 3 (three) times daily as needed for  muscle spasms. 15 tablet 0   levothyroxine (SYNTHROID) 50 MCG tablet Take 1 tablet (50 mcg total) by mouth daily. 90 tablet 0   metFORMIN (GLUCOPHAGE) 500 MG tablet 2 tabs with lunch 180 tablet 0   SUMAtriptan (IMITREX) 100 MG tablet Take 1 tablet (100 mg total) by mouth every 2 (two) hours as needed for migraine. May repeat in 2 hours if headache persists or recurs. 10 tablet 0   No current facility-administered medications  on file prior to visit.   Allergies  Allergen Reactions   Erythromycin    Penicillins Hives    Can tolerate cephalosporins   Family History  Problem Relation Age of Onset   Sleep apnea Mother    Obesity Mother    Thyroid disease Father    Healthy Brother    Diabetes Maternal Grandmother    Breast cancer Maternal Grandmother 75   Parkinson's disease Maternal Grandmother    Tremor Maternal Grandmother    Kidney disease Paternal Grandmother    Thyroid disease Paternal Grandmother    Healthy Daughter    Anesthesia problems Neg Hx    PE: BP 120/70   Pulse 72   Ht 5\' 5"  (1.651 m)   Wt 192 lb 3.2 oz (87.2 kg)   SpO2 99%   BMI 31.98 kg/m  Wt Readings from Last 3 Encounters:  06/17/23 192 lb 3.2 oz (87.2 kg)  03/18/23 182 lb (82.6 kg)  02/21/23 184 lb 8 oz (83.7 kg)   Constitutional: overweight, in NAD Eyes: EOMI, no exophthalmos ENT: no thyromegaly, no cervical lymphadenopathy Cardiovascular: RRR, No MRG Respiratory: CTA B Musculoskeletal: no deformities Skin:  no rashes Neurological: no tremor with outstretched hands  ASSESSMENT: 1.  Hashimoto's hypothyroidism  2.  Isthmic thyroid nodule  3. Galactorrhea  4. Ovarian cysts  PLAN:  1. Patient with longstanding hypothyroidism, due to Hashimoto's thyroiditis on low-dose levothyroxine therapy - latest thyroid labs reviewed with pt. >> normal: Lab Results  Component Value Date   TSH 1.420 07/30/2022  - she continues on LT4 50 mcg daily - pt feels good on this dose. - we discussed about taking  the thyroid hormone every day, with water, >30 minutes before breakfast, separated by >4 hours from acid reflux medications, calcium, iron, multivitamins. Pt. is taking it correctly.  At last visit I advised her to take multivitamins 4 hours after levothyroxine as she was taking them only 30 minutes afterwards. - will check thyroid tests today: TSH and fT4 - If labs are abnormal, she will need to return for repeat TFTs in 1.5 months - I will see her back in a year  2.  Isthmic thyroid nodule -Diagnosed on ultrasound -Denies neck compression symptoms -Overall low risk, measuring 1.5 x 1.2 x 0.9 cm, and isoechoic -a follow-up ultrasound was recommended in a year - we obtained this in 10/2022 and this showed a stable nodule -Plan to repeat the ultrasound in a year from the previous  3. Galactorrhea -Reviewed the results of her pituitary MRI from 2018 and this did not show any tumor -For mild, stimulated, bilateral, galactorrhea, started after starting Wellbutrin for help with weight loss.  She was also on OCPs.  Both of these can cause a mild increase in prolactin.  She came off Wellbutrin after which the discharge localized in her right breast.  Months before last visit she also stopped OCPs. -We checked a prolactin level neat and diluted and these were normal -She had a mammogram that showed a suspicious mass in the right breast.  However, this was biopsied with benign results -At last visit the breast discharge became scant and only with significant stimulation.  This was not bothersome for her -No further follow-up is needed  4. Ovarian cysts -Luteal cyst per review of pathology report from her laparoscopic site -No hirsutism or acne -She had dysmenorrhea and PMS if not on OCPs.  She is currently off OCPs and tells me she would not like to restart them. -  testosterone normal per review of Dr. Lyndal Rainbow labs from 2018. At that time, LH and [facility] were also normal, as were her TFTs -Suspicion  for PCOS is not high -No desire for pregnancy for now and the recent pregnancy test was negative -She does have lower abdominal pressure for which she plans to see OB/GYN again  Needs refills.   Component     Latest Ref Rng 06/17/2023  T4,Free(Direct)     0.60 - 1.60 ng/dL 1.61   TSH     0.96 - 0.45 uIU/mL 1.74   Thyroid tests are normal.  Carlus Pavlov, MD PhD [facility]

## 2023-06-17 NOTE — Patient Instructions (Addendum)
Please continue Levothyroxine 50 mcg daily.  Take the thyroid hormone every day, with water, at least 30 minutes before breakfast, separated by at least 4 hours from: - acid reflux medications - calcium - iron - multivitamins  Please stop at the lab.  Please send me a message in 12/2023 to order another thyroid U/S.  Please return in 1 year.

## 2024-06-16 ENCOUNTER — Ambulatory Visit (INDEPENDENT_AMBULATORY_CARE_PROVIDER_SITE_OTHER): Payer: Self-pay | Admitting: Internal Medicine

## 2024-06-16 ENCOUNTER — Encounter: Payer: Self-pay | Admitting: Internal Medicine

## 2024-06-16 VITALS — BP 116/72 | HR 68 | Ht 65.0 in | Wt 205.0 lb

## 2024-06-16 DIAGNOSIS — N643 Galactorrhea not associated with childbirth: Secondary | ICD-10-CM

## 2024-06-16 DIAGNOSIS — E039 Hypothyroidism, unspecified: Secondary | ICD-10-CM

## 2024-06-16 DIAGNOSIS — E041 Nontoxic single thyroid nodule: Secondary | ICD-10-CM | POA: Diagnosis not present

## 2024-06-16 NOTE — Progress Notes (Addendum)
 Patient ID: [name], female   DOB: 1991/04/18, 33 y.o.   MRN: [id number]  HPI  [name] is a 33 y.o.-year-old female, initially referred by her PCP, Dr. Aletha Leach, returning for follow-up for hypothyroidism, a thyroid  nodule and galactorrhea.  She previously saw Dr. Hubert Leach in our practice, wanting to switch to see a female physician.  Last visit with him was in 09/2020.  Last visit with me 1 year ago.  Interim history: No breast pain or discharge. She gained 10 pounds before last visit and 13 lbs since then. She feels bloated.  At last visit she was off OCPs - still off for 3 years.  She had no menses and UPT was negative.  She had some low abdominal pressure.  She was planning to see OB/GYN again. Now working with a naturopath. She was started on several supplements - now on copper  supplements. She started to see some spotting (prev. No periods). She is frustrated about weight gain - working with a Systems analyst.  She feels that some of her weight gain could be related to muscle gain. She saw [facility].  Reviewed history: Patient mentions that she had a probable miscarriage in 2017, after which she was diagnosed with ovarian cysts and also with hypothyroidism.  Hypothyroidism (autoimmune per review of Dr. Vermell Leach notes) >> currently on Levothyroxine  50 mcg.  She takes the thyroid  hormone: - fasting - with water - separated by >30 min from b'fast  - no calcium, iron, PPIs - Multivitamins - 30 min after LT4! >> moved 4h later - now also Cu  I reviewed pt's thyroid  tests: Lab Results  Component Value Date   TSH 1.74 06/17/2023   TSH 1.420 07/30/2022   TSH 1.720 04/09/2022   TSH 1.84 12/07/2021   TSH 1.660 11/16/2021   TSH 1.68 10/12/2020   TSH 1.710 05/31/2020   TSH 2.27 02/26/2020   TSH 3.29 10/12/2019   TSH 1.35 02/24/2019   FREET4 0.84 06/17/2023   FREET4 0.87 12/07/2021   FREET4 1.27 11/16/2021   FREET4 0.74 10/12/2020   FREET4 1.13 05/31/2020    FREET4 0.82 02/26/2020   FREET4 0.70 10/12/2019   FREET4 0.83 02/24/2019   FREET4 0.75 04/22/2018   FREET4 0.61 04/23/2017   T3FREE 3.5 12/07/2021   T3FREE 3.4 10/12/2019   T3FREE 3.4 11/21/2016   Antithyroid antibodies were elevated: Component     Latest Ref Rng & Units 12/07/2021  Thyroglobulin Ab     < or = 1 IU/mL 5 (H)  Thyroperoxidase Ab SerPl-aCnc     <9 IU/mL 210 (H)   At last visit, we checked a thyroid  ultrasound and this showed a thyroid  nodule: Thyroid  U/S (12/21/2021): Parenchymal Echotexture: Mildly heterogenous Isthmus: 0.4 cm Right lobe: 5.2 x 1.4 x 2.0 cm Left lobe: 5.6 x 1.0 x 1.4 cm _________________________________________________________  Nodule # 1: Location: Isthmus; Inferior Maximum size: 1.5 cm; Other 2 dimensions: 1.2 x 0.9 cm Composition: solid/almost completely solid (2) Echogenicity: isoechoic (1) *Given size (>/= 1.5 - 2.4 cm) and appearance, a follow-up ultrasound in 1 year should be considered based on TI-RADS criteria. _________________________________________________________  Also observed are a few additional scattered subcentimeter isoechoic solid nodules with indiscrete borders, likely representing pseudo nodules. Just posterior to the thyroid  capsule in the left upper, right lower, and left lower thyroid  are subcentimeter heterogeneously hypodense ovoid structures most compatible with parathyroid glands. No appreciable cervical lymphadenopathy.  IMPRESSION: Solitary isthmus solid thyroid  nodule (labeled 1, 1.5 cm) meets  criteria (TI-RADS category 3) for 1 year ultrasound surveillance. This finding likely corresponds to the palpable area of concern.      Thyroid  U/S (11/27/2022): Parenchymal Echotexture: Normal  Isthmus: 1.0 cm  Right lobe: 4.8 x 1.5 x 1.6 cm  Left lobe: 4.2 x 0.9 x 1.5 cm  _________________________________________________________   Estimated total number of nodules >/= 1 cm:  1 _________________________________________________________   Nodule labeled 1 refers to a solitary solid isoechoic TR 3 nodule in the thyroid  isthmus that measures up to 1.5 cm, previously measuring up to 1.5 cm. It remains essentially unchanged.   IMPRESSION: Stable appearance of solitary 1.5 cm TR 3 nodule in the thyroid  isthmus. This nodule meets criteria for follow-up ultrasound in 1 year. This exam demonstrates 1 year stability. A total follow-up interval of 5 years is recommended.   No new thyroid  nodules.  Pt denies feeling nodules in neck, hoarseness, dysphagia/odynophagia.  She has + FH of thyroid  disorders in: father, PGM. No FH of thyroid  cancer.  No h/o radiation tx to head or neck. No recent use of iodine supplements.  She describes that she was found to have ovarian cysts in 2017 after a probable spontaneous miscarriage.  Transvaginal ultrasound (12/28/2016): Both ovaries are enlarged with multiple cysts measuring 2-3 cm, separated by thin septations. Differential diagnosis includes polycystic ovarian syndrome, theca lutein cysts, ovarian hyperstimulation, or less likely ovarian mucinous neoplasms. Consider correlation with serum hormone levels and tumor markers, and followup by ultrasound in 8-12 weeks.  She had a follow-up uterine ultrasound (10/16/2017)-[name]'s office: Anteverted uterus visualized and appearing within normal limits.  Total AFC was 20 with 3 larger follicles in the right ovary and 2 on the left ovary.  Transvaginal ultrasound (04/29/2019): Unremarkable uterus and LEFT ovary.   Complicated cystic lesion within RIGHT ovary 3.7 x 2.9 x 3.0 cm in size containing septations, 1 of which demonstrates small amount of internal blood flow.   Finding like represents an ovarian neoplasm though this could be benign or malignant.  Laparoscopic exploratory surgery (07/08/2019): Cysts were resected.  No endometriosis.   She has 1 daughter (she  was 27) and 1 step son. Had 2 surgical abortions at 37 and 51 y/o.   She was previously started on OCPs for dysmenorrhea, headaches, and severe PMS symptoms-taking them continuously. Now off and would not like to restart them.  Previous testosterone , estrogen, progesterone  levels reviewed per records from [name] from 10/16/2017 and these were normal.  Patient also noticed mild galactorrhea -after starting Wellbutrin  for weight loss. She stopped Wellbutrin  since.  Reviewed prolactin levels: Lab Results  Component Value Date   PROLACTIN 7.1 12/07/2021   No hook effect: Component Ref Range & Units   PROLACTIN,UNDILUTED 2.0 - 30.0 ng/mL 7.1   PROLACTIN,DILUTED ng/mL   Comment: Result confirmed by 1:100 dilution. No high dose  hook effect detected.              Reference Range   Females          Non-pregnant        3.0-30.0          Pregnant           10.0-209.0          Postmenopausal      2.0-20.0  .    MRI of brain and pituitary gland (01/03/2017): No brain or pituitary abnormality.  Patient was not on Risperdal, Reglan , does not smoke marijuana.  No excessive exercise-does strength exercises 4  times a week.  Mammogram + US  (03/07/2022): BI-RADS CATEGORY  4: Suspicious. 1. 2.5 cm group of indeterminate UPPER-OUTER RIGHT breast calcifications. Tissue sampling is recommended. 2. Benign RIGHT nipple discharge per history and appearance. 3. No other suspicious mammographic findings noted.  Bx (03/16/2022): PATHOLOGY revealed: Breast, right, needle core biopsy, UOQ- BENIGN FIBROGLANDULAR BREAST TISSUE WITH MILD FIBROCYSTIC CHANGE.- NEGATIVE FOR MALIGNANCY.- MICROCALCIFICATIONS PRESENT.  She has insulin  resistance - on metformin  per Dr. Alvia Awkward (Weight management clinic). She had a high vitamin D  level so her vitamin D  supplement  was held. She had COVID-19 in 06/2021.  She recovered well. She does have a history of microscopic colitis seen on colonoscopy.  ROS: + see  HPI  Past Medical History:  Diagnosis Date   Fatty liver    Headache    Migraines   History of chicken pox    Hypothyroidism    IBS (irritable bowel syndrome)    Microscopic colitis    PCOS (polycystic ovarian syndrome)    Urinary tract infection    Vertigo    Past Surgical History:  Procedure Laterality Date   LAPAROSCOPIC OVARIAN CYSTECTOMY Bilateral 07/08/2019   Procedure: LAPAROSCOPIC OVARIAN CYSTECTOMY with peritoneal biopsies;  Surgeon: Ana Balling, MD;  Location: [facility];  Service: Gynecology;  Laterality: Bilateral;   TONSILLECTOMY     adenoidectomy   WISDOM TOOTH EXTRACTION     Social History   Socioeconomic History   Marital status: Married    Spouse name: Stachia Slutsky   Number of children: 1   Years of education: Not on file   Highest education level: Not on file  Occupational History   Occupation: Starting non profit  Tobacco Use   Smoking status: Never   Smokeless tobacco: Never  Vaping Use   Vaping status: Never Used  Substance and Sexual Activity   Alcohol use: Yes    Alcohol/week: 0.0 standard drinks of alcohol    Comment: a few times a year   Drug use: No   Sexual activity: Yes    Birth control/protection: Pill  Other Topics Concern   Not on file  Social History Narrative   03/01/21   From: the area   Living: with husband, Ernestina Headland and daughter   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 - [employer]      Family: daughter - Dorisann Garre (2014)      Enjoys: exercise, spend time with family      Exercise: strength training - mix of everything - 5 days a week, 1 hour   Diet: low calorie, healthy diet and high protein diet      Safety   Seat belts: Yes    Guns: No   Safe in relationships: Yes    Social Drivers of Corporate investment banker Strain: Not on file  Food Insecurity: Not on file  Transportation Needs: Not on file  Physical Activity: Not on file  Stress: Not on file  Social Connections: Not on file  Intimate  Partner Violence: Not on file   Current Outpatient Medications on File Prior to Visit  Medication Sig Dispense Refill   buPROPion  (WELLBUTRIN  SR) 150 MG 12 hr tablet Take 1 tablet (150 mg total) by mouth 2 (two) times daily. (Patient not taking: Reported on 06/17/2023) 180 tablet 0   Cholecalciferol (D-3-5) 125 MCG (5000 UT) capsule Take 1 capsule (5,000 Units total) by mouth daily. 90 capsule 0   cyclobenzaprine  (FLEXERIL ) 10 MG tablet Take 1 tablet (10  mg total) by mouth 3 (three) times daily as needed for muscle spasms. (Patient not taking: Reported on 06/17/2023) 15 tablet 0   levothyroxine  (SYNTHROID ) 50 MCG tablet Take 1 tablet (50 mcg total) by mouth daily. 90 tablet 3   metFORMIN  (GLUCOPHAGE ) 500 MG tablet 2 tabs with lunch (Patient taking differently: 1 tab with lunch) 180 tablet 0   SUMAtriptan  (IMITREX ) 100 MG tablet Take 1 tablet (100 mg total) by mouth every 2 (two) hours as needed for migraine. May repeat in 2 hours if headache persists or recurs. (Patient not taking: Reported on 06/17/2023) 10 tablet 0   No current facility-administered medications on file prior to visit.   Allergies  Allergen Reactions   Erythromycin    Penicillins Hives    Can tolerate cephalosporins   Family History  Problem Relation Age of Onset   Sleep apnea Mother    Obesity Mother    Thyroid  disease Father    Healthy Brother    Diabetes Maternal Grandmother    Breast cancer Maternal Grandmother 66   Parkinson's disease Maternal Grandmother    Tremor Maternal Grandmother    Kidney disease Paternal Grandmother    Thyroid  disease Paternal Grandmother    Healthy Daughter    Anesthesia problems Neg Hx    PE: BP 116/72   Pulse 68   Ht 5' 5 (1.651 m)   Wt 205 lb (93 kg)   SpO2 99%   BMI 34.11 kg/m  Wt Readings from Last 3 Encounters:  06/16/24 205 lb (93 kg)  06/17/23 192 lb 3.2 oz (87.2 kg)  03/18/23 182 lb (82.6 kg)   Constitutional: overweight, in NAD Eyes: EOMI, no exophthalmos ENT:  no thyromegaly, approximately 1 cm frontal cervical thyroid  nodule palpated, no cervical lymphadenopathy Cardiovascular: RRR, No MRG Respiratory: CTA B Musculoskeletal: no deformities Skin:  no rashes Neurological: no tremor with outstretched hands  ASSESSMENT: 1.  Hashimoto's hypothyroidism  2.  Isthmic thyroid  nodule  3. Galactorrhea  4. Ovarian cysts  PLAN:  1. Patient with longstanding hypothyroidism, due to Hashimoto's thyroiditis on low-dose levothyroxine  therapy - latest thyroid  labs reviewed with pt. >> normal: Lab Results  Component Value Date   TSH 1.74 06/17/2023  - she continues on LT4 50 mcg daily - pt feels good on this dose, but she does feel frustrated about weight gain of 13 pounds since last visit and inability to lose weight.  She is working with a Systems analyst.  She tried [facility], but this did not help much.  She feels that her diet is adequate. - we discussed about taking the thyroid  hormone every day, with water, >30 minutes before breakfast, separated by >4 hours from acid reflux medications, calcium, iron, multivitamins. Pt. is taking it correctly. - will check thyroid  tests today: TSH and fT4 - If labs are abnormal, she will need to return for repeat TFTs in 1.5 months - OTW, I will see her back in a year  2.  Isthmic thyroid  nodule - Noted on ultrasound - No compression symptoms and she feels that her thyroid  nodule is smaller in size.  This is confirmed on today's neck exam. - Overall low risk, measuring 1.5 x 1.2 x 0.9 cm and isoechoic.  In 2023, we repeated the ultrasound and this showed a stable nodule - Will need to repeat a thyroid  ultrasound now  3. Galactorrhea -Reviewed the results of her pituitary MRI from 2018 and this did not show any tumor -For mild,  stimulated, bilateral, galactorrhea, started after starting Wellbutrin  for help with weight loss.  She was also on OCPs.  Both of these can cause a mild  increase in prolactin.  She came off Wellbutrin  after which the discharge localized in her right breast.  Months before last visit she also stopped OCPs. -We checked a prolactin level neat and diluted and these were normal -She had a mammogram that showed a suspicious mass in the right breast.  However, this was biopsied with benign results -The breast discharge became scant and only with significant stimulation, not bothersome for her - Will need to follow her expectantly  4. Ovarian cysts - Luteal cyst per review of pathology report from her laparoscopy - No hirsutism or acne - suspicion for PCOS is not high - She had dysmenorrhea and PMS if not on OCPs.  At last visit she was off OCPs and she was planning to restart them - Testosterone  normal per review of Dr. Lindon Rhine labs from 2018. At that time, LH and [facility] were also normal, as were her TFTs - No desire for pregnancy for now  Needs refills.  Office Visit on 06/16/2024  Component Date Value Ref Range Status   TSH 06/16/2024 8.69 (H)  mIU/L Final   Comment:           Reference Range .           > or = 20 Years  0.40-4.50 .                Pregnancy Ranges           First trimester    0.26-2.66           Second trimester   0.55-2.73           Third trimester    0.43-2.91    Free T4 06/16/2024 1.2  0.8 - 1.8 ng/dL Final  TSH elevated.  She did not mention missing any levothyroxine  doses but I will check again with her.  If she is taking it consistently, we will need to increase the dose to 75 mcg daily and recheck the test in 1.5 months.  Emilie Harden, MD PhD [facility] Endocrinology

## 2024-06-16 NOTE — Patient Instructions (Signed)
Please continue Levothyroxine 50 mcg daily.  Take the thyroid hormone every day, with water, at least 30 minutes before breakfast, separated by at least 4 hours from: - acid reflux medications - calcium - iron - multivitamins  Please stop at the lab.  Please return in 1 year.   

## 2024-06-17 ENCOUNTER — Ambulatory Visit: Payer: Self-pay | Admitting: Internal Medicine

## 2024-06-17 ENCOUNTER — Ambulatory Visit
Admission: RE | Admit: 2024-06-17 | Discharge: 2024-06-17 | Disposition: A | Discharge status: Home / Self Care | Source: Ambulatory Visit | Attending: Internal Medicine | Admitting: Internal Medicine

## 2024-06-17 DIAGNOSIS — E039 Hypothyroidism, unspecified: Secondary | ICD-10-CM

## 2024-06-17 DIAGNOSIS — E041 Nontoxic single thyroid nodule: Secondary | ICD-10-CM

## 2024-06-17 LAB — T4, FREE: Free T4: 1.2 ng/dL (ref 0.8–1.8)

## 2024-06-17 LAB — TSH: TSH: 8.69 m[IU]/L — ABNORMAL HIGH

## 2024-06-17 MED ORDER — LEVOTHYROXINE SODIUM 75 MCG PO TABS: 75.0000 ug | ORAL_TABLET | Freq: Every day | ORAL | 1 refills | Status: DC

## 2024-06-18 ENCOUNTER — Other Ambulatory Visit: Payer: Self-pay | Admitting: Internal Medicine

## 2024-06-18 DIAGNOSIS — E039 Hypothyroidism, unspecified: Secondary | ICD-10-CM

## 2024-06-18 NOTE — Telephone Encounter (Signed)
Orders Placed This Encounter   Procedures   • T4, free   • TSH

## 2024-07-16 ENCOUNTER — Ambulatory Visit: Admitting: Obstetrics & Gynecology

## 2024-08-04 ENCOUNTER — Ambulatory Visit (INDEPENDENT_AMBULATORY_CARE_PROVIDER_SITE_OTHER): Admitting: Obstetrics & Gynecology

## 2024-08-04 ENCOUNTER — Other Ambulatory Visit (HOSPITAL_COMMUNITY)
Admission: RE | Admit: 2024-08-04 | Discharge: 2024-08-04 | Disposition: A | Discharge status: Home / Self Care | Source: Ambulatory Visit | Attending: Obstetrics & Gynecology | Admitting: Obstetrics & Gynecology

## 2024-08-04 VITALS — BP 128/84 | HR 73 | Wt 208.0 lb

## 2024-08-04 DIAGNOSIS — R102 Pelvic and perineal pain: Secondary | ICD-10-CM

## 2024-08-04 DIAGNOSIS — A5901 Trichomonal vulvovaginitis: Secondary | ICD-10-CM | POA: Insufficient documentation

## 2024-08-04 DIAGNOSIS — K589 Irritable bowel syndrome without diarrhea: Secondary | ICD-10-CM | POA: Diagnosis not present

## 2024-08-04 DIAGNOSIS — E282 Polycystic ovarian syndrome: Secondary | ICD-10-CM

## 2024-08-04 LAB — POCT URINALYSIS DIPSTICK
Blood, UA: NEGATIVE
Leukocytes, UA: NEGATIVE
Protein, UA: NEGATIVE

## 2024-08-04 NOTE — Progress Notes (Signed)
 GYNECOLOGY OFFICE VISIT NOTE  History:  [name] is a 33 y.o. G3P1001 here today for evaluation of pelvic pain in the setting of known PCOS.  She underwent laparoscopic bilateral ovarian cystectomy by Dr. Birkenhead in 2020, feels she may have cysts again and wants ultrasound evaluation.  Reports constant lower abdominal pain radiating to her back, not alleviated by anything but she does not take any medications.  Exacerbated by eating, also has a history of irritable bowel syndrome. Worries she also may have vaginal infection or UTI, took home tests for these that were positive.  Continues to have irregular menstrual periods, has not been on OCPs/hormonal therapy since 2022.  Does not want to resume this or take Metformin .  Wants to know what else she can do for her PCOS, also has a hard time losing weight. She denies any abnormal vaginal discharge, bleeding, pelvic pain or other concerns.  Past Medical History:  Diagnosis Date   Fatty liver    Headache    Migraines   History of chicken pox    Hypothyroidism    IBS (irritable bowel syndrome)    Microscopic colitis    PCOS (polycystic ovarian syndrome)    Urinary tract infection    Vertigo     Past Surgical History:  Procedure Laterality Date   LAPAROSCOPIC OVARIAN CYSTECTOMY Bilateral 07/08/2019   Procedure: LAPAROSCOPIC OVARIAN CYSTECTOMY with peritoneal biopsies;  Surgeon: Birkenhead Harland BROCKS, MD;  Location: [facility];  Service: Gynecology;  Laterality: Bilateral;   TONSILLECTOMY     adenoidectomy   WISDOM TOOTH EXTRACTION      The following portions of the patient's history were reviewed and updated as appropriate: allergies, current medications, past family history, past medical history, past social history, past surgical history and problem list.   Health Maintenance:  Normal pap and negative HRHPV on 04/19/2022.    Review of Systems:  Pertinent items noted in HPI and remainder of comprehensive ROS otherwise  negative.  Physical Exam:  BP 128/84   Pulse 73   Wt 208 lb (94.3 kg)   BMI 34.61 kg/m  CONSTITUTIONAL: Well-developed, well-nourished female in no acute distress.  SKIN: No rash noted. Not diaphoretic. No erythema. No pallor. MUSCULOSKELETAL: Normal range of motion. No edema noted. NEUROLOGIC: Alert and oriented to person, place, and time. Normal muscle tone coordination. No cranial nerve deficit noted on observation. PSYCHIATRIC: Normal mood and affect. Normal behavior. Normal judgment and thought content. CARDIOVASCULAR: Normal heart rate noted RESPIRATORY: Effort and breath sounds normal, no problems with respiration noted ABDOMEN: No masses or other overt distention noted on observation. Mild diffuse lower abdominal tenderness on palpation, no rebound or guarding.   PELVIC: Deferred  Results for orders placed or performed in visit on 08/04/24 (from the past 24 hours)  POCT Urinalysis Dipstick     Status: Normal   Collection Time: 08/04/24  3:49 PM  Result Value Ref Range   Color, UA     Clarity, UA     Glucose, UA     Bilirubin, UA     Ketones, UA     Spec Grav, UA     Blood, UA negative    pH, UA     Protein, UA Negative Negative   Urobilinogen, UA     Nitrite, UA     Leukocytes, UA Negative Negative   Appearance     Odor      Assessment and Plan:     1. PCOS (polycystic ovarian  syndrome) Counseled patient about management of PCOS, recommended weight loss as the best option which helps with restoring ovulatory cycles, decreases glucose intolerance with improvement of metabolic risk, improves fertility/pregnancy rates and helps with overall health.  Even modest weight loss (5 to 10 percent reduction in body weight) in women with PCOS may result in these effects.   OCPs are also given to women with PCOS for managing hyperandrogenism and menstrual dysfunction and for providing contraception.  PCOS can be also treated with Metformin  given its association with glucose  intolerance and insulin  resistance.  It can also reduce ovarian androgen production and serum free testosterone  concentrations, and help restore normal menstrual cyclicity.    The new GLP-1 formulations work via similar mechanism, unfortunately PCOS usually is not an insurance covered indication.  Advised to follow up with [facility] (she has seen them in past), and to work on weight loss for now.  Will continue to monitor progress.   2. Pelvic pain (Primary) Unsure etiology for her pain, will follow up results and manage accordingly. Recommended NSAIDs as needed for pain.  - Cervicovaginal ancillary only( [place]) - Urine Culture - US  PELVIC COMPLETE WITH TRANSVAGINAL; Future  Routine preventative health maintenance measures emphasized.   Return for any gynecologic concerns.    I spent 50 minutes dedicated to the care of this patient including pre-visit review of records, face to face time with the patient discussing her conditions and treatments, post visit ordering of medications and appropriate tests or procedures, coordinating care and documenting this visit encounter.    GLORIS HUGGER, MD, FACOG Obstetrician & Gynecologist, [facility] for [employer], [facility]

## 2024-08-05 LAB — URINE CULTURE: Organism ID, Bacteria: NO GROWTH

## 2024-08-06 ENCOUNTER — Ambulatory Visit: Payer: Self-pay | Admitting: Obstetrics & Gynecology

## 2024-08-06 DIAGNOSIS — A5901 Trichomonal vulvovaginitis: Secondary | ICD-10-CM

## 2024-08-06 DIAGNOSIS — B9689 Other specified bacterial agents as the cause of diseases classified elsewhere: Secondary | ICD-10-CM

## 2024-08-06 LAB — CERVICOVAGINAL ANCILLARY ONLY
Bacterial Vaginitis (gardnerella): POSITIVE — AB
Candida Glabrata: NEGATIVE
Candida Vaginitis: NEGATIVE
Chlamydia: NEGATIVE
Comment: NEGATIVE
Comment: NEGATIVE
Comment: NEGATIVE
Comment: NEGATIVE
Comment: NEGATIVE
Comment: NORMAL
Neisseria Gonorrhea: NEGATIVE
Trichomonas: POSITIVE — AB

## 2024-08-07 ENCOUNTER — Encounter: Payer: Self-pay | Admitting: Obstetrics & Gynecology

## 2024-08-07 ENCOUNTER — Ambulatory Visit
Admission: RE | Admit: 2024-08-07 | Discharge: 2024-08-07 | Disposition: A | Discharge status: Home / Self Care | Source: Ambulatory Visit | Attending: Obstetrics & Gynecology | Admitting: Obstetrics & Gynecology

## 2024-08-07 DIAGNOSIS — R102 Pelvic and perineal pain: Secondary | ICD-10-CM | POA: Insufficient documentation

## 2024-08-07 MED ORDER — METRONIDAZOLE 500 MG PO TABS: 500.0000 mg | ORAL_TABLET | Freq: Two times a day (BID) | ORAL | 1 refills | Status: AC

## 2024-08-07 NOTE — Progress Notes (Signed)
 Patient has trichomonal vaginitis.  Recommend testing for other STIs, also needs to let partner(s) know so the partner(s) can get testing and treatment. Patient and sex partner(s) should abstain from unprotected sexual activity for two weeks after everyone receives appropriate treatment.  Metronidazole  was prescribed for patient.  Patient will need to return in about 4 weeks after treatment for repeat test of cure.  Please call to inform patient of results and recommendations, and advise to pick up prescription and take as directed.  Please advise patient to practice safe sex at all times.  Of note, patient also has bacterial vaginitis, this will also be treated with the above Metronidazole .  Caleah Tortorelli, MD

## 2024-08-11 ENCOUNTER — Other Ambulatory Visit: Payer: Self-pay | Admitting: *Deleted

## 2024-08-11 MED ORDER — BORIC ACID CRYS
600.0000 mg | CRYSTALS | Freq: Every day | 3 refills | Status: AC
Start: 2024-08-11 — End: 2024-10-10

## 2024-09-22 ENCOUNTER — Ambulatory Visit (INDEPENDENT_AMBULATORY_CARE_PROVIDER_SITE_OTHER)

## 2024-09-22 VITALS — BP 100/70 | HR 73 | Temp 98.8°F | Ht 65.0 in | Wt 203.0 lb

## 2024-09-22 DIAGNOSIS — R7989 Other specified abnormal findings of blood chemistry: Secondary | ICD-10-CM

## 2024-09-22 DIAGNOSIS — Z6833 Body mass index (BMI) 33.0-33.9, adult: Secondary | ICD-10-CM

## 2024-09-22 DIAGNOSIS — Z136 Encounter for screening for cardiovascular disorders: Secondary | ICD-10-CM

## 2024-09-22 DIAGNOSIS — E66811 Obesity, class 1: Secondary | ICD-10-CM

## 2024-09-22 NOTE — Progress Notes (Signed)
   Subjective:    Patient ID: [name], female    DOB: 1991/08/14, 33 y.o.   MRN: [id number]   [name] is a very pleasant 33 y.o. female who presents today as a new patient.  Past medical, surgical and family history: Reviewed and updated in chart.  Allergies: Reviewed and updated in chart.  Medications: Reviewed and updated in chart.  Social history: Reviewed and updated in chart.  Last PCP and reason for leaving: Didn't need a PCP until recently  Today, wants to discuss weight. Been on Metformin  and Wellbutrin  in the past for weight, does not feel they helped, was following with a clinic with [facility] for weight management but stopped for financial reasons. Diet: since separation from husband, it's been hard to keep a good diet. Eats emotionally, sugar cravings.  Exercise:  1 day a week.   Review of Systems  All other systems reviewed and are negative.       BP 100/70 (BP Location: Left Arm, Patient Position: Sitting, Cuff Size: Large)   Pulse 73   Temp 98.8 F (37.1 C) (Oral)   Ht 5' 5 (1.651 m)   Wt 203 lb (92.1 kg)   SpO2 97%   BMI 33.78 kg/m   Objective:    Physical Exam Vitals and nursing note reviewed.  Constitutional:      Appearance: [name] is obese.  HENT:     Head: Normocephalic and atraumatic.  Eyes:     Extraocular Movements: Extraocular movements intact.     Conjunctiva/sclera: Conjunctivae normal.  Skin:    General: Skin is warm.  Neurological:     Mental Status: [name] is alert.  Psychiatric:        Mood and Affect: Mood normal.        Behavior: Behavior normal.            Assessment & Plan:   1. Class 1 obesity with serious comorbidity and body mass index (BMI) of 33.0 to 33.9 in adult, unspecified obesity type (Primary) 2. Screening for cardiovascular condition Patient is interested in improving diet and lifestyle, counseled about healthy nutrition and exercise, will refer to weight management as below.  See age-appropriate  screening labs ordered below for diabetes and cholesterol, if A1c in diabetic range we will discuss GLP-1 medications with patient.  - Amb Ref to [facility] Weight [facility] - Hemoglobin A1c - Lipid Panel; Future  3. Elevated LFTs Per chart review, patient did have elevated AST 1 year ago, unclear etiology, will repeat CMP to monitor for resolution.  Also ordered age-appropriate hepatitis C screening as below.  - Comprehensive metabolic panel with GFR - Hepatitis C antibody    Return in about 1 week (around 09/29/2024) for CPE and irregular period. Please schedule for lab visit 2 days prior.   Cadyn Fann K Renna Kilmer, MD  09/22/24

## 2024-09-22 NOTE — Patient Instructions (Signed)
 Thank you for visiting [facility] today! Here's what we talked about: - Do 150 mins of exercise weekly - Weight clinic will call you

## 2024-09-23 LAB — COMPREHENSIVE METABOLIC PANEL WITH GFR
ALT: 21 U/L (ref 0–35)
AST: 21 U/L (ref 0–37)
Albumin: 4.4 g/dL (ref 3.5–5.2)
Alkaline Phosphatase: 79 U/L (ref 39–117)
BUN: 14 mg/dL (ref 6–23)
CO2: 33 meq/L — ABNORMAL HIGH (ref 19–32)
Calcium: 9.4 mg/dL (ref 8.4–10.5)
Chloride: 102 meq/L (ref 96–112)
Creatinine, Ser: 0.87 mg/dL (ref 0.40–1.20)
GFR: 86.27 mL/min (ref >60.00)
Glucose, Bld: 77 mg/dL (ref 70–99)
Potassium: 4.1 meq/L (ref 3.5–5.1)
Sodium: 141 meq/L (ref 135–145)
Total Bilirubin: 0.3 mg/dL (ref 0.2–1.2)
Total Protein: 7.3 g/dL (ref 6.0–8.3)

## 2024-09-23 LAB — HEMOGLOBIN A1C: Hgb A1c MFr Bld: 5.7 % (ref 4.6–6.5)

## 2024-09-24 LAB — HEPATITIS C ANTIBODY: Hepatitis C Ab: NONREACTIVE

## 2024-09-25 ENCOUNTER — Ambulatory Visit: Payer: Self-pay

## 2024-10-05 ENCOUNTER — Other Ambulatory Visit (INDEPENDENT_AMBULATORY_CARE_PROVIDER_SITE_OTHER)

## 2024-10-05 DIAGNOSIS — Z136 Encounter for screening for cardiovascular disorders: Secondary | ICD-10-CM | POA: Diagnosis not present

## 2024-10-05 LAB — LIPID PANEL
Cholesterol: 191 mg/dL (ref 0–200)
HDL: 60.5 mg/dL (ref >39.00)
LDL Cholesterol: 116 mg/dL — ABNORMAL HIGH (ref 0–99)
NonHDL: 130.84
Total CHOL/HDL Ratio: 3
Triglycerides: 76 mg/dL (ref 0.0–149.0)
VLDL: 15.2 mg/dL (ref 0.0–40.0)

## 2024-10-09 ENCOUNTER — Ambulatory Visit

## 2024-10-09 ENCOUNTER — Encounter: Payer: Self-pay | Admitting: *Deleted

## 2024-10-09 VITALS — BP 122/78 | HR 69 | Temp 97.8°F | Ht 65.0 in | Wt 204.0 lb

## 2024-10-09 DIAGNOSIS — R7303 Prediabetes: Secondary | ICD-10-CM

## 2024-10-09 DIAGNOSIS — E669 Obesity, unspecified: Secondary | ICD-10-CM | POA: Diagnosis not present

## 2024-10-09 DIAGNOSIS — N898 Other specified noninflammatory disorders of vagina: Secondary | ICD-10-CM | POA: Diagnosis not present

## 2024-10-09 DIAGNOSIS — N926 Irregular menstruation, unspecified: Secondary | ICD-10-CM | POA: Diagnosis not present

## 2024-10-09 DIAGNOSIS — Z0000 Encounter for general adult medical examination without abnormal findings: Secondary | ICD-10-CM

## 2024-10-09 NOTE — Patient Instructions (Addendum)
 Thank you for visiting [facility] today! Here's what we talked about: - Schedule for dentist and eye appts - Call you insurance and ask if Georjean is covered for weightloss -

## 2024-10-09 NOTE — Progress Notes (Signed)
 Assessment & Plan:   1. Physical exam, annual (Primary) 2. Obesity: Starting BMI 33.1 3. Prediabetes Age-appropriate screening, counseling and vaccines discussed with patient. Healthy diet and exercise recommendations given, discussed BMI of 33, weight management referral has been sent, still pending per chart review, will send nutrition referral as well to see if patient can get in sooner with them.  Thyroid  dysfunction playing some role given that recent TSH was abnormal, Synthroid  dose was increased to 75 mcg daily by endocrinology, whom she continues to follow with.  Extensively discussed medication options including Wegovy, patient will call her insurance to see if this is covered.  Alternatively, can consider oral medications at a later time.  Overall, patient is reluctant but not entirely opposed to weight loss medications. Plan to repeat A1c in about 3 months. Declined Td, flu, covid and hep B immunity testing.  - Amb ref to Medical Nutrition Therapy-MNT  4. Menstrual irregularity 5. Vaginal dryness Patient has not had a period since 2016, unclear whether patient truly has PCOS, not agreeable to any birth control at this time, no signs of clinical hirsutism on exam, will order testosterone  below to check for hyperandrogenism.  Will also check FSH given vaginal dryness.  Next steps TBD based on results.  Counseled patient further about importance of weight loss in helping metabolic regulation and improving menstrual regularity.  - Testosterone , Free, Total, SHBG; Future - Follicle stimulating hormone; Future   Follow-up: Return in about 3 months (around 01/09/2025) for Prediabetes and mood. Pls schedule in 1 wk for fasting labs.       Subjective:   Patient ID: [name], female    DOB: 1991/08/31  Age: 33 y.o. MRN: [id number]  Patient Care Team: Bennett Reuben POUR, MD as PCP - General (Family Medicine) Gaston Hamilton, MD as Consulting Physician (Urology)   CC:  Chief  Complaint  Patient presents with   Annual Exam    Has an OB/GYN. Next PAP is due 2028. Has not had a regular period since 2016. Spotted a few times in between.      [name] is a 33 y.o. female who presents today for a complete physical exam.   Diet: Drinking more water,  Exercise: Walking a few times a week, once  Mood: Okay Sleep: Good Dental: Need Vision: Need  Last normal period in 2016, got bitten by a tick around that time,  4-5 of light spotting since then lasting about 2d.  Says she did gain a lot of weight after the birth of her daughter in 2013.  Depression Screening;    10/09/2024   11:21 AM 09/22/2024    3:17 PM 09/22/2024    3:09 PM 02/21/2023    9:54 AM 03/01/2021    9:47 AM 05/31/2020    8:51 AM 12/16/2019    8:28 AM  PHQ 2/9 Scores  PHQ - 2 Score 0 0 0  0 2 0  PHQ- 9 Score      10   Exception Documentation    Patient refusal        Anxiety Screening:     No data to display           ROS: Negative unless specifically indicated above in HPI.       Objective:     BP 122/78 (BP Location: Left Arm, Patient Position: Sitting, Cuff Size: Large)   Pulse 69   Temp 97.8 F (36.6 C) (Oral)   Ht 5' 5 (1.651 m)  Wt 204 lb (92.5 kg)   SpO2 99%   BMI 33.95 kg/m    Physical Exam Vitals and nursing note reviewed.  Constitutional:      Appearance: Normal appearance.  HENT:     Head: Normocephalic and atraumatic.     Right Ear: Tympanic membrane, ear canal and external ear normal.     Left Ear: Tympanic membrane, ear canal and external ear normal.     Nose: No congestion or rhinorrhea.     Mouth/Throat:     Mouth: Mucous membranes are moist.     Pharynx: No oropharyngeal exudate or posterior oropharyngeal erythema.  Eyes:     Extraocular Movements: Extraocular movements intact.     Conjunctiva/sclera: Conjunctivae normal.     Pupils: Pupils are equal, round, and reactive to light.  Cardiovascular:     Rate and Rhythm: Normal rate and regular  rhythm.     Heart sounds: No murmur heard. Pulmonary:     Effort: Pulmonary effort is normal.     Breath sounds: Normal breath sounds.  Abdominal:     General: Abdomen is flat. Bowel sounds are normal.     Palpations: Abdomen is soft.  Musculoskeletal:     Right lower leg: No edema.     Left lower leg: No edema.  Skin:    General: Skin is warm.  Neurological:     Mental Status: She is alert. Mental status is at baseline.  Psychiatric:        Mood and Affect: Mood normal.        Behavior: Behavior normal.         Reuben MARLA Burkes, MD  10/09/24

## 2024-10-13 ENCOUNTER — Other Ambulatory Visit

## 2024-10-19 ENCOUNTER — Other Ambulatory Visit (INDEPENDENT_AMBULATORY_CARE_PROVIDER_SITE_OTHER)

## 2024-10-19 DIAGNOSIS — N926 Irregular menstruation, unspecified: Secondary | ICD-10-CM | POA: Diagnosis not present

## 2024-10-19 DIAGNOSIS — N898 Other specified noninflammatory disorders of vagina: Secondary | ICD-10-CM | POA: Diagnosis not present

## 2024-10-19 LAB — FOLLICLE STIMULATING HORMONE: FSH: 42.4 m[IU]/mL

## 2024-10-23 LAB — TESTOSTERONE, FREE, TOTAL, SHBG
Sex Hormone Binding: 50.9 nmol/L (ref 24.6–122.0)
Testosterone, Free: 1.5 pg/mL (ref 0.0–4.2)
Testosterone: 50.9 nmol/L (ref 8–60)

## 2024-10-26 ENCOUNTER — Ambulatory Visit: Payer: Self-pay

## 2024-10-26 DIAGNOSIS — N926 Irregular menstruation, unspecified: Secondary | ICD-10-CM

## 2024-10-28 ENCOUNTER — Other Ambulatory Visit

## 2024-10-28 DIAGNOSIS — N926 Irregular menstruation, unspecified: Secondary | ICD-10-CM | POA: Diagnosis not present

## 2024-11-01 ENCOUNTER — Ambulatory Visit: Payer: Self-pay

## 2024-11-04 LAB — ANDROSTENEDIONE: Androstenedione: 53 ng/dL

## 2024-11-04 LAB — DHEA-SULFATE: DHEA-SO4: 69 ug/dL (ref 19–237)

## 2024-11-12 ENCOUNTER — Ambulatory Visit

## 2024-11-12 VITALS — BP 130/74 | HR 74 | Temp 98.3°F | Ht 65.0 in | Wt 206.0 lb

## 2024-11-12 DIAGNOSIS — E669 Obesity, unspecified: Secondary | ICD-10-CM

## 2024-11-12 DIAGNOSIS — N911 Secondary amenorrhea: Secondary | ICD-10-CM | POA: Diagnosis not present

## 2024-11-12 MED ORDER — PHENTERMINE HCL 15 MG PO CAPS: 15.0000 mg | ORAL_CAPSULE | ORAL | 0 refills | Status: DC

## 2024-11-12 NOTE — Patient Instructions (Addendum)
 Thank you for visiting [facility] today! Here's what we talked about: - START Phentermine - Check BP daily for 1 week, send readings

## 2024-11-12 NOTE — Progress Notes (Signed)
 Subjective:   This visit was conducted in person. The patient gave informed consent to the use of Abridge AI technology to record the contents of the encounter as documented below.   Patient ID: [name], female    DOB: January 01, 1991, 33 y.o.   MRN: [id number]   Discussed the use of AI scribe software for clinical note transcription with the patient, who gave verbal consent to proceed.  History of Present Illness [name] is a 33 year old female who presents with evaluation of lab results and menstrual irregularities.  She has a history of irregular periods, previously diagnosed as anovulatory cycles. Although she was previously diagnosed with polycystic ovary syndrome (PCOS), she reports that recent lab results and a pelvic ultrasound did not show findings consistent with this diagnosis. She has not had a period even while on birth control in the past.  She has tried various birth control pills, including Sironix and Sprintec, but experienced significant side effects such as migraines and did not achieve regular periods. She has been off birth control for about a year and a half to two years and prefers not to use hormonal treatments due to past negative experiences.  She has been exploring dietary changes, including reducing gluten and dairy, which she reports has made her feel better. She has also undergone gene testing through a private company, which indicated inflammation as a significant issue for her.  She has a history of prediabetes and is considering weight management options. She has been unable to secure a referral to a weight management program due to insurance issues. She is open to exploring options that are covered by her insurance.  Her FSH levels were noted to be elevated at 42. She is seeking further evaluation to understand if this is a consistent finding.    Review of Systems  All other systems reviewed and are negative.       Allergies  Allergen Reactions    Cyclobenzaprine  Other (See Comments)    Lips swelled   Erythromycin    Penicillins Hives    Can tolerate cephalosporins    Current Outpatient Medications on File Prior to Visit  Medication Sig Dispense Refill   levothyroxine  (SYNTHROID ) 75 MCG tablet Take 1 tablet (75 mcg total) by mouth daily. 90 tablet 1   No current facility-administered medications on file prior to visit.    BP 130/74 (BP Location: Left Arm, Patient Position: Sitting, Cuff Size: Large)   Pulse 74   Temp 98.3 F (36.8 C) (Oral)   Ht 5' 5 (1.651 m)   Wt 206 lb (93.4 kg)   LMP  (LMP Unknown)   SpO2 98%   BMI 34.28 kg/m   Objective:      Physical Exam GENERAL: Alert, cooperative, well developed, no acute distress. Obese HEAD: Normocephalic atraumatic. EYES: Extraocular movements intact BL, pupils round, equal and reactive to light BL, conjunctivae normal BL. EXTREMITIES: No cyanosis or edema. NEUROLOGICAL: Oriented to person, place and time, no gait abnormalities, moves all extremities without gross motor or sensory deficit.         Assessment & Plan:   Assessment & Plan Secondary amenorrhea Irregular cycles without current PCOS lab support, she is not meeting diagnostic criteria. Last prolactin test two years ago and it was negative, will rule out hyperprolactinemia as a potential cause..  Discussed hormonal treatment such as OCPs to regulate cycle, she declines due to poor tolerance in the past and worsening of migraines. Elevated FSH on  recent labs at 42.4, consistent with postmenopausal range.  Will order labs below to rule out primary ovarian insufficiency and go from there.  Will also rule out pregnancy as part of workup.  - Ordered prolactin level. - Ordered estradiol  level, LH and beta-hCG - Discussed non-hormonal options, including weight management.  Obesity Previous weight management referral failed due to lack of insurance coverage. Dietary changes underway. Phentermine chosen for  weight loss with insurance coverage potential. Discussed phentermine side effects.  - Prescribed phentermine 15 mg daily. - Instructed daily blood pressure monitoring for one week. - Scheduled follow-up in four weeks to assess phentermine efficacy, can uptitrate dose at that time if well-tolerated.     Return in about 4 weeks (around 12/10/2024) for Phentermine dose, fasting labs in 1 week.   [name] K [name], MD  11/12/24     Contains text generated by Abridge.

## 2024-12-03 ENCOUNTER — Other Ambulatory Visit (INDEPENDENT_AMBULATORY_CARE_PROVIDER_SITE_OTHER)

## 2024-12-03 DIAGNOSIS — N911 Secondary amenorrhea: Secondary | ICD-10-CM | POA: Diagnosis not present

## 2024-12-03 LAB — LUTEINIZING HORMONE: LH: 67.44 m[IU]/mL

## 2024-12-04 LAB — ESTRADIOL: Estradiol: <30 pg/mL

## 2024-12-04 LAB — PROLACTIN: Prolactin: 4.1 ng/mL

## 2024-12-04 LAB — PREGNANCY, URINE: Preg Test, Ur: NEGATIVE

## 2024-12-05 ENCOUNTER — Ambulatory Visit: Payer: Self-pay

## 2024-12-10 ENCOUNTER — Ambulatory Visit (INDEPENDENT_AMBULATORY_CARE_PROVIDER_SITE_OTHER)

## 2024-12-10 VITALS — BP 116/76 | HR 78 | Temp 97.8°F | Ht 65.0 in | Wt 208.0 lb

## 2024-12-10 DIAGNOSIS — E2839 Other primary ovarian failure: Secondary | ICD-10-CM | POA: Insufficient documentation

## 2024-12-10 MED ORDER — PROGESTERONE MICRONIZED 100 MG PO CAPS: 100.0000 mg | ORAL_CAPSULE | Freq: Every day | ORAL | 3 refills | Status: AC

## 2024-12-10 MED ORDER — ESTRADIOL 0.1 MG/24HR TD PTWK: 0.1000 mg | MEDICATED_PATCH | TRANSDERMAL | 3 refills | Status: AC

## 2024-12-10 NOTE — Patient Instructions (Signed)
 Thank you for visiting [facility] today! Here's what we talked about: - START Estrogen patch and progesterone  pill

## 2024-12-10 NOTE — Progress Notes (Signed)
 Subjective:   This visit was conducted in person. The patient gave informed consent to the use of Abridge AI technology to record the contents of the encounter as documented below.   Patient ID: [name], female    DOB: 08/15/1991, 33 y.o.   MRN: [id number]   Discussed the use of AI scribe software for clinical note transcription with the patient, who gave verbal consent to proceed.  History of Present Illness [name] is a 33 year old female who presents for evaluation of hormonal imbalances and associated symptoms.  She has been amenorrheic since 2018. She was previously given a diagnosis of PCOS, but her recent labs were normal.  She has a history of thyroid  autoimmune disease, which may predispose her to other autoimmune conditions. She experiences episodes of dizziness.  There is a family history of breast cancer in her maternal grandmother, but no first-degree relatives with the condition. She denies any personal history of breast cancer, blood clots, liver issues, or unexplained vaginal bleeding. She is not currently in a committed relationship and does not desire children at this time.  She experiences joint pain, particularly in her knees, associated with exercise. She describes being 'sore to hurting' after workouts, attributing this to aging and possibly her hormonal issues. No persistent joint pain when not exercising.  She has a history of Lyme disease diagnosed in 2016 after a tick bite and was treated with antibiotics. She is concerned about a potential link between her current autoimmune issues and chronic Lyme disease, although she does not exhibit characteristic symptoms such as persistent joint pain or heart issues.  Review of Systems  All other systems reviewed and are negative.       Allergies[1]  Medications Ordered Prior to Encounter[2]  BP 116/76 (BP Location: Right Arm, Patient Position: Standing, Cuff Size: Large)   Pulse 78   Temp 97.8 F (36.6  C) (Oral)   Ht 5' 5 (1.651 m)   Wt 208 lb (94.3 kg)   LMP  (LMP Unknown)   SpO2 98%   BMI 34.61 kg/m   Objective:      Physical Exam GENERAL: Alert, cooperative, well developed, no acute distress. HEAD: Normocephalic atraumatic. EYES: Extraocular movements intact BL, pupils round, equal and reactive to light BL, conjunctivae normal BL. EXTREMITIES: No cyanosis or edema. NEUROLOGICAL: Oriented to person, place and time, no gait abnormalities, moves all extremities without gross motor or sensory deficit.    Orthostatic VS for the past 72 hrs (Last 3 readings):  Orthostatic BP Patient Position BP Location Cuff Size Orthostatic Pulse  12/10/24 1619 116/76 Standing Right Arm Large 78  12/10/24 1617 118/78 Sitting Right Arm Large 82  12/10/24 1615 122/80 Supine Right Arm Large 78         Assessment & Plan:   Assessment & Plan Premature ovarian insufficiency Patient has been amenorrheic since 2018, menses did not return after having her daughter, per chart review, has been seen by both endocrinology and GYN, symptoms were initially attributed to PCOS, however, based on lab workup, she is not meeting diagnostic criteria. Labs revealed postmenopausal levels of estradiol  and high luteinizing hormone levels and FSH, indicating premature ovarian failure. Prolactin WNL. Likely autoimmune etiology given existing autoimmune thyroiditis.  Counseled about disease course and expectations.  Risks include bone health deterioration and cardiovascular risks. Estrogen therapy indicated, she has no contraindications i.e. no history of blood clots, breast cancer/uterine cancer or family history of such in first-degree relative, TIA or  stroke. Discussed risks of estrogen therapy, she is agreeable to start. Prefers patch due to ease of application and possibly lower VT risk.  Will also initiate progesterone .  Given lack of personal or family history of breast cancer, would continue HT until age 33. No  desire for fertility at this time, she will notify me if this changes as she will need higher doses of hormone for effective contraception. Will consider whether to order DEXA at this time given estrogen deficiency vs defer. Will further rule out autoimmune adrenal sufficiency given coexisting POI and history of dizziness episodes.  - Ordered repeat FSH and estrogen levels in one month to confirm diagnosis. - Prescribed Climara  patch (0.1 mg) once weekly, applied to arm, buttock, or lower abdomen, not breasts. - Prescribed daily progesterone  (100 mg) to prevent endometrial hyperplasia. - Ordered adrenal antibody labs to assess for autoimmune adrenal insufficiency - Will update and discuss with her endocrinologist Dr Trixie regarding long-term hormone therapy management and monitoring, will appreciate her input. - Monitor for symptoms of blood clots and advise ultrasound if symptoms occur.   Return in about 4 weeks (around 01/07/2025) for Primary Ovarian Insufficiency.   [name], MD  12/10/2024     Contains text generated by Abridge.        [1]  Allergies Allergen Reactions   Cyclobenzaprine  Other (See Comments)    Lips swelled   Erythromycin    Penicillins Hives    Can tolerate cephalosporins  [2]  Current Outpatient Medications on File Prior to Visit  Medication Sig Dispense Refill   levothyroxine  (SYNTHROID ) 75 MCG tablet Take 1 tablet (75 mcg total) by mouth daily. 90 tablet 1   No current facility-administered medications on file prior to visit.

## 2024-12-15 ENCOUNTER — Telehealth: Payer: Self-pay

## 2024-12-15 NOTE — Telephone Encounter (Signed)
 Called and spoke to [place]. She does not qualify for patient assistance. She has [employer] (consulting) and Medicaid. Pt needs Xdemvy 1 drop each eye bid x 6 weeks. Insurance will not accept a rx from the eye doctor. Asking if Dr Bennett would be willing to do the rx. Dr feels this is the best medication to cure the mites.   [facility] [place], [place] ph: [phone]. I have pulled up that pharmacy in her profile.   They are willing to do all of the PA paperwork. Said to send it to them.

## 2024-12-15 NOTE — Telephone Encounter (Signed)
 Copied from CRM #[id number]. Topic: Clinical - Medical Advice >> Dec 15, 2024  9:01 AM Joesph NOVAK wrote: Reason for CRM: Harlene from [facility] is calling in regards to this patient. Patient has mites in both of her eyes and they are wanting to know if Dr.Bowa will prescribe the medication she needs. The medication is $2000 and they are not in network with Medicaid. Since Dr.Bowa is, it may come out cheaper. Harlene is requesting for someone to give her a call today,   [phone]- opt 2 to speak to [place] -

## 2024-12-17 ENCOUNTER — Other Ambulatory Visit: Payer: Self-pay | Admitting: Internal Medicine

## 2024-12-18 MED ORDER — LOTILANER 0.25 % OP SOLN: 1.0000 [drp] | Freq: Two times a day (BID) | OPHTHALMIC | 1 refills | Status: AC

## 2024-12-18 NOTE — Telephone Encounter (Signed)
 See separate phone note, call ctr advise eye dr office Rx was sent and they will send OV notes

## 2024-12-18 NOTE — Telephone Encounter (Signed)
 Copied from CRM #[id number]. Topic: Clinical - Medical Advice >> Dec 15, 2024  9:01 AM Joesph NOVAK wrote: Reason for CRM: Harlene from [facility] is calling in regards to this patient. Patient has mites in both of her eyes and they are wanting to know if Dr.Bowa will prescribe the medication she needs. The medication is $2000 and they are not in network with Medicaid. Since Dr.Bowa is, it may come out cheaper. Harlene is requesting for someone to give her a call today,   PH:[phone]- opt 2 to speak to Harlene - >> Dec 18, 2024 11:51 AM Robinson DEL wrote: Harlene following up on message sent on 12/16, agent advised Harlene of message from provider, states she will verify with Dr. Delories if generic is okay, but needs to confirm the SIG on it for provider and office will fax over office notes.  Harlene [phone] Option 2 in office until 2pm

## 2024-12-18 NOTE — Addendum Note (Signed)
 Addended by: BENNETT REUBEN POUR on: 12/18/2024 07:34 AM   Modules accepted: Orders

## 2024-12-18 NOTE — Telephone Encounter (Signed)
 Please call Harlene from [facility] and update that a generic form of Xdemvy , which is Lotilaner  0.25% eyedrops [facility]  Please request that they fax over patient's most recent progress note to our office for our records.  Thank you

## 2024-12-21 NOTE — Telephone Encounter (Signed)
 Albino Manuelita BROCKS, CMA    12/18/24 12:04 PM Note Copied from CRM #[id number]. Topic: Clinical - Medical Advice >> Dec 15, 2024  9:01 AM Joesph NOVAK wrote: Reason for CRM: Harlene from [facility] is calling in regards to this patient. Patient has mites in both of her eyes and they are wanting to know if Dr.Bowa will prescribe the medication she needs. The medication is $2000 and they are not in network with Medicaid. Since Dr.Bowa is, it may come out cheaper. Harlene is requesting for someone to give her a call today,    [phone]- opt 2 to speak to Harlene - >> Dec 18, 2024 11:51 AM Robinson DEL wrote: Harlene following up on message sent on 12/16, agent advised Harlene of message from provider, states she will verify with Dr. Delories if generic is okay, but needs to confirm the SIG on it for provider and office will fax over office notes.   Harlene [phone] Option 2 in office until 2pm      [facility]ica[facility] to Katrinka Robinson CHRISTELLA JOLYNN    12/18/24 11:42 AM Option 2   12/15/24  8:58 AM

## 2024-12-28 NOTE — Telephone Encounter (Signed)
 Left message for [name] to let us  know if there was anything else that needed to be done for the pt. I wanted to make sure nothing fell through the cracks.

## 2025-01-08 ENCOUNTER — Ambulatory Visit

## 2025-01-11 ENCOUNTER — Other Ambulatory Visit (INDEPENDENT_AMBULATORY_CARE_PROVIDER_SITE_OTHER)

## 2025-01-11 DIAGNOSIS — E2839 Other primary ovarian failure: Secondary | ICD-10-CM | POA: Diagnosis not present

## 2025-01-11 LAB — FOLLICLE STIMULATING HORMONE: FSH: 13.1 m[IU]/mL

## 2025-01-11 NOTE — Addendum Note (Signed)
 Addended by: HOPE VEVA PARAS on: 01/11/2025 09:14 AM   Modules accepted: Orders

## 2025-01-12 LAB — ESTRADIOL: Estradiol: 60 pg/mL

## 2025-01-17 ENCOUNTER — Ambulatory Visit: Payer: Self-pay

## 2025-01-17 DIAGNOSIS — E271 Primary adrenocortical insufficiency: Secondary | ICD-10-CM

## 2025-01-17 DIAGNOSIS — E2839 Other primary ovarian failure: Secondary | ICD-10-CM

## 2025-01-20 LAB — 21-HYDROXYLASE ANTIBODIES: 21-Hydroxylase Antibodies: POSITIVE — AB

## 2025-01-23 NOTE — Telephone Encounter (Signed)
 For the ACTH stimulation test, do I need to order the injection?  Or nothing more is needed from me And can I get the patient scheduled for a lab visit or do we need to wait for the tubes to arrive?

## 2025-01-27 NOTE — Addendum Note (Signed)
 Addended by: Venera Privott K on: 01/27/2025 03:47 PM   Modules accepted: Orders

## 2025-01-27 NOTE — Telephone Encounter (Signed)
 Please call patient and schedule for a lab visit as soon as possible for cortisol level, the lab needs to be drawn at 8 AM and fasting.

## 2025-01-27 NOTE — Telephone Encounter (Signed)
 Hi, this patient who has primary ovarian insufficiency came back positive for 21-hydroxylase antibodies.  We spoke about her in the past and she is scheduled to see you in the coming months.  Trying to work her up for adrenal insufficiency, I am aware that because she is on HRT her a.m. cortisol may not be diagnostic, I have still ordered it though.  However, I would like her to have ACTH stimulated salivary cortisol, we cannot do this lab in my clinic, do have any recommendations on how I can arrange for her to have this test done?  Could it be drawn at your clinic?  Thank you in advance!

## 2025-01-29 ENCOUNTER — Encounter: Payer: Self-pay | Admitting: Internal Medicine

## 2025-01-29 ENCOUNTER — Ambulatory Visit: Admitting: Internal Medicine

## 2025-01-29 ENCOUNTER — Other Ambulatory Visit

## 2025-01-29 VITALS — BP 120/70 | HR 79 | Ht 65.0 in | Wt 214.0 lb

## 2025-01-29 DIAGNOSIS — N83201 Unspecified ovarian cyst, right side: Secondary | ICD-10-CM

## 2025-01-29 DIAGNOSIS — E041 Nontoxic single thyroid nodule: Secondary | ICD-10-CM

## 2025-01-29 DIAGNOSIS — N643 Galactorrhea not associated with childbirth: Secondary | ICD-10-CM

## 2025-01-29 DIAGNOSIS — E271 Primary adrenocortical insufficiency: Secondary | ICD-10-CM | POA: Insufficient documentation

## 2025-01-29 DIAGNOSIS — E063 Autoimmune thyroiditis: Secondary | ICD-10-CM

## 2025-01-29 NOTE — Patient Instructions (Signed)
Please continue Levothyroxine 75 mcg daily.  Take the thyroid hormone every day, with water, at least 30 minutes before breakfast, separated by at least 4 hours from: - acid reflux medications - calcium - iron - multivitamins  Please stop at the lab.  Please return in 1 year. 

## 2025-02-01 ENCOUNTER — Other Ambulatory Visit

## 2025-02-01 ENCOUNTER — Ambulatory Visit

## 2025-02-05 ENCOUNTER — Other Ambulatory Visit: Payer: Self-pay

## 2025-02-05 ENCOUNTER — Ambulatory Visit: Payer: Self-pay | Admitting: Internal Medicine

## 2025-02-05 DIAGNOSIS — E063 Autoimmune thyroiditis: Secondary | ICD-10-CM

## 2025-02-05 LAB — CORTISOL: Cortisol, Plasma: 6 ug/dL

## 2025-02-05 LAB — ACTH: C206 ACTH: 21 pg/mL (ref 6–50)

## 2025-02-05 LAB — BASIC METABOLIC PANEL WITHOUT GFR
BUN: 12 mg/dL (ref 7–25)
CO2: 30 mmol/L (ref 20–32)
Calcium: 9.4 mg/dL (ref 8.6–10.2)
Chloride: 101 mmol/L (ref 98–110)
Creat: 0.67 mg/dL (ref 0.50–0.97)
Glucose, Bld: 81 mg/dL (ref 65–139)
Potassium: 4.4 mmol/L (ref 3.5–5.3)
Sodium: 138 mmol/L (ref 135–146)

## 2025-02-05 LAB — ALDOSTERONE + RENIN ACTIVITY W/ RATIO
ALDO / PRA Ratio: 2.7 ratio (ref 0.9–28.9)
Aldosterone: 9 ng/dL
Renin Activity: 3.38 ng/mL/h (ref 0.25–5.82)

## 2025-02-05 LAB — HEMOGLOBIN A1C
Hgb A1c MFr Bld: 5.2 %
Mean Plasma Glucose: 103 mg/dL
eAG (mmol/L): 5.7 mmol/L

## 2025-02-05 LAB — TSH: TSH: 1.95 m[IU]/L

## 2025-02-05 LAB — T4, FREE: Free T4: 1.2 ng/dL (ref 0.8–1.8)

## 2025-02-05 LAB — CELIAC DISEASE PANEL: Immunoglobulin A: 137 mg/dL (ref 47–310)

## 2025-02-05 NOTE — Addendum Note (Signed)
 Addended by: TRIXIE FILE on: 02/05/2025 12:20 PM   Modules accepted: Orders

## 2025-06-15 ENCOUNTER — Ambulatory Visit: Admitting: Internal Medicine
